# Patient Record
Sex: Female | Born: 1950 | Race: White | Hispanic: No | Marital: Married | Smoking: Never smoker
Health system: Southern US, Community
[De-identification: ages and names within clinical notes are randomized; demographics above are authoritative.]

## PROBLEM LIST (undated history)

## (undated) DIAGNOSIS — K219 Gastro-esophageal reflux disease without esophagitis: Secondary | ICD-10-CM

## (undated) DIAGNOSIS — F419 Anxiety disorder, unspecified: Secondary | ICD-10-CM

## (undated) DIAGNOSIS — H409 Unspecified glaucoma: Secondary | ICD-10-CM

## (undated) DIAGNOSIS — R0602 Shortness of breath: Secondary | ICD-10-CM

## (undated) DIAGNOSIS — E119 Type 2 diabetes mellitus without complications: Secondary | ICD-10-CM

## (undated) DIAGNOSIS — M2550 Pain in unspecified joint: Secondary | ICD-10-CM

## (undated) DIAGNOSIS — K5900 Constipation, unspecified: Secondary | ICD-10-CM

## (undated) DIAGNOSIS — M549 Dorsalgia, unspecified: Secondary | ICD-10-CM

## (undated) DIAGNOSIS — R1310 Dysphagia, unspecified: Secondary | ICD-10-CM

## (undated) DIAGNOSIS — R5383 Other fatigue: Secondary | ICD-10-CM

## (undated) DIAGNOSIS — R002 Palpitations: Secondary | ICD-10-CM

## (undated) DIAGNOSIS — G9332 Myalgic encephalomyelitis/chronic fatigue syndrome: Secondary | ICD-10-CM

## (undated) DIAGNOSIS — R600 Localized edema: Secondary | ICD-10-CM

## (undated) DIAGNOSIS — K589 Irritable bowel syndrome without diarrhea: Secondary | ICD-10-CM

## (undated) DIAGNOSIS — M1990 Unspecified osteoarthritis, unspecified site: Secondary | ICD-10-CM

## (undated) DIAGNOSIS — R5382 Chronic fatigue, unspecified: Secondary | ICD-10-CM

## (undated) HISTORY — DX: Type 2 diabetes mellitus without complications: E11.9

## (undated) HISTORY — DX: Constipation, unspecified: K59.00

## (undated) HISTORY — DX: Localized edema: R60.0

## (undated) HISTORY — DX: Unspecified glaucoma: H40.9

## (undated) HISTORY — DX: Palpitations: R00.2

## (undated) HISTORY — DX: Pain in unspecified joint: M25.50

## (undated) HISTORY — DX: Unspecified osteoarthritis, unspecified site: M19.90

## (undated) HISTORY — DX: Irritable bowel syndrome, unspecified: K58.9

## (undated) HISTORY — DX: Shortness of breath: R06.02

## (undated) HISTORY — DX: Myalgic encephalomyelitis/chronic fatigue syndrome: G93.32

## (undated) HISTORY — DX: Gastro-esophageal reflux disease without esophagitis: K21.9

## (undated) HISTORY — DX: Dysphagia, unspecified: R13.10

## (undated) HISTORY — DX: Anxiety disorder, unspecified: F41.9

## (undated) HISTORY — DX: Dorsalgia, unspecified: M54.9

## (undated) HISTORY — DX: Chronic fatigue, unspecified: R53.82

## (undated) HISTORY — DX: Other fatigue: R53.83

---

## 1988-07-27 HISTORY — PX: GALLBLADDER SURGERY: SHX652

## 1991-02-06 HISTORY — PX: OTHER SURGICAL HISTORY: SHX169

## 2012-04-04 HISTORY — PX: HERNIA REPAIR: SHX51

## 2015-09-09 HISTORY — PX: HERNIA REPAIR: SHX51

## 2017-07-17 ENCOUNTER — Ambulatory Visit (HOSPITAL_BASED_OUTPATIENT_CLINIC_OR_DEPARTMENT_OTHER)
Admission: RE | Admit: 2017-07-17 | Discharge: 2017-07-17 | Disposition: A | Discharge status: Home / Self Care | Payer: 59 | Source: Ambulatory Visit | Attending: Nurse Practitioner | Admitting: Nurse Practitioner

## 2017-07-17 ENCOUNTER — Other Ambulatory Visit (HOSPITAL_BASED_OUTPATIENT_CLINIC_OR_DEPARTMENT_OTHER): Payer: Self-pay | Admitting: Nurse Practitioner

## 2017-07-17 DIAGNOSIS — R600 Localized edema: Secondary | ICD-10-CM | POA: Diagnosis not present

## 2017-07-17 DIAGNOSIS — M79605 Pain in left leg: Secondary | ICD-10-CM | POA: Insufficient documentation

## 2018-03-29 ENCOUNTER — Encounter: Payer: Self-pay | Admitting: Cardiovascular Disease

## 2018-03-29 ENCOUNTER — Ambulatory Visit (INDEPENDENT_AMBULATORY_CARE_PROVIDER_SITE_OTHER): Payer: Medicare Other | Admitting: Cardiovascular Disease

## 2018-03-29 VITALS — BP 152/74 | HR 72 | Ht 64.0 in | Wt 317.0 lb

## 2018-03-29 DIAGNOSIS — I10 Essential (primary) hypertension: Secondary | ICD-10-CM

## 2018-03-29 DIAGNOSIS — R011 Cardiac murmur, unspecified: Secondary | ICD-10-CM | POA: Diagnosis not present

## 2018-03-29 DIAGNOSIS — I6522 Occlusion and stenosis of left carotid artery: Secondary | ICD-10-CM | POA: Diagnosis not present

## 2018-03-29 DIAGNOSIS — E785 Hyperlipidemia, unspecified: Secondary | ICD-10-CM | POA: Diagnosis not present

## 2018-03-29 NOTE — Progress Notes (Signed)
Cardiology Office Note   Date:  03/29/2018   ID:  Asyria, Kolander 12-02-1950, MRN [id number]  PCP:  Hal Morales, NP  Cardiologist:   Lorine Bears, MD   Chief Complaint  Patient presents with  . New Patient (Initial Visit)      History of Present Illness: Bethany Lynch is a 67 y.o. female who was referred by [facility] for evaluation of carotid disease. She has multiple chronic medical conditions that include morbid obesity, diabetes mellitus since she was 67 years old, essential hypertension, hyperlipidemia and chronic leg edema likely due to venous insufficiency.  She reports that her father had coronary and carotid disease but he died of leukemia. She had previous cardiac work-up done for a cardiac murmur and bradycardia many years ago but no recent evaluation.  She also reports having a nuclear stress test in the remote past which was unremarkable. She was noted to have left carotid bruit. Carotid Doppler done earlier this month showed moderate left carotid stenosis estimated to be 50 to 69% with  velocity of 138 over 16 cm/s.  there was minimal disease on the right side. She reports dyspnea with minimal activities and occasional substernal chest tightness.  No previous history of stroke or TIA..    Current Outpatient Medications  Medication Sig Dispense Refill  . dicyclomine (BENTYL) 20 MG tablet Take 1 tablet by mouth 2 (two) times daily.  1  . FARXIGA 10 MG TABS tablet Take 10 mg by mouth daily.  1  . furosemide (LASIX) 20 MG tablet Take 20 mg by mouth daily.  3  . hydrALAZINE (APRESOLINE) 50 MG tablet Take 1 tablet by mouth 3 (three) times daily.  1  . latanoprost (XALATAN) 0.005 % ophthalmic solution Place 1 drop into both eyes at bedtime.    Marland Kitchen LEVEMIR FLEXTOUCH 100 UNIT/ML Pen Inject 80 Units into the skin at bedtime.  1  . LORazepam (ATIVAN) 0.5 MG tablet TAKE 1 TABLET TWICE A DAY AS NEEDED FOR ANXIETY.  2  . losartan (COZAAR) 100 MG tablet Take 100 mg by mouth  daily.  1  . metFORMIN (GLUCOPHAGE) 1000 MG tablet TAKE 1 TABLET TWICE A DAY FOR DIABETES.  2  . NOVOLOG FLEXPEN 100 UNIT/ML FlexPen Inject 22 Units into the muscle daily at 6 PM.  1  . simvastatin (ZOCOR) 10 MG tablet TAKE 1 TABLET EVERY NIGHT AT BEDTIME FOR CHOLESTEROL  1  . verapamil (CALAN-SR) 240 MG CR tablet Take 240 mg by mouth 2 (two) times daily.  2   No current facility-administered medications for this visit.     Allergies:   Patient has no allergy information on record.    Social History:  The patient  reports that she has never smoked. She has never used smokeless tobacco.   Family History:  The patient's family history includes Colon cancer in her mother; Diabetes in her mother; Leukemia in her father; Memory loss in her mother; Stroke in her father.    ROS:  Please see the history of present illness.   Otherwise, review of systems are positive for none.   All other systems are reviewed and negative.    PHYSICAL EXAM: VS:  BP (!) 152/74   Pulse 72   Ht  (1.626 m)   Wt (!) 317 lb (143.8 kg)   BMI 54.41 kg/m  , BMI Body mass index is 54.41 kg/m. GEN: Well nourished, well developed, in no acute distress  HEENT: normal  Neck: no JVD,  or masses.  Left carotid bruit  Cardiac: RRR; no  rubs, or gallops.  2 out of 6 crescendo decrescendo systolic murmur in the aortic area which is early peaking.  Moderate bilateral leg edema. Respiratory:  clear to auscultation bilaterally, normal work of breathing GI: soft, nontender, nondistended, + BS MS: no deformity or atrophy  Skin: warm and dry, no rash Neuro:  Strength and sensation are intact Psych: euthymic mood, full affect   EKG:  EKG is ordered today. The ekg ordered today demonstrates sinus rhythm, LVH with repolarization abnormalities.   Recent Labs: No results found for requested labs within last 8760 hours.    Lipid Panel No results found for: CHOL, TRIG, HDL, CHOLHDL, VLDL, LDLCALC, LDLDIRECT    Wt  Readings from Last 3 Encounters:  03/29/18 (!) 317 lb (143.8 kg)       PAD Screen 03/29/2018  Previous PAD dx? No  Previous surgical procedure? No  Pain with walking? Yes  Subsides with rest? No  Feet/toe relief with dangling? No  Painful, non-healing ulcers? No  Extremities discolored? No      ASSESSMENT AND PLAN:  1.  Moderate left carotid stenosis: Asymptomatic.  I recommend aggressive treatment of risk factors and low-dose aspirin.  Revascularization might be indicated once stenosis is above 80%.  I am going to repeat carotid Doppler in our office in 1 year and monitor this on a yearly basis.  2.  Cardiac murmur suggestive of aortic stenosis with no recent evaluation.  I requested an echocardiogram.  The patient has significant exertional dyspnea which I suspect is partially due to physical deconditioning.  3.  Essential hypertension: Blood pressure is not optimally controlled.  Can consider adding spironolactone if she has no issues with chronic kidney disease or hyperkalemia.  4.  Hyperlipidemia: Given diabetes and carotid disease, I recommend a target LDL of less than 70.  Due to interaction with verapamil, simvastatin cannot be increased and thus should consider switching to rosuvastatin or atorvastatin.    Disposition:   FU with me in 1 year  Signed,  Lorine Bears, MD  03/29/2018 10:57 AM    [facility]

## 2018-03-29 NOTE — Patient Instructions (Addendum)
Medication Instructions: Your physician recommends that you continue on your current medications as directed. Please refer to the Current Medication list given to you today.  If you need a refill on your cardiac medications before your next appointment, please call your pharmacy.   Procedures/Testing: Your physician has requested that you have an echocardiogram in the next few weeks. Echocardiography is a painless test that uses sound waves to create images of your heart. It provides your doctor with information about the size and shape of your heart and how well your heart's chambers and valves are working. This procedure takes approximately one hour. There are no restrictions for this procedure. This will take place at [address].  Your physician has requested that you have a carotid duplex in 12 months. This test is an ultrasound of the carotid arteries in your neck. It looks at blood flow through these arteries that supply the brain with blood. Allow one hour for this exam. There are no restrictions or special instructions. This will take place at [address], [address].    Follow-Up: Your physician wants you to follow-up in 12 months with Dr. Kirke Corin. You will receive a reminder letter in the mail two months in advance. If you don't receive a letter, please call our office at [phone] to schedule this follow-up appointment.    Thank you for choosing Heart[facility] at [facility]!!

## 2018-04-06 ENCOUNTER — Ambulatory Visit (HOSPITAL_COMMUNITY): Payer: Medicare Other | Attending: Cardiology

## 2018-04-06 ENCOUNTER — Other Ambulatory Visit: Payer: Self-pay

## 2018-04-06 DIAGNOSIS — I119 Hypertensive heart disease without heart failure: Secondary | ICD-10-CM | POA: Diagnosis not present

## 2018-04-06 DIAGNOSIS — R011 Cardiac murmur, unspecified: Secondary | ICD-10-CM | POA: Insufficient documentation

## 2018-04-06 DIAGNOSIS — E785 Hyperlipidemia, unspecified: Secondary | ICD-10-CM | POA: Diagnosis not present

## 2018-04-06 DIAGNOSIS — I350 Nonrheumatic aortic (valve) stenosis: Secondary | ICD-10-CM | POA: Diagnosis not present

## 2018-04-06 DIAGNOSIS — E119 Type 2 diabetes mellitus without complications: Secondary | ICD-10-CM | POA: Diagnosis not present

## 2018-09-28 ENCOUNTER — Ambulatory Visit (INDEPENDENT_AMBULATORY_CARE_PROVIDER_SITE_OTHER): Payer: Medicare Other | Admitting: Cardiology

## 2018-09-28 ENCOUNTER — Encounter: Payer: Self-pay | Admitting: Cardiology

## 2018-09-28 VITALS — BP 146/74 | HR 86 | Ht 64.0 in | Wt 321.4 lb

## 2018-09-28 DIAGNOSIS — I152 Hypertension secondary to endocrine disorders: Secondary | ICD-10-CM | POA: Insufficient documentation

## 2018-09-28 DIAGNOSIS — R0602 Shortness of breath: Secondary | ICD-10-CM | POA: Insufficient documentation

## 2018-09-28 DIAGNOSIS — I6522 Occlusion and stenosis of left carotid artery: Secondary | ICD-10-CM

## 2018-09-28 DIAGNOSIS — R011 Cardiac murmur, unspecified: Secondary | ICD-10-CM | POA: Diagnosis not present

## 2018-09-28 DIAGNOSIS — I10 Essential (primary) hypertension: Secondary | ICD-10-CM

## 2018-09-28 DIAGNOSIS — R0609 Other forms of dyspnea: Secondary | ICD-10-CM

## 2018-09-28 DIAGNOSIS — E041 Nontoxic single thyroid nodule: Secondary | ICD-10-CM

## 2018-09-28 DIAGNOSIS — E1159 Type 2 diabetes mellitus with other circulatory complications: Secondary | ICD-10-CM | POA: Insufficient documentation

## 2018-09-28 DIAGNOSIS — I350 Nonrheumatic aortic (valve) stenosis: Secondary | ICD-10-CM

## 2018-09-28 DIAGNOSIS — E785 Hyperlipidemia, unspecified: Secondary | ICD-10-CM

## 2018-09-28 HISTORY — DX: Essential (primary) hypertension: I10

## 2018-09-28 HISTORY — DX: Shortness of breath: R06.02

## 2018-09-28 HISTORY — DX: Hyperlipidemia, unspecified: E78.5

## 2018-09-28 HISTORY — DX: Occlusion and stenosis of left carotid artery: I65.22

## 2018-09-28 NOTE — Patient Instructions (Signed)
Medication Instructions:  Your physician recommends that you continue on your current medications as directed. Please refer to the Current Medication list given to you today.  If you need a refill on your cardiac medications before your next appointment, please call your pharmacy.   Lab work: Your physician recommends that you return for lab work today: BMP, TSH, BNP   If you have labs (blood work) drawn today and your tests are completely normal, you will receive your results only by: Marland Kitchen MyChart Message (if you have MyChart) OR . A paper copy in the mail If you have any lab test that is abnormal or we need to change your treatment, we will call you to review the results.  Testing/Procedures: Your physician has requested that you have a carotid duplex. This test is an ultrasound of the carotid arteries in your neck. It looks at blood flow through these arteries that supply the brain with blood. Allow one hour for this exam. There are no restrictions or special instructions.    Follow-Up: At [facility], you and your health needs are our priority.  As part of our continuing mission to provide you with exceptional heart care, we have created designated Provider Care Teams.  These Care Teams include your primary Cardiologist (physician) and Advanced Practice Providers (APPs -  Physician Assistants and Nurse Practitioners) who all work together to provide you with the care you need, when you need it. You will need a follow up appointment in 3 months.  Please call our office 2 months in advance to schedule this appointment.  You may see No primary care provider on file. or another member of our [employer] Provider Team in [place]: Norman Herrlich, MD . Belva Crome, MD  Any Other Special Instructions Will Be Listed Below (If Applicable).

## 2018-09-28 NOTE — Progress Notes (Signed)
Cardiology Office Note:    Date:  09/28/2018   ID:  Bethany Lynch, DOB 02/06/1951, MRN [id number]  PCP:  Hal Morales, NP  Cardiologist:  Gypsy Balsam, MD    Referring MD: Hal Morales, NP   Chief Complaint  Patient presents with  . Follow-up  I have shortness of breath  History of Present Illness:    Bethany Lynch is a 67 y.o. female that I had a pleasure to take her a few years ago.  Her past medical history is quite complex.  She does have diabetes also morbid obesity lymphedema dyspnea on exertion chief complaint this time is shortness of breath she said anytime she tried to do think she will get short of breath very easily.  In the spring time she did see our partner Dr. Benard Rink with consideration of carotid arterial disease no intervention was required echocardiogram was ordered time she was find to have mild aortic stenosis normal left ventricular ejection fraction.  She does have swelling of lower extremities which majority of it is lymphedema she is being helpful for that with some massage therapy.  That happens about once a month and it helps a great deal.  No past medical history on file.  Past Surgical History:  Procedure Laterality Date  . GALLBLADDER SURGERY  07/27/1988  . HERNIA REPAIR  04/04/2012  . HERNIA REPAIR  09/09/2015  . HYESTERECTOMY  02/06/1991    Current Medications: Current Meds  Medication Sig  . aspirin 81 MG chewable tablet Chew by mouth daily.  . B Complex-C (B-COMPLEX WITH VITAMIN C) tablet Take 1 tablet by mouth daily.  . cetirizine (ZYRTEC ALLERGY) 10 MG tablet Take 10 mg by mouth daily.  . Cholecalciferol (D3 HIGH POTENCY) 2000 units CAPS Take by mouth daily as needed.  . Cranberry 200 MG CAPS Take 1 capsule by mouth daily as needed.  . dicyclomine (BENTYL) 20 MG tablet Take 1 tablet by mouth 2 (two) times daily.  Marland Kitchen FARXIGA 10 MG TABS tablet Take 10 mg by mouth daily.  . furosemide (LASIX) 20 MG tablet Take 20 mg by mouth daily.  .  Homeopathic Products (YEAST-GARD ADV HOMEOPATHIC PO) Take by mouth as needed.  . hydrALAZINE (APRESOLINE) 50 MG tablet Take 1 tablet by mouth 3 (three) times daily.  Marland Kitchen latanoprost (XALATAN) 0.005 % ophthalmic solution Place 1 drop into both eyes at bedtime.  Marland Kitchen LORazepam (ATIVAN) 0.5 MG tablet TAKE 1 TABLET TWICE A DAY AS NEEDED FOR ANXIETY.  Marland Kitchen losartan (COZAAR) 100 MG tablet Take 100 mg by mouth daily.  . metFORMIN (GLUCOPHAGE) 1000 MG tablet TAKE 1 TABLET TWICE A DAY FOR DIABETES.  . Misc Natural Products (TART CHERRY ADVANCED PO) Take by mouth daily as needed.  . Multiple Vitamins-Minerals (VISION PLUS PO) Take by mouth daily as needed.  . NON FORMULARY ORGANIC TUMERIC CURCUMIN  WITH GINGER AND BIOPERINE DIETARY SUPPLEMENT TAKE AS NEEDED  . NOVOLOG FLEXPEN 100 UNIT/ML FlexPen Inject 22 Units into the muscle daily at 6 PM.  . Pediatric Multivitamins-Fl (MULTIVITAMIN DROPS/FLUORIDE PO) Take 1 tablet by mouth daily.  . simvastatin (ZOCOR) 10 MG tablet TAKE 1 TABLET EVERY NIGHT AT BEDTIME FOR CHOLESTEROL  . verapamil (CALAN-SR) 240 MG CR tablet Take 240 mg by mouth 2 (two) times daily.  . vitamin C (ASCORBIC ACID) 500 MG tablet Take 500 mg by mouth daily.  . vitamin E 400 UNIT capsule Take 400 Units by mouth daily.     Allergies:   Victoza [liraglutide]  Social History   Socioeconomic History  . Marital status: Married    Spouse name: Not on file  . Number of children: Not on file  . Years of education: Not on file  . Highest education level: Not on file  Occupational History  . Not on file  Social Needs  . Financial resource strain: Not on file  . Food insecurity:    Worry: Not on file    Inability: Not on file  . Transportation needs:    Medical: Not on file    Non-medical: Not on file  Tobacco Use  . Smoking status: Never Smoker  . Smokeless tobacco: Never Used  Substance and Sexual Activity  . Alcohol use: Not on file  . Drug use: Not on file  . Sexual activity: Not on  file  Lifestyle  . Physical activity:    Days per week: Not on file    Minutes per session: Not on file  . Stress: Not on file  Relationships  . Social connections:    Talks on phone: Not on file    Gets together: Not on file    Attends religious service: Not on file    Active member of club or organization: Not on file    Attends meetings of clubs or organizations: Not on file    Relationship status: Not on file  Other Topics Concern  . Not on file  Social History Narrative  . Not on file     Family History: The patient's family history includes Colon cancer in her mother; Diabetes in her mother; Leukemia in her father; Memory loss in her mother; Stroke in her father. ROS:   Please see the history of present illness.    All 14 point review of systems negative except as described per history of present illness  EKGs/Labs/Other Studies Reviewed:      Recent Labs: No results found for requested labs within last 8760 hours.  Recent Lipid Panel No results found for: CHOL, TRIG, HDL, CHOLHDL, VLDL, LDLCALC, LDLDIRECT  Physical Exam:    VS:  BP (!) 146/74   Pulse 86   Ht 5\' 4"  (1.626 m)   Wt (!) 321 lb 6.4 oz (145.8 kg)   SpO2 97%   BMI 55.17 kg/m     Wt Readings from Last 3 Encounters:  09/28/18 (!) 321 lb 6.4 oz (145.8 kg)  03/29/18 (!) 317 lb (143.8 kg)     GEN:  Well nourished, well developed in no acute distress HEENT: Normal NECK: No JVD; No carotid bruits LYMPHATICS: No lymphadenopathy CARDIAC: RRR, systolic ejection murmur grade 1/6 to 2/6 best heard at the right upper portion of the sternum, no rubs, no gallops RESPIRATORY:  Clear to auscultation without rales, wheezing or rhonchi  ABDOMEN: Soft, non-tender, non-distended MUSCULOSKELETAL:  No edema; No deformity  SKIN: Warm and dry LOWER EXTREMITIES: no swelling NEUROLOGIC:  Alert and oriented x 3 PSYCHIATRIC:  Normal affect   ASSESSMENT:    1. Essential hypertension   2. Hyperlipidemia,  unspecified hyperlipidemia type   3. Stenosis of left carotid artery   4. Murmur   5. Dyspnea on exertion   6. Thyroid nodule   7. Shortness of breath   8. Nonrheumatic aortic valve stenosis    PLAN:    In order of problems listed above:  1. Essential hypertension blood pressure appears to be controlled continue present management. 2. Dyspnea on exertion undoubtedly multifactorial her left ventricular ejection fraction showed preserved left ventricular ejection  fraction but I will ask you to have proBNP as well as Chem-7 done try to assess the situation and see if there is anything I can help with giving her some diuretic. 3. Nonrheumatic aortic stenosis only mild insignificant for now obviously required.  Recheck by echocardiogram. 4. Chronic lymphedema has been followed by massage therapy here and aspirin and it seems to be helping a great deal she also use elastic stockings which helps. 5. Dyslipidemia she takes small dose of simvastatin only 10 mg this is not sufficient I will switch her to Crestor 20 mg daily.   Medication Adjustments/Labs and Tests Ordered: Current medicines are reviewed at length with the patient today.  Concerns regarding medicines are outlined above.  Orders Placed This Encounter  Procedures  . Basic metabolic panel  . TSH  . Pro b natriuretic peptide (BNP)   Medication changes: No orders of the defined types were placed in this encounter.   Signed, Georgeanna Lea, MD, [facility] 09/28/2018 12:06 PM    [facility]

## 2018-09-29 LAB — BASIC METABOLIC PANEL
BUN/Creatinine Ratio: 25 (ref 12–28)
BUN: 19 mg/dL (ref 8–27)
CO2: 26 mmol/L (ref 20–29)
Calcium: 10.6 mg/dL — ABNORMAL HIGH (ref 8.7–10.3)
Chloride: 98 mmol/L (ref 96–106)
Creatinine, Ser: 0.75 mg/dL (ref 0.57–1.00)
GFR calc Af Amer: 97 mL/min/{1.73_m2} (ref >59)
GFR calc non Af Amer: 84 mL/min/{1.73_m2} (ref >59)
GLUCOSE: 91 mg/dL (ref 65–99)
Potassium: 4.8 mmol/L (ref 3.5–5.2)
SODIUM: 141 mmol/L (ref 134–144)

## 2018-09-29 LAB — TSH: TSH: 2.83 u[IU]/mL (ref 0.450–4.500)

## 2018-09-29 LAB — PRO B NATRIURETIC PEPTIDE: NT-PRO BNP: 79 pg/mL (ref 0–301)

## 2018-10-03 ENCOUNTER — Ambulatory Visit (INDEPENDENT_AMBULATORY_CARE_PROVIDER_SITE_OTHER): Payer: Medicare Other

## 2018-10-03 DIAGNOSIS — I10 Essential (primary) hypertension: Secondary | ICD-10-CM

## 2018-10-03 DIAGNOSIS — E785 Hyperlipidemia, unspecified: Secondary | ICD-10-CM

## 2018-10-03 DIAGNOSIS — R011 Cardiac murmur, unspecified: Secondary | ICD-10-CM | POA: Diagnosis not present

## 2018-10-03 DIAGNOSIS — I6522 Occlusion and stenosis of left carotid artery: Secondary | ICD-10-CM

## 2018-10-03 NOTE — Progress Notes (Signed)
Carotid artery duplex exam has been performed. Increased velocities were seen in the common carotid and subclavian arteries bilaterally. Mild left ICA stenosis was seen.  Jimmy Niasia Lanphear RDCS, RVT

## 2018-10-04 ENCOUNTER — Telehealth: Payer: Self-pay | Admitting: Emergency Medicine

## 2018-10-04 ENCOUNTER — Telehealth: Payer: Self-pay

## 2018-10-04 DIAGNOSIS — R0602 Shortness of breath: Secondary | ICD-10-CM

## 2018-10-04 DIAGNOSIS — I10 Essential (primary) hypertension: Secondary | ICD-10-CM

## 2018-10-04 MED ORDER — POTASSIUM CHLORIDE ER 10 MEQ PO TBCR: 10.0000 meq | EXTENDED_RELEASE_TABLET | Freq: Every day | ORAL | 0 refills | Status: DC

## 2018-10-04 MED ORDER — FUROSEMIDE 40 MG PO TABS: 40.0000 mg | ORAL_TABLET | Freq: Every day | ORAL | 0 refills | Status: DC

## 2018-10-04 NOTE — Telephone Encounter (Signed)
Patient returned call for lab results at which results were given and she was notified to increase her furosemide to 40 mg and to start potassium 10 meq. She will come by the office to have repeat blood work in a week.  She was also questioning if her Simvastatin was going to be changed to crestor as discussed at her office visit. I advised I would call her back and let her know.

## 2018-10-04 NOTE — Telephone Encounter (Signed)
Patient informed of results and medication changes per Carollee Sires. CMA

## 2018-10-04 NOTE — Telephone Encounter (Signed)
Left message for patient to return call.

## 2018-10-05 MED ORDER — ROSUVASTATIN CALCIUM 10 MG PO TABS: 10.0000 mg | ORAL_TABLET | Freq: Every day | ORAL | 1 refills | Status: DC

## 2018-10-05 NOTE — Telephone Encounter (Signed)
Yes, switch simva to crestor 10 mg po qd

## 2018-10-05 NOTE — Telephone Encounter (Signed)
Patient notified of changing Simvastatin to Crestor.

## 2018-10-07 ENCOUNTER — Telehealth: Payer: Self-pay | Admitting: Emergency Medicine

## 2018-10-07 NOTE — Telephone Encounter (Signed)
Left message for patient to return call regarding results  

## 2018-10-18 ENCOUNTER — Other Ambulatory Visit: Payer: Self-pay

## 2018-10-18 ENCOUNTER — Telehealth: Payer: Self-pay | Admitting: Emergency Medicine

## 2018-10-18 DIAGNOSIS — I10 Essential (primary) hypertension: Secondary | ICD-10-CM

## 2018-10-18 DIAGNOSIS — R0602 Shortness of breath: Secondary | ICD-10-CM

## 2018-10-18 NOTE — Telephone Encounter (Signed)
Patient reminded to have lab work drawn, she states she will come today.

## 2018-10-19 LAB — BASIC METABOLIC PANEL
BUN/Creatinine Ratio: 34 — ABNORMAL HIGH (ref 12–28)
BUN: 26 mg/dL (ref 8–27)
CALCIUM: 10 mg/dL (ref 8.7–10.3)
CHLORIDE: 100 mmol/L (ref 96–106)
CO2: 21 mmol/L (ref 20–29)
Creatinine, Ser: 0.76 mg/dL (ref 0.57–1.00)
GFR calc non Af Amer: 83 mL/min/{1.73_m2} (ref >59)
GFR, EST AFRICAN AMERICAN: 95 mL/min/{1.73_m2} (ref >59)
GLUCOSE: 130 mg/dL — AB (ref 65–99)
Potassium: 4.6 mmol/L (ref 3.5–5.2)
Sodium: 140 mmol/L (ref 134–144)

## 2018-10-20 ENCOUNTER — Telehealth: Payer: Self-pay | Admitting: Emergency Medicine

## 2018-10-20 DIAGNOSIS — I10 Essential (primary) hypertension: Secondary | ICD-10-CM

## 2018-10-20 NOTE — Telephone Encounter (Signed)
Patient informed of lab results and to have labs redrawn in one week. She verbally understands

## 2018-11-02 ENCOUNTER — Encounter: Payer: Self-pay | Admitting: Cardiology

## 2018-12-09 ENCOUNTER — Other Ambulatory Visit: Payer: Self-pay | Admitting: Cardiology

## 2019-01-02 ENCOUNTER — Encounter: Payer: Self-pay | Admitting: Cardiology

## 2019-01-02 ENCOUNTER — Ambulatory Visit (INDEPENDENT_AMBULATORY_CARE_PROVIDER_SITE_OTHER): Payer: Medicare Other | Admitting: Cardiology

## 2019-01-02 VITALS — BP 122/70 | HR 68 | Ht 64.0 in | Wt 321.8 lb

## 2019-01-02 DIAGNOSIS — I890 Lymphedema, not elsewhere classified: Secondary | ICD-10-CM

## 2019-01-02 DIAGNOSIS — E782 Mixed hyperlipidemia: Secondary | ICD-10-CM | POA: Diagnosis not present

## 2019-01-02 DIAGNOSIS — I6522 Occlusion and stenosis of left carotid artery: Secondary | ICD-10-CM

## 2019-01-02 DIAGNOSIS — R0602 Shortness of breath: Secondary | ICD-10-CM | POA: Diagnosis not present

## 2019-01-02 DIAGNOSIS — I10 Essential (primary) hypertension: Secondary | ICD-10-CM | POA: Diagnosis not present

## 2019-01-02 HISTORY — DX: Lymphedema, not elsewhere classified: I89.0

## 2019-01-02 NOTE — Patient Instructions (Signed)
Medication Instructions:  Your physician recommends that you continue on your current medications as directed. Please refer to the Current Medication list given to you today.  If you need a refill on your cardiac medications before your next appointment, please call your pharmacy.   Lab work: None.  If you have labs (blood work) drawn today and your tests are completely normal, you will receive your results only by: . MyChart Message (if you have MyChart) OR . A paper copy in the mail If you have any lab test that is abnormal or we need to change your treatment, we will call you to review the results.  Testing/Procedures: None.   Follow-Up: At [facility], you and your health needs are our priority.  As part of our continuing mission to provide you with exceptional heart care, we have created designated Provider Care Teams.  These Care Teams include your primary Cardiologist (physician) and Advanced Practice Providers (APPs -  Physician Assistants and Nurse Practitioners) who all work together to provide you with the care you need, when you need it. You will need a follow up appointment in 5 months.  Please call our office 2 months in advance to schedule this appointment.  You may see No primary care provider on file. or another member of our [facility] Provider Team in [place]: Brian Munley, MD . Rajan Revankar, MD  Any Other Special Instructions Will Be Listed Below (If Applicable).    

## 2019-01-02 NOTE — Progress Notes (Signed)
Cardiology Office Note:    Date:  01/02/2019   ID:  Bethany Lynch, DOB 02-14-1951, MRN [id number]  PCP:  Hal Morales, NP  Cardiologist:  Gypsy Balsam, MD    Referring MD: Hal Morales, NP   Chief Complaint  Patient presents with  . Follow-up  Doing well  History of Present Illness:    Bethany Lynch is a 68 y.o. female doing well cardiac wise denies have any chest pain tightness squeezing pressure burning chest.  Still had swelling of lower extremities this is lymphedema she gets massage once a month which helps tremendously.  Denies having chest pain tightness squeezing pressure mentions does complain of having some headaches sometimes she does have glaucoma she is scheduled to see her eye doctor within the next week and I strongly recommend to continue with follow-up she need to have her pressure in her eye checked.  She checked her blood pressure when she was having headache and blood pressure was normal 118/60.  Her hemoglobin A1c improved last #6.3.  There is some changes in her medication which seems to be helping.  Last fasting lipid profile I have is from November and is very good.  No past medical history on file.  Past Surgical History:  Procedure Laterality Date  . GALLBLADDER SURGERY  07/27/1988  . HERNIA REPAIR  04/04/2012  . HERNIA REPAIR  09/09/2015  . HYESTERECTOMY  02/06/1991    Current Medications: Current Meds  Medication Sig  . aspirin 81 MG chewable tablet Chew by mouth daily.  . B Complex-C (B-COMPLEX WITH VITAMIN C) tablet Take 1 tablet by mouth daily.  . cetirizine (ZYRTEC ALLERGY) 10 MG tablet Take 10 mg by mouth daily.  . Cholecalciferol (D3 HIGH POTENCY) 2000 units CAPS Take by mouth daily as needed.  . Cranberry 200 MG CAPS Take 1 capsule by mouth daily as needed.  . dicyclomine (BENTYL) 20 MG tablet Take 1 tablet by mouth 2 (two) times daily.  Marland Kitchen FARXIGA 10 MG TABS tablet Take 10 mg by mouth daily.  . furosemide (LASIX) 40 MG tablet Take 1  tablet (40 mg total) by mouth daily.  . Homeopathic Products (YEAST-GARD ADV HOMEOPATHIC PO) Take by mouth as needed.  . hydrALAZINE (APRESOLINE) 50 MG tablet Take 1 tablet by mouth 3 (three) times daily.  Marland Kitchen latanoprost (XALATAN) 0.005 % ophthalmic solution Place 1 drop into both eyes at bedtime.  Marland Kitchen LORazepam (ATIVAN) 0.5 MG tablet TAKE 1 TABLET TWICE A DAY AS NEEDED FOR ANXIETY.  Marland Kitchen losartan (COZAAR) 100 MG tablet Take 100 mg by mouth daily.  . metFORMIN (GLUCOPHAGE) 1000 MG tablet TAKE 1 TABLET TWICE A DAY FOR DIABETES.  . Misc Natural Products (TART CHERRY ADVANCED PO) Take by mouth daily as needed.  . Multiple Vitamins-Minerals (VISION PLUS PO) Take by mouth daily as needed.  . NON FORMULARY ORGANIC TUMERIC CURCUMIN  WITH GINGER AND BIOPERINE DIETARY SUPPLEMENT TAKE AS NEEDED  . NOVOLOG FLEXPEN 100 UNIT/ML FlexPen Inject 22 Units into the muscle 3 (three) times daily with meals.   . Pediatric Multivitamins-Fl (MULTIVITAMIN DROPS/FLUORIDE PO) Take 1 tablet by mouth daily.  . potassium chloride (K-DUR) 10 MEQ tablet TAKE 1 TABLET BY MOUTH EVERY DAY  . rosuvastatin (CRESTOR) 10 MG tablet Take 1 tablet (10 mg total) by mouth daily.  . TRESIBA FLEXTOUCH 200 UNIT/ML SOPN Inject 80 Units into the skin at bedtime.  . verapamil (CALAN-SR) 240 MG CR tablet Take 240 mg by mouth 2 (two) times daily.  Marland Kitchen  vitamin C (ASCORBIC ACID) 500 MG tablet Take 500 mg by mouth daily.  . vitamin E 400 UNIT capsule Take 400 Units by mouth daily.     Allergies:   Victoza [liraglutide]   Social History   Socioeconomic History  . Marital status: Married    Spouse name: Not on file  . Number of children: Not on file  . Years of education: Not on file  . Highest education level: Not on file  Occupational History  . Not on file  Social Needs  . Financial resource strain: Not on file  . Food insecurity:    Worry: Not on file    Inability: Not on file  . Transportation needs:    Medical: Not on file     Non-medical: Not on file  Tobacco Use  . Smoking status: Never Smoker  . Smokeless tobacco: Never Used  Substance and Sexual Activity  . Alcohol use: Not on file  . Drug use: Not on file  . Sexual activity: Not on file  Lifestyle  . Physical activity:    Days per week: Not on file    Minutes per session: Not on file  . Stress: Not on file  Relationships  . Social connections:    Talks on phone: Not on file    Gets together: Not on file    Attends religious service: Not on file    Active member of club or organization: Not on file    Attends meetings of clubs or organizations: Not on file    Relationship status: Not on file  Other Topics Concern  . Not on file  Social History Narrative  . Not on file     Family History: The patient's family history includes Colon cancer in her mother; Diabetes in her mother; Leukemia in her father; Memory loss in her mother; Stroke in her father. ROS:   Please see the history of present illness.    All 14 point review of systems negative except as described per history of present illness  EKGs/Labs/Other Studies Reviewed:      Recent Labs: 09/28/2018: NT-Pro BNP 79; TSH 2.830 10/18/2018: BUN 26; Creatinine, Ser 0.76; Potassium 4.6; Sodium 140  Recent Lipid Panel No results found for: CHOL, TRIG, HDL, CHOLHDL, VLDL, LDLCALC, LDLDIRECT  Physical Exam:    VS:  BP 122/70   Pulse 68   Ht 5\' 4"  (1.626 m)   Wt (!) 321 lb 12.8 oz (146 kg)   SpO2 98%   BMI 55.24 kg/m     Wt Readings from Last 3 Encounters:  01/02/19 (!) 321 lb 12.8 oz (146 kg)  09/28/18 (!) 321 lb 6.4 oz (145.8 kg)  03/29/18 (!) 317 lb (143.8 kg)     GEN:  Well nourished, well developed in no acute distress HEENT: Normal NECK: No JVD; No carotid bruits LYMPHATICS: No lymphadenopathy CARDIAC: RRR, no murmurs, no rubs, no gallops RESPIRATORY:  Clear to auscultation without rales, wheezing or rhonchi  ABDOMEN: Soft, non-tender, non-distended MUSCULOSKELETAL:  No  edema; No deformity  SKIN: Warm and dry LOWER EXTREMITIES: no swelling NEUROLOGIC:  Alert and oriented x 3 PSYCHIATRIC:  Normal affect   ASSESSMENT:    1. Stenosis of left carotid artery   2. Essential hypertension   3. Shortness of breath   4. Mixed hyperlipidemia   5. Lymphedema of both lower extremities    PLAN:    In order of problems listed above:  1. Stenosis of the left carotid artery recent  carotid ultrasounds did not show any critical lesions we will continue risk factors modifications. 2. Essential hypertension blood pressure well controlled we will continue present management. 3. Shortness of breath last echocardiogram showed preserved left ventricular ejection fraction.  Clearly a part of the problem is obesity.  I strongly encouraged her to exercise on a regular basis which she will try to do. 4. Dyslipidemia last fasting improvement lipid profile good 5. Lymphedema follow-up by internal medicine.  She does have massage of her lower extremities and doing well from that point review   Medication Adjustments/Labs and Tests Ordered: Current medicines are reviewed at length with the patient today.  Concerns regarding medicines are outlined above.  No orders of the defined types were placed in this encounter.  Medication changes: No orders of the defined types were placed in this encounter.   Signed, Georgeanna Lea, MD, [facility] 01/02/2019 9:51 AM    [facility]

## 2019-03-04 ENCOUNTER — Other Ambulatory Visit: Payer: Self-pay | Admitting: Cardiology

## 2019-06-05 ENCOUNTER — Other Ambulatory Visit: Payer: Self-pay | Admitting: Cardiology

## 2019-06-05 NOTE — Telephone Encounter (Signed)
°*  STAT* If patient is at the pharmacy, call can be transferred to refill team.   1. Which medications need to be refilled? (please list name of each medication and dose if known) potassium cl er 45meq tablet  2. Which pharmacy/location (including street and city if local pharmacy) is medication to be sent to? [facility] [address] [place]  3. Do they need a 30 day or 90 day supply? [place]

## 2019-06-06 MED ORDER — POTASSIUM CHLORIDE ER 10 MEQ PO TBCR: 10.0000 meq | EXTENDED_RELEASE_TABLET | Freq: Every day | ORAL | 0 refills | Status: DC

## 2019-06-06 NOTE — Telephone Encounter (Signed)
[facility] ref[facility] to [facility] [address] [place]

## 2019-06-13 ENCOUNTER — Telehealth: Payer: Self-pay | Admitting: *Deleted

## 2019-06-13 NOTE — Telephone Encounter (Signed)
A message was left, re: follow up visit. 

## 2019-06-15 IMAGING — US US EXTREM LOW VENOUS*L*
1 series · 13 of 24 positions shown · non-contrast
Comparison: None.

CLINICAL DATA: Palpable lump left lower leg.



[Series 1: us extrem low venous*left* · 0.11mm/px · 13 of 40 slices shown]
[im 1/40]
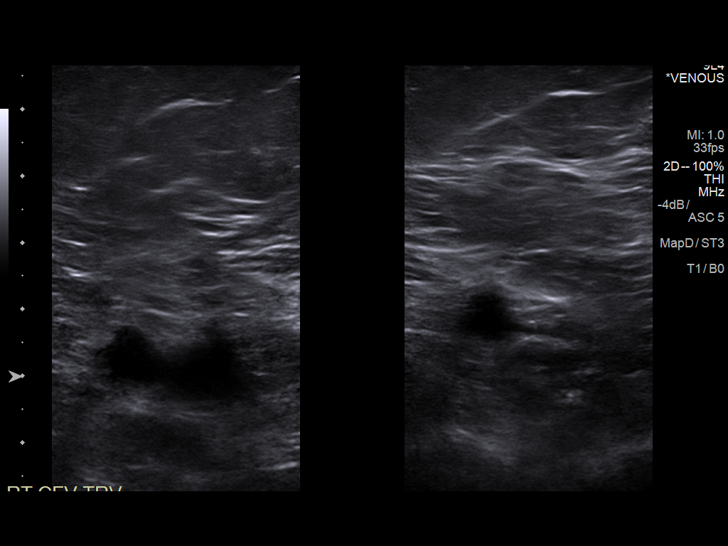
[im 4/40]
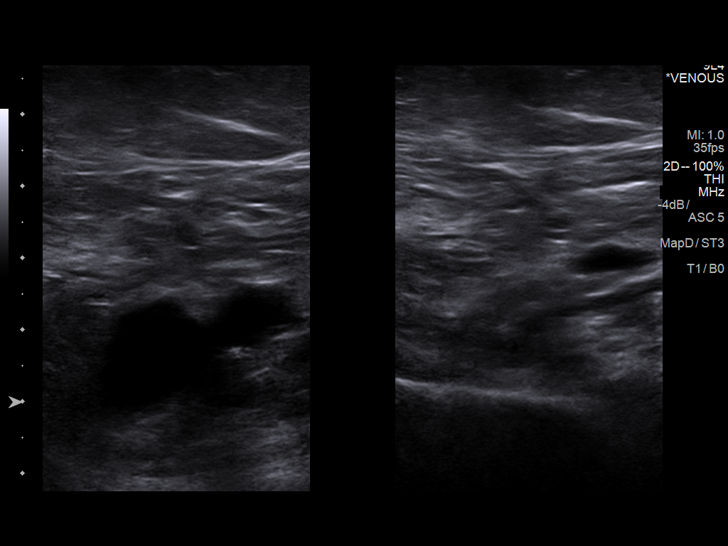
[im 7/40]
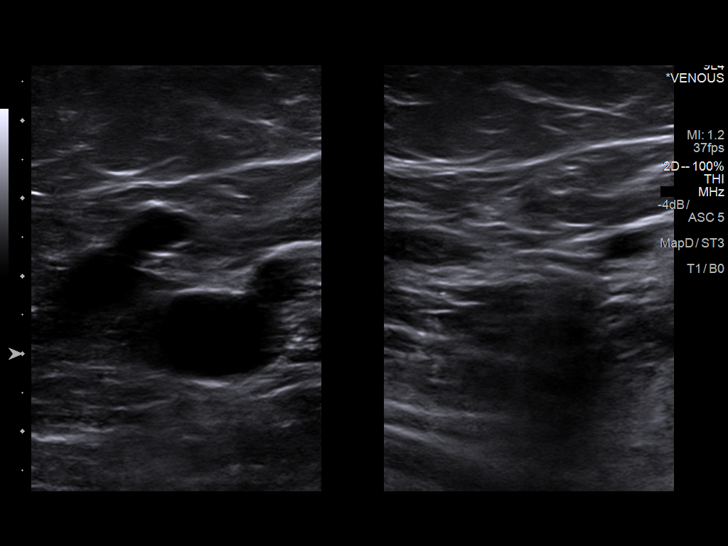
[im 11/40]
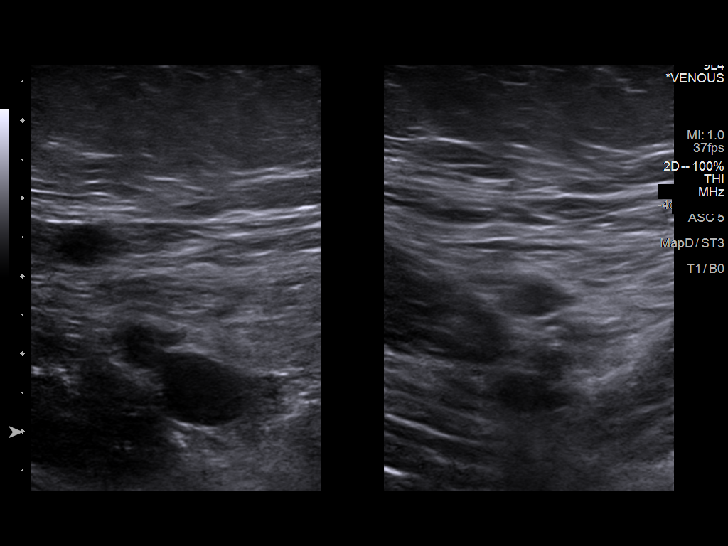
[im 14/40]
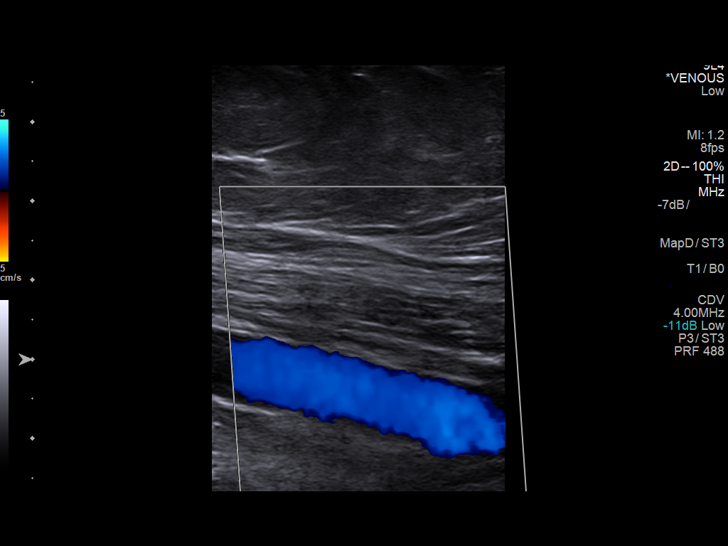
[im 17/40]
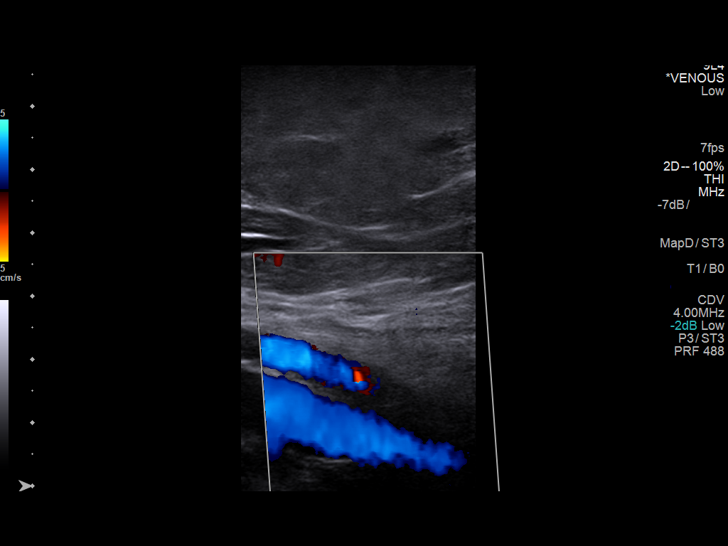
[im 21/40]
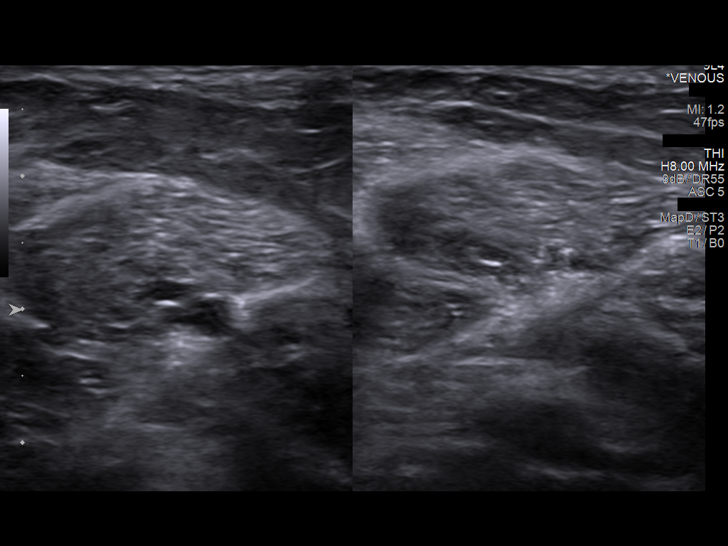
[im 23/40]
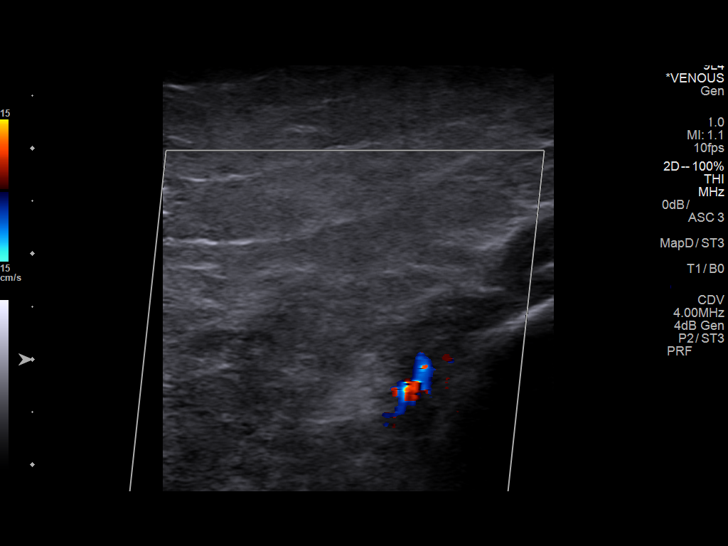
[im 26/40]
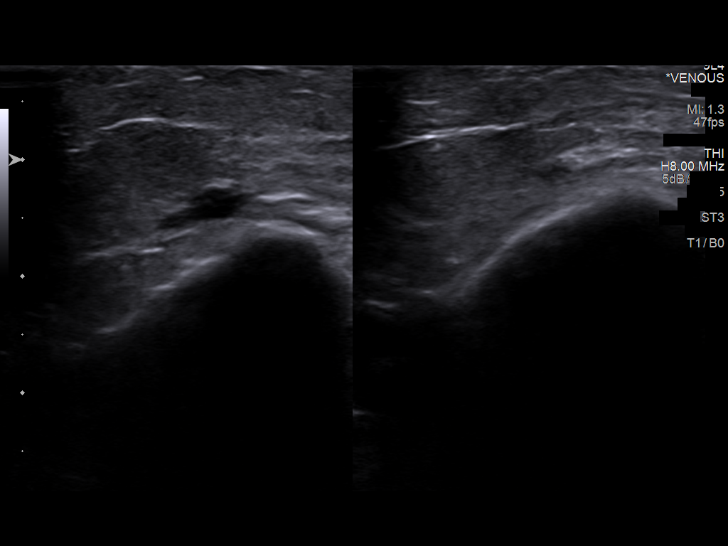
[im 29/40]
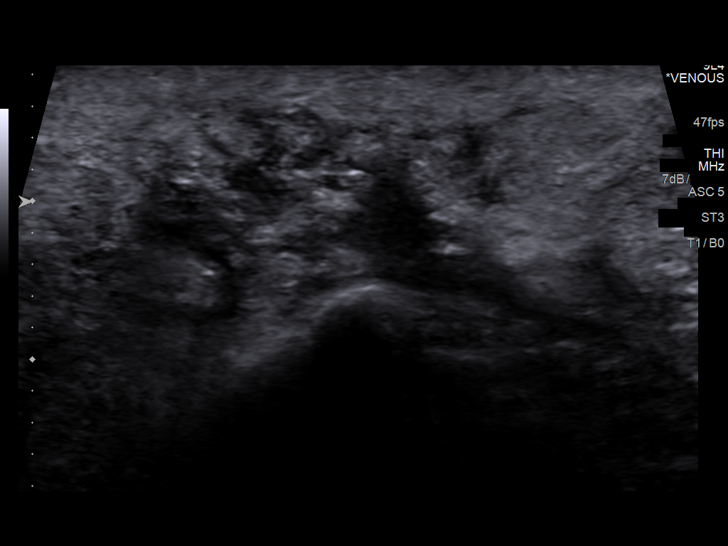
[im 33/40]
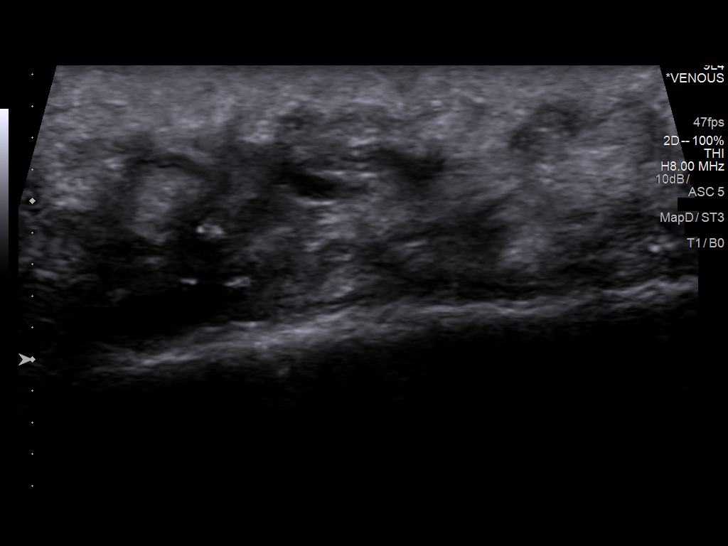
[im 36/40]
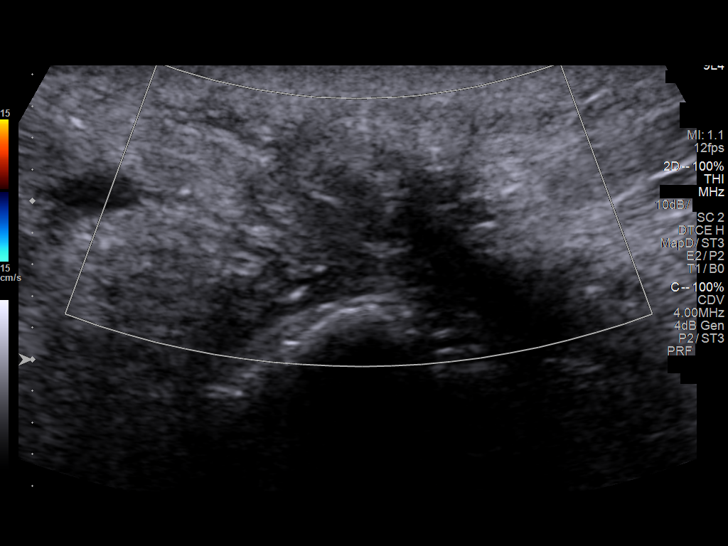
[im 40/40]
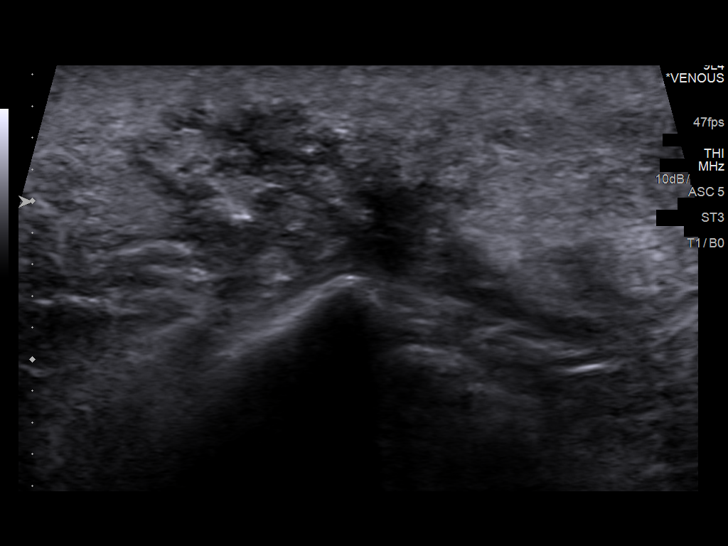

[13 of 24 positions shown; findings below may reference images not displayed]

FINDINGS: Contralateral Common Femoral Vein: Respiratory phasicity is normal
and symmetric with the symptomatic side. No evidence of thrombus.
Normal compressibility.

Common Femoral Vein: No evidence of thrombus. Normal
compressibility, respiratory phasicity and response to augmentation.

Saphenofemoral Junction: No evidence of thrombus. Normal
compressibility and flow on color Doppler imaging.

Profunda Femoral Vein: No evidence of thrombus. Normal
compressibility and flow on color Doppler imaging.

Femoral Vein: No evidence of thrombus. Normal compressibility,
respiratory phasicity and response to augmentation.

Popliteal Vein: No evidence of thrombus. Normal compressibility,
respiratory phasicity and response to augmentation.

Calf Veins: No evidence of thrombus. Normal compressibility and flow
on color Doppler imaging.

Superficial Great Saphenous Vein: No evidence of thrombus. Normal
compressibility and flow on color Doppler imaging.

Venous Reflux:  None.

Other Findings: Generalized soft tissue edema. The area of concern
does not show any specific ultrasound findings. This appears
represent edematous fatty tissue. Nonspecific.
IMPRESSION: No evidence of DVT within the left lower extremity.

Probable area concern in the left low leg looks like edematous fat.
This is nonspecific.

## 2019-06-23 NOTE — Telephone Encounter (Signed)
A message was left,re: follow up appointment. 

## 2019-07-01 ENCOUNTER — Other Ambulatory Visit: Payer: Self-pay | Admitting: Cardiology

## 2019-07-25 ENCOUNTER — Other Ambulatory Visit: Payer: Self-pay | Admitting: Cardiology

## 2019-08-17 ENCOUNTER — Other Ambulatory Visit: Payer: Self-pay | Admitting: Cardiology

## 2019-08-31 ENCOUNTER — Other Ambulatory Visit: Payer: Self-pay | Admitting: Cardiology

## 2019-09-01 NOTE — Telephone Encounter (Signed)
Rosuvastatin refill sent per request

## 2019-09-18 ENCOUNTER — Other Ambulatory Visit: Payer: Self-pay

## 2019-09-18 MED ORDER — POTASSIUM CHLORIDE ER 10 MEQ PO TBCR: 10.0000 meq | EXTENDED_RELEASE_TABLET | Freq: Every day | ORAL | 0 refills | Status: DC

## 2019-10-12 ENCOUNTER — Other Ambulatory Visit: Payer: Self-pay | Admitting: Cardiology

## 2019-10-19 ENCOUNTER — Other Ambulatory Visit: Payer: Self-pay

## 2019-10-19 ENCOUNTER — Encounter: Payer: Self-pay | Admitting: Cardiology

## 2019-10-19 ENCOUNTER — Ambulatory Visit (INDEPENDENT_AMBULATORY_CARE_PROVIDER_SITE_OTHER): Payer: Medicare Other | Admitting: Cardiology

## 2019-10-19 VITALS — BP 134/80 | HR 76 | Ht 64.0 in | Wt 340.2 lb

## 2019-10-19 DIAGNOSIS — I10 Essential (primary) hypertension: Secondary | ICD-10-CM | POA: Diagnosis not present

## 2019-10-19 DIAGNOSIS — I350 Nonrheumatic aortic (valve) stenosis: Secondary | ICD-10-CM

## 2019-10-19 DIAGNOSIS — I6522 Occlusion and stenosis of left carotid artery: Secondary | ICD-10-CM | POA: Diagnosis not present

## 2019-10-19 DIAGNOSIS — I890 Lymphedema, not elsewhere classified: Secondary | ICD-10-CM

## 2019-10-19 DIAGNOSIS — R0602 Shortness of breath: Secondary | ICD-10-CM

## 2019-10-19 DIAGNOSIS — E782 Mixed hyperlipidemia: Secondary | ICD-10-CM

## 2019-10-19 HISTORY — DX: Nonrheumatic aortic (valve) stenosis: I35.0

## 2019-10-19 NOTE — Addendum Note (Signed)
Addended by: Ashok Norris on: 10/19/2019 09:58 AM   Modules accepted: Orders

## 2019-10-19 NOTE — Patient Instructions (Signed)
Medication Instructions:  Your physician recommends that you continue on your current medications as directed. Please refer to the Current Medication list given to you today.  *If you need a refill on your cardiac medications before your next appointment, please call your pharmacy*  Lab Work: None.  If you have labs (blood work) drawn today and your tests are completely normal, you will receive your results only by: . MyChart Message (if you have MyChart) OR . A paper copy in the mail If you have any lab test that is abnormal or we need to change your treatment, we will call you to review the results.  Testing/Procedures: Your physician has requested that you have an echocardiogram. Echocardiography is a painless test that uses sound waves to create images of your heart. It provides your doctor with information about the size and shape of your heart and how well your heart's chambers and valves are working. This procedure takes approximately one hour. There are no restrictions for this procedure.    Follow-Up: At [facility], you and your health needs are our priority.  As part of our continuing mission to provide you with exceptional heart care, we have created designated Provider Care Teams.  These Care Teams include your primary Cardiologist (physician) and Advanced Practice Providers (APPs -  Physician Assistants and Nurse Practitioners) who all work together to provide you with the care you need, when you need it.  Your next appointment:   6 months  The format for your next appointment:   In Person  Provider:   Robert Krasowski, MD  Other Instructions   Echocardiogram An echocardiogram is a procedure that uses painless sound waves (ultrasound) to produce an image of the heart. Images from an echocardiogram can provide important information about:  Signs of coronary artery disease (CAD).  Aneurysm detection. An aneurysm is a weak or damaged part of an artery wall that  bulges out from the normal force of blood pumping through the body.  Heart size and shape. Changes in the size or shape of the heart can be associated with certain conditions, including heart failure, aneurysm, and CAD.  Heart muscle function.  Heart valve function.  Signs of a past heart attack.  Fluid buildup around the heart.  Thickening of the heart muscle.  A tumor or infectious growth around the heart valves. Tell a health care provider about:  Any allergies you have.  All medicines you are taking, including vitamins, herbs, eye drops, creams, and over-the-counter medicines.  Any blood disorders you have.  Any surgeries you have had.  Any medical conditions you have.  Whether you are pregnant or may be pregnant. What are the risks? Generally, this is a safe procedure. However, problems may occur, including:  Allergic reaction to dye (contrast) that may be used during the procedure. What happens before the procedure? No specific preparation is needed. You may eat and drink normally. What happens during the procedure?   An IV tube may be inserted into one of your veins.  You may receive contrast through this tube. A contrast is an injection that improves the quality of the pictures from your heart.  A gel will be applied to your chest.  A wand-like tool (transducer) will be moved over your chest. The gel will help to transmit the sound waves from the transducer.  The sound waves will harmlessly bounce off of your heart to allow the heart images to be captured in real-time motion. The images will be recorded   on a computer. The procedure may vary among health care providers and hospitals. What happens after the procedure?  You may return to your normal, everyday life, including diet, activities, and medicines, unless your health care provider tells you not to do that. Summary  An echocardiogram is a procedure that uses painless sound waves (ultrasound) to produce  an image of the heart.  Images from an echocardiogram can provide important information about the size and shape of your heart, heart muscle function, heart valve function, and fluid buildup around your heart.  You do not need to do anything to prepare before this procedure. You may eat and drink normally.  After the echocardiogram is completed, you may return to your normal, everyday life, unless your health care provider tells you not to do that. This information is not intended to replace advice given to you by your health care provider. Make sure you discuss any questions you have with your health care provider. Document Released: 11/13/2000 Document Revised: 03/09/2019 Document Reviewed: 12/19/2016 Elsevier Patient Education  2020 Elsevier Inc.   

## 2019-10-19 NOTE — Progress Notes (Signed)
Cardiology Office Note:    Date:  10/19/2019   ID:  Bethany Lynch, DOB 1951/04/14, MRN [id number]  PCP:  Hal Morales, NP  Cardiologist:  Gypsy Balsam, MD    Referring MD: Hal Morales, NP   Chief Complaint  Patient presents with  . Follow-up  Doing well  History of Present Illness:    Bethany Lynch is a 68 y.o. female with morbid obesity, aortic stenosis which was mild in May of last year, essential hypertension, noncritical carotid arterial stenosis, dyslipidemia.  She does come to my office today for follow-up overall doing great described however to be short of breath and fatigue.  For a while she did not exercise at all because of COVID-19 restrictions but now she is back to [facility] and she does water aerobics.  She is enjoying it she gained about 15 pounds and of course upset about this and wants to lose some weight.  Described to have fatigue and shortness of breath  No past medical history on file.  Past Surgical History:  Procedure Laterality Date  . GALLBLADDER SURGERY  07/27/1988  . HERNIA REPAIR  04/04/2012  . HERNIA REPAIR  09/09/2015  . HYESTERECTOMY  02/06/1991    Current Medications: Current Meds  Medication Sig  . aspirin 81 MG chewable tablet Chew by mouth daily.  . B Complex-C (B-COMPLEX WITH VITAMIN C) tablet Take 1 tablet by mouth daily.  . cetirizine (ZYRTEC ALLERGY) 10 MG tablet Take 10 mg by mouth daily.  . Cholecalciferol (D3 HIGH POTENCY) 2000 units CAPS Take by mouth daily as needed.  . Cranberry 200 MG CAPS Take 1 capsule by mouth daily as needed.  . dicyclomine (BENTYL) 20 MG tablet Take 1 tablet by mouth 2 (two) times daily.  Marland Kitchen esomeprazole (NEXIUM) 40 MG capsule Take 40 mg by mouth daily.  Marland Kitchen FARXIGA 10 MG TABS tablet Take 10 mg by mouth daily.  . furosemide (LASIX) 40 MG tablet Take 1 tablet (40 mg total) by mouth daily.  . Homeopathic Products (YEAST-GARD ADV HOMEOPATHIC PO) Take by mouth as needed.  . hydrALAZINE (APRESOLINE) 50 MG tablet  Take 1 tablet by mouth 3 (three) times daily.  Marland Kitchen LORazepam (ATIVAN) 0.5 MG tablet TAKE 1 TABLET TWICE A DAY AS NEEDED FOR ANXIETY.  . metFORMIN (GLUCOPHAGE) 1000 MG tablet TAKE 1 TABLET TWICE A DAY FOR DIABETES.  . Misc Natural Products (TART CHERRY ADVANCED PO) Take by mouth daily as needed.  . Multiple Vitamins-Minerals (VISION PLUS PO) Take by mouth daily as needed.  . NON FORMULARY ORGANIC TUMERIC CURCUMIN  WITH GINGER AND BIOPERINE DIETARY SUPPLEMENT TAKE AS NEEDED  . NOVOLOG FLEXPEN 100 UNIT/ML FlexPen Inject 22 Units into the muscle 3 (three) times daily with meals.   . Pediatric Multivitamins-Fl (MULTIVITAMIN DROPS/FLUORIDE PO) Take 1 tablet by mouth daily.  . potassium chloride (KLOR-CON) 10 MEQ tablet TAKE 1 TABLET BY MOUTH EVERY DAY  . rosuvastatin (CRESTOR) 10 MG tablet TAKE 1 TABLET BY MOUTH EVERY DAY  . spironolactone (ALDACTONE) 25 MG tablet Take 25 mg by mouth daily. with food  . telmisartan (MICARDIS) 80 MG tablet Take 1 tablet by mouth daily.  . TRESIBA FLEXTOUCH 200 UNIT/ML SOPN Inject 80 Units into the skin at bedtime.  . verapamil (CALAN-SR) 240 MG CR tablet Take 240 mg by mouth 2 (two) times daily.  . vitamin C (ASCORBIC ACID) 500 MG tablet Take 500 mg by mouth daily.  . vitamin E 400 UNIT capsule Take 400 Units by mouth  daily.     Allergies:   Victoza [liraglutide]   Social History   Socioeconomic History  . Marital status: Married    Spouse name: Not on file  . Number of children: Not on file  . Years of education: Not on file  . Highest education level: Not on file  Occupational History  . Not on file  Social Needs  . Financial resource strain: Not on file  . Food insecurity    Worry: Not on file    Inability: Not on file  . Transportation needs    Medical: Not on file    Non-medical: Not on file  Tobacco Use  . Smoking status: Never Smoker  . Smokeless tobacco: Never Used  Substance and Sexual Activity  . Alcohol use: Not on file  . Drug use: Not  on file  . Sexual activity: Not on file  Lifestyle  . Physical activity    Days per week: Not on file    Minutes per session: Not on file  . Stress: Not on file  Relationships  . Social Herbalist on phone: Not on file    Gets together: Not on file    Attends religious service: Not on file    Active member of club or organization: Not on file    Attends meetings of clubs or organizations: Not on file    Relationship status: Not on file  Other Topics Concern  . Not on file  Social History Narrative  . Not on file     Family History: The patient's family history includes Colon cancer in her mother; Diabetes in her mother; Leukemia in her father; Memory loss in her mother; Stroke in her father. ROS:   Please see the history of present illness.    All 14 point review of systems negative except as described per history of present illness  EKGs/Labs/Other Studies Reviewed:      Recent Labs: No results found for requested labs within last 8760 hours.  Recent Lipid Panel No results found for: CHOL, TRIG, HDL, CHOLHDL, VLDL, LDLCALC, LDLDIRECT  Physical Exam:    VS:  BP 134/80   Pulse 76   Ht 5\' 4"  (1.626 m)   Wt (!) 340 lb 3.2 oz (154.3 kg)   SpO2 99%   BMI 58.40 kg/m     Wt Readings from Last 3 Encounters:  10/19/19 (!) 340 lb 3.2 oz (154.3 kg)  01/02/19 (!) 321 lb 12.8 oz (146 kg)  09/28/18 (!) 321 lb 6.4 oz (145.8 kg)     GEN:  Well nourished, well developed in no acute distress HEENT: Normal NECK: No JVD; No carotid bruits LYMPHATICS: No lymphadenopathy CARDIAC: RRR, no murmurs, no rubs, no gallops RESPIRATORY:  Clear to auscultation without rales, wheezing or rhonchi  ABDOMEN: Soft, non-tender, non-distended MUSCULOSKELETAL:  No edema; No deformity  SKIN: Warm and dry LOWER EXTREMITIES: no swelling NEUROLOGIC:  Alert and oriented x 3 PSYCHIATRIC:  Normal affect   ASSESSMENT:    1. Stenosis of left carotid artery   2. Nonrheumatic aortic valve  stenosis   3. Essential hypertension   4. Shortness of breath   5. Lymphedema of both lower extremities   6. Mixed hyperlipidemia    PLAN:    In order of problems listed above:  1. Cardiac arterial stenosis only up to 39%.  Last test done showed no critical stenosis.  We will continue monitoring. 2. We will schedule her to have carotid ultrasounds.  Obviously risk factors modifications. 3. Aortic valve stenosis echocardiogram will be done to reassess the lesion. 4. Shortness of breath again echocardiogram will be done I think this is a multifactorial issue obviously her severe overweight play significant role here. 5. Lymphedema lower extremities.  She gets massages about once a month which helps tremendously 6. Dyslipidemia will call for her primary care physician to get fasting lipid profile.   Medication Adjustments/Labs and Tests Ordered: Current medicines are reviewed at length with the patient today.  Concerns regarding medicines are outlined above.  No orders of the defined types were placed in this encounter.  Medication changes: No orders of the defined types were placed in this encounter.   Signed, Georgeanna Leaobert J. , MD, [facility] 10/19/2019 9:50 AM    [facility]

## 2019-11-13 ENCOUNTER — Other Ambulatory Visit: Payer: Self-pay

## 2019-11-13 MED ORDER — POTASSIUM CHLORIDE ER 10 MEQ PO TBCR: 10.0000 meq | EXTENDED_RELEASE_TABLET | Freq: Every day | ORAL | 6 refills | Status: DC

## 2019-12-02 ENCOUNTER — Other Ambulatory Visit: Payer: Self-pay | Admitting: Cardiology

## 2019-12-14 ENCOUNTER — Ambulatory Visit (INDEPENDENT_AMBULATORY_CARE_PROVIDER_SITE_OTHER): Payer: Medicare Other

## 2019-12-14 ENCOUNTER — Other Ambulatory Visit: Payer: Self-pay

## 2019-12-14 DIAGNOSIS — I350 Nonrheumatic aortic (valve) stenosis: Secondary | ICD-10-CM

## 2019-12-14 NOTE — Progress Notes (Signed)
Complete echocardiogram has been performed.  Jimmy Tamario Heal RDCS, RVT 

## 2020-01-26 ENCOUNTER — Ambulatory Visit: Payer: Medicare Other | Attending: Internal Medicine

## 2020-01-26 DIAGNOSIS — Z23 Encounter for immunization: Secondary | ICD-10-CM

## 2020-01-26 NOTE — Progress Notes (Signed)
   Covid-19 Vaccination Clinic  Name:  Cierra Rothgeb    MRN: [id number] DOB: 30-Oct-1951  01/26/2020  Ms. Issa was observed post Covid-19 immunization for 15 minutes without incidence. She was provided with Vaccine Information Sheet and instruction to access the V-Safe system.   Ms. Creamer was instructed to call 911 with any severe reactions post vaccine: Marland Kitchen Difficulty breathing  . Swelling of your face and throat  . A fast heartbeat  . A bad rash all over your body  . Dizziness and weakness    Immunizations Administered    Name Date Dose VIS Date Route   Pfizer COVID-19 Vaccine 01/26/2020 12:01 PM 0.3 mL 11/10/2019 Intramuscular   Manufacturer: [employer], [employer]   Lot: [id number]   NDC: [address]-9

## 2020-02-20 ENCOUNTER — Ambulatory Visit: Payer: Medicare Other | Attending: Internal Medicine

## 2020-02-20 DIAGNOSIS — Z23 Encounter for immunization: Secondary | ICD-10-CM

## 2020-02-20 NOTE — Progress Notes (Signed)
   Covid-19 Vaccination Clinic  Name:  Bethany Lynch    MRN: [id number] DOB: 1951-06-14  02/20/2020  [name] was observed post Covid-19 immunization for 15 minutes without incident. She was provided with Vaccine Information Sheet and instruction to access the V-Safe system.   [name] was instructed to call 911 with any severe reactions post vaccine: Marland Kitchen Difficulty breathing  . Swelling of face and throat  . A fast heartbeat  . A bad rash all over body  . Dizziness and weakness   Immunizations Administered    Name Date Dose VIS Date Route   Pfizer COVID-19 Vaccine 02/20/2020  2:44 PM 0.3 mL 11/10/2019 Intramuscular   Manufacturer: [employer], [employer]   Lot: [id number]   NDC: [address]-1

## 2020-03-16 ENCOUNTER — Other Ambulatory Visit: Payer: Self-pay | Admitting: Cardiology

## 2020-05-16 ENCOUNTER — Encounter: Payer: Self-pay | Admitting: Cardiology

## 2020-05-16 ENCOUNTER — Other Ambulatory Visit: Payer: Self-pay

## 2020-05-16 ENCOUNTER — Ambulatory Visit (INDEPENDENT_AMBULATORY_CARE_PROVIDER_SITE_OTHER): Payer: Medicare Other | Admitting: Cardiology

## 2020-05-16 VITALS — BP 138/68 | HR 64 | Ht 64.0 in | Wt 335.0 lb

## 2020-05-16 DIAGNOSIS — I890 Lymphedema, not elsewhere classified: Secondary | ICD-10-CM

## 2020-05-16 DIAGNOSIS — I10 Essential (primary) hypertension: Secondary | ICD-10-CM | POA: Diagnosis not present

## 2020-05-16 DIAGNOSIS — E782 Mixed hyperlipidemia: Secondary | ICD-10-CM

## 2020-05-16 DIAGNOSIS — I350 Nonrheumatic aortic (valve) stenosis: Secondary | ICD-10-CM

## 2020-05-16 DIAGNOSIS — R002 Palpitations: Secondary | ICD-10-CM

## 2020-05-16 DIAGNOSIS — R0602 Shortness of breath: Secondary | ICD-10-CM

## 2020-05-16 DIAGNOSIS — I6522 Occlusion and stenosis of left carotid artery: Secondary | ICD-10-CM | POA: Diagnosis not present

## 2020-05-16 NOTE — Progress Notes (Signed)
Cardiology Office Note:    Date:  05/16/2020   ID:  Bethany Lynch, DOB 12-22-1950, MRN [id number]  PCP:  Hal Morales, NP  Cardiologist:  Gypsy Balsam, MD    Referring MD: Hal Morales, NP   Chief Complaint  Patient presents with   Follow-up  And 35  History of Present Illness:    Bethany Lynch is a 69 y.o. female with past medical history significant for moderate aortic stenosis, last assessment in January of this year, carotic arterial disease, morbid obesity, diabetes, dyslipidemia, essential hypertension, lymphedema of lower extremities.  She comes today to my office for follow-up.  Overall she said that she is doing well still complain of having some exertional shortness of breath but basically doing the same.  Denies having any chest pain tightness squeezing pressure burning chest, no dizziness no passing out.  No past medical history on file.  Past Surgical History:  Procedure Laterality Date   GALLBLADDER SURGERY  07/27/1988   HERNIA REPAIR  04/04/2012   HERNIA REPAIR  09/09/2015   HYESTERECTOMY  02/06/1991    Current Medications: Current Meds  Medication Sig   aspirin 81 MG chewable tablet Chew by mouth daily.   cetirizine (ZYRTEC ALLERGY) 10 MG tablet Take 10 mg by mouth daily.   Cholecalciferol (D3 HIGH POTENCY) 2000 units CAPS Take by mouth daily as needed.   Cranberry 200 MG CAPS Take 1 capsule by mouth daily as needed.   dicyclomine (BENTYL) 20 MG tablet Take 1 tablet by mouth 2 (two) times daily.   esomeprazole (NEXIUM) 40 MG capsule Take 40 mg by mouth daily.   FARXIGA 10 MG TABS tablet Take 10 mg by mouth daily.   furosemide (LASIX) 40 MG tablet Take 1 tablet (40 mg total) by mouth daily.   Homeopathic Products (YEAST-GARD ADV HOMEOPATHIC PO) Take by mouth as needed.   hydrALAZINE (APRESOLINE) 50 MG tablet Take 1 tablet by mouth 3 (three) times daily.   LORazepam (ATIVAN) 0.5 MG tablet TAKE 1 TABLET TWICE A DAY AS NEEDED FOR ANXIETY.     metFORMIN (GLUCOPHAGE) 1000 MG tablet Take 1,000 mg by mouth daily.    Misc Natural Products (TART CHERRY ADVANCED PO) Take by mouth daily as needed.   Multiple Vitamins-Minerals (VISION PLUS PO) Take by mouth daily as needed.   NON FORMULARY ORGANIC TUMERIC CURCUMIN  WITH GINGER AND BIOPERINE DIETARY SUPPLEMENT TAKE AS NEEDED   NOVOLOG FLEXPEN 100 UNIT/ML FlexPen Inject 22 Units into the muscle 3 (three) times daily with meals.    Pediatric Multivitamins-Fl (MULTIVITAMIN DROPS/FLUORIDE PO) Take 1 tablet by mouth daily.   potassium chloride (KLOR-CON) 10 MEQ tablet Take 1 tablet (10 mEq total) by mouth daily. PLEASE CALL OFFICE TO SCHEDULE APPOINTMENT FOR FURTHER REFILLS   rosuvastatin (CRESTOR) 10 MG tablet Take 1 tablet (10 mg total) by mouth daily. PLEASE CALL OFFICE TO SCHEDULE AN APPOINTMENT FOR FURTHER REFILLS   spironolactone (ALDACTONE) 25 MG tablet Take 25 mg by mouth daily. with food   telmisartan (MICARDIS) 80 MG tablet Take 1 tablet by mouth daily.   TRESIBA FLEXTOUCH 200 UNIT/ML SOPN Inject 80 Units into the skin at bedtime.   verapamil (CALAN-SR) 240 MG CR tablet Take 240 mg by mouth 2 (two) times daily.   vitamin E 400 UNIT capsule Take 400 Units by mouth daily.   [DISCONTINUED] vitamin C (ASCORBIC ACID) 500 MG tablet Take 500 mg by mouth daily.     Allergies:   Victoza [liraglutide]   Social  History   Socioeconomic History   Marital status: Married    Spouse name: Not on file   Number of children: Not on file   Years of education: Not on file   Highest education level: Not on file  Occupational History   Not on file  Tobacco Use   Smoking status: Never Smoker   Smokeless tobacco: Never Used  Substance and Sexual Activity   Alcohol use: Not on file   Drug use: Not on file   Sexual activity: Not on file  Other Topics Concern   Not on file  Social History Narrative   Not on file   Social Determinants of Health   Financial Resource  Strain:    Difficulty of Paying Living Expenses:   Food Insecurity:    Worried About [place] in the Last Year:    [place] in the Last Year:   Transportation Needs:    Film/video editor (Medical):    Lack of Transportation (Non-Medical):   Physical Activity:    Days of Exercise per Week:    Minutes of Exercise per Session:   Stress:    Feeling of Stress :   Social Connections:    Frequency of Communication with Friends and Family:    Frequency of Social Gatherings with Friends and Family:    Attends Religious Services:    Active Member of Clubs or Organizations:    Attends Music therapist:    Marital Status:      Family History: The patient's family history includes Colon cancer in her mother; Diabetes in her mother; Leukemia in her father; Memory loss in her mother; Stroke in her father. ROS:   Please see the history of present illness.    All 14 point review of systems negative except as described per history of present illness  EKGs/Labs/Other Studies Reviewed:      Recent Labs: No results found for requested labs within last 8760 hours.  Recent Lipid Panel No results found for: CHOL, TRIG, HDL, CHOLHDL, VLDL, LDLCALC, LDLDIRECT  Physical Exam:    VS:  BP 138/68 (BP Location: Left Arm, Patient Position: Sitting, Cuff Size: Normal) Comment (BP Location): Wrist   Pulse 64    Ht 5\' 4"  (1.626 m)    Wt (!) 335 lb (152 kg)    SpO2 93%    BMI 57.50 kg/m     Wt Readings from Last 3 Encounters:  05/16/20 (!) 335 lb (152 kg)  10/19/19 (!) 340 lb 3.2 oz (154.3 kg)  01/02/19 (!) 321 lb 12.8 oz (146 kg)     GEN:  Well nourished, well developed in no acute distress HEENT: Normal NECK: No JVD; No carotid bruits LYMPHATICS: No lymphadenopathy CARDIAC: RRR, systolic ejection murmur grade 2/6 to 3/6 best heard at right upper portion of the sternum, S2 is still present, no rubs, no gallops RESPIRATORY:  Clear to auscultation  without rales, wheezing or rhonchi  ABDOMEN: Soft, non-tender, non-distended MUSCULOSKELETAL:  No edema; No deformity  SKIN: Warm and dry LOWER EXTREMITIES: no swelling NEUROLOGIC:  Alert and oriented x 3 PSYCHIATRIC:  Normal affect   ASSESSMENT:    1. Stenosis of left carotid artery   2. Nonrheumatic aortic valve stenosis   3. Essential hypertension   4. Shortness of breath   5. Lymphedema of both lower extremities   6. Mixed hyperlipidemia    PLAN:    In order of problems listed above:  1. Cortical field  stenosis: Stamps repeat her cardiac ultrasounds which I will do.  In the meantime we will continue with aspirin and statin. 2. Nonrheumatic aortic valve stenosis last assessment in January which was moderate.  I on the physical exam I still can hear S2.  We will continue present management. 3. When I see her we will do echocardiogram. 4. Essential hypertension blood pressure well controlled continue present management. 5. Diabetes: Last hemoglobin A1c was below 7.  I congratulated her for it and asked her to continue doing what she does. 6. Palpitations this is a new problem that she address today.  She said that sometimes she feels her heart speeding up it usually last for about a minute or so.  It happened every 2 weeks or so.  We will put Zio patch for 14 days try to see if he can catch that arrhythmia.  Aborted obviously about potentially having atrial fibrillation.   Medication Adjustments/Labs and Tests Ordered: Current medicines are reviewed at length with the patient today.  Concerns regarding medicines are outlined above.  No orders of the defined types were placed in this encounter.  Medication changes: No orders of the defined types were placed in this encounter.   Signed, Georgeanna Lea, MD, [facility] 05/16/2020 8:50 AM     [facility]

## 2020-05-16 NOTE — Addendum Note (Signed)
Addended by: Lita Mains on: 05/16/2020 09:06 AM   Modules accepted: Orders

## 2020-05-16 NOTE — Patient Instructions (Signed)
Medication Instructions:  Your physician recommends that you continue on your current medications as directed. Please refer to the Current Medication list given to you today.  *If you need a refill on your cardiac medications before your next appointment, please call your pharmacy*   Lab Work: None.  If you have labs (blood work) drawn today and your tests are completely normal, you will receive your results only by: Marland Kitchen MyChart Message (if you have MyChart) OR . A paper copy in the mail If you have any lab test that is abnormal or we need to change your treatment, we will call you to review the results.   Testing/Procedures: Your physician has requested that you have a carotid duplex. This test is an ultrasound of the carotid arteries in your neck. It looks at blood flow through these arteries that supply the brain with blood. Allow one hour for this exam. There are no restrictions or special instructions.  A zio monitor was ordered today. It will remain on for 14  days. You will then return monitor and event diary in provided box. It takes 1-2 weeks for report to be downloaded and returned to [place]. We will call you with the results. If monitor falls off or has orange flashing light, please call Zio for further instructions.      Follow-Up: At [facility], you and your health needs are our priority.  As part of our continuing mission to provide you with exceptional heart care, we have created designated Provider Care Teams.  These Care Teams include your primary Cardiologist (physician) and Advanced Practice Providers (APPs -  Physician Assistants and Nurse Practitioners) who all work together to provide you with the care you need, when you need it.  We recommend signing up for the patient portal called "MyChart".  Sign up information is provided on this After Visit Summary.  MyChart is used to connect with patients for Virtual Visits (Telemedicine).  Patients are able to view lab/test  results, encounter notes, upcoming appointments, etc.  Non-urgent messages can be sent to your provider as well.   To learn more about what you can do with MyChart, go to [email or web address].    Your next appointment:   6 month(s)  The format for your next appointment:   In Person  Provider:   Gypsy Balsam, MD   Other Instructions

## 2020-05-21 ENCOUNTER — Ambulatory Visit (INDEPENDENT_AMBULATORY_CARE_PROVIDER_SITE_OTHER): Payer: Medicare Other

## 2020-05-21 DIAGNOSIS — R002 Palpitations: Secondary | ICD-10-CM | POA: Diagnosis not present

## 2020-05-29 ENCOUNTER — Ambulatory Visit (INDEPENDENT_AMBULATORY_CARE_PROVIDER_SITE_OTHER): Payer: Medicare Other

## 2020-05-29 ENCOUNTER — Other Ambulatory Visit: Payer: Self-pay

## 2020-05-29 DIAGNOSIS — I6522 Occlusion and stenosis of left carotid artery: Secondary | ICD-10-CM | POA: Diagnosis not present

## 2020-05-29 NOTE — Progress Notes (Addendum)
Carotid artery duplex exam performed.  Jimmy Mousa Prout RDCS, RVT 

## 2020-06-20 ENCOUNTER — Other Ambulatory Visit: Payer: Self-pay | Admitting: Cardiology

## 2020-06-20 DIAGNOSIS — R002 Palpitations: Secondary | ICD-10-CM

## 2020-07-22 ENCOUNTER — Other Ambulatory Visit: Payer: Self-pay | Admitting: Cardiology

## 2020-08-03 ENCOUNTER — Other Ambulatory Visit: Payer: Self-pay | Admitting: Cardiology

## 2020-11-13 ENCOUNTER — Other Ambulatory Visit: Payer: Self-pay

## 2020-11-15 ENCOUNTER — Other Ambulatory Visit: Payer: Self-pay

## 2020-11-15 ENCOUNTER — Encounter: Payer: Self-pay | Admitting: Cardiology

## 2020-11-15 ENCOUNTER — Ambulatory Visit (INDEPENDENT_AMBULATORY_CARE_PROVIDER_SITE_OTHER): Payer: Medicare Other | Admitting: Cardiology

## 2020-11-15 VITALS — BP 160/72 | HR 76 | Ht 64.0 in | Wt 343.0 lb

## 2020-11-15 DIAGNOSIS — I350 Nonrheumatic aortic (valve) stenosis: Secondary | ICD-10-CM

## 2020-11-15 DIAGNOSIS — E782 Mixed hyperlipidemia: Secondary | ICD-10-CM

## 2020-11-15 DIAGNOSIS — R0602 Shortness of breath: Secondary | ICD-10-CM | POA: Diagnosis not present

## 2020-11-15 DIAGNOSIS — I6522 Occlusion and stenosis of left carotid artery: Secondary | ICD-10-CM | POA: Diagnosis not present

## 2020-11-15 DIAGNOSIS — I10 Essential (primary) hypertension: Secondary | ICD-10-CM

## 2020-11-15 DIAGNOSIS — Z6841 Body Mass Index (BMI) 40.0 and over, adult: Secondary | ICD-10-CM | POA: Insufficient documentation

## 2020-11-15 NOTE — Patient Instructions (Signed)
Medication Instructions:  Your physician has recommended you make the following change in your medication:  STOP: Potassium   *If you need a refill on your cardiac medications before your next appointment, please call your pharmacy*   Lab Work: Your physician recommends that you return for lab work in 1 week : bmp   If you have labs (blood work) drawn today and your tests are completely normal, you will receive your results only by: Marland Kitchen MyChart Message (if you have MyChart) OR . A paper copy in the mail If you have any lab test that is abnormal or we need to change your treatment, we will call you to review the results.   Testing/Procedures: Your physician has requested that you have an echocardiogram. Echocardiography is a painless test that uses sound waves to create images of your heart. It provides your doctor with information about the size and shape of your heart and how well your heart's chambers and valves are working. This procedure takes approximately one hour. There are no restrictions for this procedure.     Follow-Up: At [facility], you and your health needs are our priority.  As part of our continuing mission to provide you with exceptional heart care, we have created designated Provider Care Teams.  These Care Teams include your primary Cardiologist (physician) and Advanced Practice Providers (APPs -  Physician Assistants and Nurse Practitioners) who all work together to provide you with the care you need, when you need it.  We recommend signing up for the patient portal called "MyChart".  Sign up information is provided on this After Visit Summary.  MyChart is used to connect with patients for Virtual Visits (Telemedicine).  Patients are able to view lab/test results, encounter notes, upcoming appointments, etc.  Non-urgent messages can be sent to your provider as well.   To learn more about what you can do with MyChart, go to [email or web address].    Your next  appointment:   5 month(s)  The format for your next appointment:   In Person  Provider:   Gypsy Balsam, MD   Other Instructions   Echocardiogram An echocardiogram is a procedure that uses painless sound waves (ultrasound) to produce an image of the heart. Images from an echocardiogram can provide important information about:  Signs of coronary artery disease (CAD).  Aneurysm detection. An aneurysm is a weak or damaged part of an artery wall that bulges out from the normal force of blood pumping through the body.  Heart size and shape. Changes in the size or shape of the heart can be associated with certain conditions, including heart failure, aneurysm, and CAD.  Heart muscle function.  Heart valve function.  Signs of a past heart attack.  Fluid buildup around the heart.  Thickening of the heart muscle.  A tumor or infectious growth around the heart valves. Tell a health care provider about:  Any allergies you have.  All medicines you are taking, including vitamins, herbs, eye drops, creams, and over-the-counter medicines.  Any blood disorders you have.  Any surgeries you have had.  Any medical conditions you have.  Whether you are pregnant or may be pregnant. What are the risks? Generally, this is a safe procedure. However, problems may occur, including:  Allergic reaction to dye (contrast) that may be used during the procedure. What happens before the procedure? No specific preparation is needed. You may eat and drink normally. What happens during the procedure?   An IV tube may be inserted into  one of your veins.  You may receive contrast through this tube. A contrast is an injection that improves the quality of the pictures from your heart.  A gel will be applied to your chest.  A wand-like tool (transducer) will be moved over your chest. The gel will help to transmit the sound waves from the transducer.  The sound waves will harmlessly bounce off  of your heart to allow the heart images to be captured in real-time motion. The images will be recorded on a computer. The procedure may vary among health care providers and hospitals. What happens after the procedure?  You may return to your normal, everyday life, including diet, activities, and medicines, unless your health care provider tells you not to do that. Summary  An echocardiogram is a procedure that uses painless sound waves (ultrasound) to produce an image of the heart.  Images from an echocardiogram can provide important information about the size and shape of your heart, heart muscle function, heart valve function, and fluid buildup around your heart.  You do not need to do anything to prepare before this procedure. You may eat and drink normally.  After the echocardiogram is completed, you may return to your normal, everyday life, unless your health care provider tells you not to do that. This information is not intended to replace advice given to you by your health care provider. Make sure you discuss any questions you have with your health care provider. Document Revised: 03/09/2019 Document Reviewed: 12/19/2016 Elsevier Patient Education  2020 [employer].

## 2020-11-15 NOTE — Progress Notes (Signed)
Cardiology Office Note:    Date:  11/15/2020   ID:  Bethany Lynch, DOB [birth year], MRN [id number]  PCP:  Hal Morales, NP  Cardiologist:  Gypsy Balsam, MD    Referring MD: Hal Morales, NP   Chief Complaint  Patient presents with  . Shortness of Breath    History of Present Illness:    Bethany Lynch is a 69 y.o. female with complex past medical history that include diabetes, essential hypertension, morbid obesity, carotic artery stenosis, aortic stenosis.  She comes today 2 months of follow-up.  Complain of having shortness of breath as usual complaints not worse.  Of course the significant problem is her weight in the medevac her husband recognized the problem and he asked her to start exercising to lose some weight.  And she is actually asking me about that issues.  Denies have any chest pain tightness squeezing pressure burning chest.  On recent lab work test she was noted to have hyperkalemia.  Past Medical History:  Diagnosis Date  . Aortic stenosis 10/19/2019  . Essential hypertension 09/28/2018  . Hyperlipidemia 09/28/2018  . Lymphedema of lower extremity 01/02/2019  . Shortness of breath 09/28/2018  . Stenosis of left carotid artery 09/28/2018    Past Surgical History:  Procedure Laterality Date  . GALLBLADDER SURGERY  07/27/1988  . HERNIA REPAIR  04/04/2012  . HERNIA REPAIR  09/09/2015  . HYESTERECTOMY  02/06/1991    Current Medications: Current Meds  Medication Sig  . aspirin 81 MG chewable tablet Chew by mouth daily.  . calcium-vitamin D (OSCAL WITH D) 500-200 MG-UNIT tablet Take 2 tablets by mouth.  . cetirizine (ZYRTEC) 10 MG tablet Take 10 mg by mouth daily.  . Cranberry 200 MG CAPS Take 1 capsule by mouth daily as needed.  . dicyclomine (BENTYL) 20 MG tablet Take 1 tablet by mouth 2 (two) times daily.  Marland Kitchen esomeprazole (NEXIUM) 40 MG capsule Take 40 mg by mouth daily.  Marland Kitchen FARXIGA 10 MG TABS tablet Take 10 mg by mouth daily.  . furosemide (LASIX) 40 MG  tablet Take 1 tablet (40 mg total) by mouth daily.  . hydrALAZINE (APRESOLINE) 50 MG tablet Take 1 tablet by mouth 3 (three) times daily.  Marland Kitchen LORazepam (ATIVAN) 0.5 MG tablet TAKE 1 TABLET TWICE A DAY AS NEEDED FOR ANXIETY.  . metFORMIN (GLUCOPHAGE) 500 MG tablet Take 500 mg by mouth 2 (two) times daily.  . Misc Natural Products (TART CHERRY ADVANCED PO) Take by mouth daily as needed.  . Multiple Vitamins-Minerals (VISION PLUS PO) Take by mouth daily as needed.  . NON FORMULARY ORGANIC TUMERIC CURCUMIN  WITH GINGER AND BIOPERINE DIETARY SUPPLEMENT TAKE AS NEEDED  . NOVOLOG FLEXPEN 100 UNIT/ML FlexPen Inject 22 Units into the muscle 3 (three) times daily with meals.   . Pediatric Multivitamins-Fl (MULTIVITAMIN DROPS/FLUORIDE PO) Take 1 tablet by mouth daily.  . potassium chloride (KLOR-CON) 10 MEQ tablet TAKE 1 TABLET BY MOUTH EVERY DAY  . rosuvastatin (CRESTOR) 10 MG tablet TAKE 1 TABLET BY MOUTH EVERY DAY  . spironolactone (ALDACTONE) 25 MG tablet Take 25 mg by mouth daily. with food  . telmisartan (MICARDIS) 80 MG tablet Take 1 tablet by mouth daily.  . TRESIBA FLEXTOUCH 200 UNIT/ML SOPN Inject 80 Units into the skin at bedtime.  . verapamil (CALAN-SR) 240 MG CR tablet Take 240 mg by mouth 2 (two) times daily.  . vitamin E 400 UNIT capsule Take 400 Units by mouth daily.  Marland Kitchen zinc gluconate 50  MG tablet Take 50 mg by mouth daily.     Allergies:   Liraglutide   Social History   Socioeconomic History  . Marital status: Married    Spouse name: Not on file  . Number of children: Not on file  . Years of education: Not on file  . Highest education level: Not on file  Occupational History  . Not on file  Tobacco Use  . Smoking status: Never Smoker  . Smokeless tobacco: Never Used  Substance and Sexual Activity  . Alcohol use: Not on file  . Drug use: Not on file  . Sexual activity: Not on file  Other Topics Concern  . Not on file  Social History Narrative  . Not on file   Social  Determinants of Health   Financial Resource Strain: Not on file  Food Insecurity: Not on file  Transportation Needs: Not on file  Physical Activity: Not on file  Stress: Not on file  Social Connections: Not on file     Family History: The patient's family history includes Colon cancer in her mother; Diabetes in her mother; Leukemia in her father; Memory loss in her mother; Stroke in her father. ROS:   Please see the history of present illness.    All 14 point review of systems negative except as described per history of present illness  EKGs/Labs/Other Studies Reviewed:      Recent Labs: No results found for requested labs within last 8760 hours.  Recent Lipid Panel No results found for: CHOL, TRIG, HDL, CHOLHDL, VLDL, LDLCALC, LDLDIRECT  Physical Exam:    VS:  BP (!) 160/72 (BP Location: Left Arm, Patient Position: Sitting)   Pulse 76   Ht 5\' 4"  (1.626 m)   Wt (!) 343 lb (155.6 kg)   SpO2 96%   BMI 58.88 kg/m     Wt Readings from Last 3 Encounters:  11/15/20 (!) 343 lb (155.6 kg)  05/16/20 (!) 335 lb (152 kg)  10/19/19 (!) 340 lb 3.2 oz (154.3 kg)     GEN:  Well nourished, well developed in no acute distress HEENT: Normal NECK: No JVD; No carotid bruits LYMPHATICS: No lymphadenopathy CARDIAC: RRR, no murmurs, no rubs, no gallops RESPIRATORY:  Clear to auscultation without rales, wheezing or rhonchi  ABDOMEN: Soft, non-tender, non-distended MUSCULOSKELETAL:  No edema; No deformity  SKIN: Warm and dry LOWER EXTREMITIES: no swelling NEUROLOGIC:  Alert and oriented x 3 PSYCHIATRIC:  Normal affect   ASSESSMENT:    1. Nonrheumatic aortic valve stenosis   2. Stenosis of left carotid artery   3. Essential hypertension   4. Shortness of breath   5. Mixed hyperlipidemia   6. Morbid obesity (HCC)    PLAN:    In order of problems listed above:  1. Nonrheumatic arctic valve stenosis.  Echocardiogram will be repeated to recheck on significance of the lesion on  the physical exam I do not hear any worrisome sounds. 2. Carotic arterial stenosis.  Last carotid ultrasound did not show any critical lesions. 3. Essential hypertension still a problem however check her blood pressure at home is always quite good.  I will continue present management. 4. Shortness of breath multifactorial, obviously her obesity play significant role here. 5. Mixed dyslipidemia: I did review her fasting lipid from primary care physician which is acceptable.  She is on Crestor 10 which I will continue. 6. Morbid obesity obviously significant problem.  She will be referred to her obesity clinic within [facility] to  see if we can help her with that.   Medication Adjustments/Labs and Tests Ordered: Current medicines are reviewed at length with the patient today.  Concerns regarding medicines are outlined above.  No orders of the defined types were placed in this encounter.  Medication changes: No orders of the defined types were placed in this encounter.   Signed, Georgeanna Lea, MD, [facility] 11/15/2020 9:46 AM    [facility]

## 2020-11-18 ENCOUNTER — Ambulatory Visit (INDEPENDENT_AMBULATORY_CARE_PROVIDER_SITE_OTHER): Payer: Medicare Other

## 2020-11-18 ENCOUNTER — Other Ambulatory Visit: Payer: Self-pay

## 2020-11-18 DIAGNOSIS — I6522 Occlusion and stenosis of left carotid artery: Secondary | ICD-10-CM | POA: Diagnosis not present

## 2020-11-18 DIAGNOSIS — I350 Nonrheumatic aortic (valve) stenosis: Secondary | ICD-10-CM | POA: Diagnosis not present

## 2020-11-18 DIAGNOSIS — I10 Essential (primary) hypertension: Secondary | ICD-10-CM

## 2020-11-18 DIAGNOSIS — R0602 Shortness of breath: Secondary | ICD-10-CM | POA: Diagnosis not present

## 2020-11-18 DIAGNOSIS — E782 Mixed hyperlipidemia: Secondary | ICD-10-CM

## 2020-11-18 LAB — ECHOCARDIOGRAM COMPLETE
AR max vel: 1.33 cm2
AV Area VTI: 1.33 cm2
AV Area mean vel: 1.43 cm2
AV Mean grad: 15 mmHg
AV Peak grad: 32 mmHg
Ao pk vel: 2.83 m/s
Area-P 1/2: 3.42 cm2
S' Lateral: 3.15 cm

## 2020-11-28 LAB — BASIC METABOLIC PANEL
BUN/Creatinine Ratio: 32 — ABNORMAL HIGH (ref 12–28)
BUN: 36 mg/dL — ABNORMAL HIGH (ref 8–27)
CO2: 24 mmol/L (ref 20–29)
Calcium: 9.9 mg/dL (ref 8.7–10.3)
Chloride: 100 mmol/L (ref 96–106)
Creatinine, Ser: 1.14 mg/dL — ABNORMAL HIGH (ref 0.57–1.00)
GFR calc Af Amer: 57 mL/min/{1.73_m2} — ABNORMAL LOW (ref >59)
GFR calc non Af Amer: 50 mL/min/{1.73_m2} — ABNORMAL LOW (ref >59)
Glucose: 130 mg/dL — ABNORMAL HIGH (ref 65–99)
Potassium: 5.1 mmol/L (ref 3.5–5.2)
Sodium: 140 mmol/L (ref 134–144)

## 2020-12-03 ENCOUNTER — Telehealth: Payer: Self-pay | Admitting: Cardiology

## 2020-12-03 NOTE — Telephone Encounter (Signed)
Patient returning call for lab results. 

## 2020-12-04 NOTE — Telephone Encounter (Signed)
Left message for patient to return call.

## 2020-12-04 NOTE — Telephone Encounter (Signed)
Left message for patient to return call. Every time I call it goes to voicemail.

## 2020-12-04 NOTE — Telephone Encounter (Signed)
Pt returning call

## 2020-12-04 NOTE — Telephone Encounter (Signed)
Patient is returning call.  °

## 2020-12-05 ENCOUNTER — Telehealth: Payer: Self-pay | Admitting: Emergency Medicine

## 2020-12-05 DIAGNOSIS — R7989 Other specified abnormal findings of blood chemistry: Secondary | ICD-10-CM

## 2020-12-05 NOTE — Telephone Encounter (Signed)
Patient returning Bethany Lynch call.  

## 2020-12-05 NOTE — Telephone Encounter (Signed)
Patient informed of results she will have labs drawn in 2 weeks.

## 2020-12-05 NOTE — Telephone Encounter (Signed)
-----   Message from Georgeanna Lea, MD sent at 11/28/2020  8:27 AM EST ----- Chem-7 showed minimal elevation of creatinine which indicates mild kidney dysfunction.  Will recheck Chem-7 within next 2 weeks

## 2020-12-05 NOTE — Telephone Encounter (Signed)
Left message for patient to return call.

## 2020-12-05 NOTE — Telephone Encounter (Signed)
Patient has been informed.

## 2020-12-25 LAB — BASIC METABOLIC PANEL
BUN/Creatinine Ratio: 32 — ABNORMAL HIGH (ref 12–28)
BUN: 39 mg/dL — ABNORMAL HIGH (ref 8–27)
CO2: 25 mmol/L (ref 20–29)
Calcium: 9.6 mg/dL (ref 8.7–10.3)
Chloride: 101 mmol/L (ref 96–106)
Creatinine, Ser: 1.21 mg/dL — ABNORMAL HIGH (ref 0.57–1.00)
GFR calc Af Amer: 53 mL/min/{1.73_m2} — ABNORMAL LOW (ref >59)
GFR calc non Af Amer: 46 mL/min/{1.73_m2} — ABNORMAL LOW (ref >59)
Glucose: 107 mg/dL — ABNORMAL HIGH (ref 65–99)
Potassium: 4.9 mmol/L (ref 3.5–5.2)
Sodium: 142 mmol/L (ref 134–144)

## 2020-12-27 ENCOUNTER — Telehealth: Payer: Self-pay | Admitting: Cardiology

## 2020-12-27 NOTE — Telephone Encounter (Signed)
Patient is returning call to discuss lab results. 

## 2020-12-30 ENCOUNTER — Telehealth: Payer: Self-pay | Admitting: Emergency Medicine

## 2020-12-30 DIAGNOSIS — R7989 Other specified abnormal findings of blood chemistry: Secondary | ICD-10-CM

## 2020-12-30 NOTE — Telephone Encounter (Signed)
Called patient. Informed her of results. Patient will have labs rechecked in 3 weeks.

## 2020-12-30 NOTE — Telephone Encounter (Signed)
-----   Message from Bethany Lea, MD sent at 12/26/2020  9:16 AM EST ----- Kidney function seems to be stable may be a little bit worse, please repeat Chem-7 within next 3 weeks

## 2021-01-22 LAB — BASIC METABOLIC PANEL
BUN/Creatinine Ratio: 25 (ref 12–28)
BUN: 31 mg/dL — ABNORMAL HIGH (ref 8–27)
CO2: 20 mmol/L (ref 20–29)
Calcium: 9.9 mg/dL (ref 8.7–10.3)
Chloride: 101 mmol/L (ref 96–106)
Creatinine, Ser: 1.26 mg/dL — ABNORMAL HIGH (ref 0.57–1.00)
GFR calc Af Amer: 51 mL/min/{1.73_m2} — ABNORMAL LOW (ref >59)
GFR calc non Af Amer: 44 mL/min/{1.73_m2} — ABNORMAL LOW (ref >59)
Glucose: 196 mg/dL — ABNORMAL HIGH (ref 65–99)
Potassium: 5.2 mmol/L (ref 3.5–5.2)
Sodium: 141 mmol/L (ref 134–144)

## 2021-01-28 ENCOUNTER — Encounter (INDEPENDENT_AMBULATORY_CARE_PROVIDER_SITE_OTHER): Payer: Self-pay | Admitting: Bariatrics

## 2021-01-28 ENCOUNTER — Ambulatory Visit (INDEPENDENT_AMBULATORY_CARE_PROVIDER_SITE_OTHER): Payer: Medicare Other | Admitting: Bariatrics

## 2021-01-28 ENCOUNTER — Other Ambulatory Visit: Payer: Self-pay

## 2021-01-28 VITALS — BP 146/66 | HR 64 | Temp 97.4°F | Ht 64.0 in | Wt 339.0 lb

## 2021-01-28 DIAGNOSIS — E1169 Type 2 diabetes mellitus with other specified complication: Secondary | ICD-10-CM

## 2021-01-28 DIAGNOSIS — R5383 Other fatigue: Secondary | ICD-10-CM | POA: Diagnosis not present

## 2021-01-28 DIAGNOSIS — E559 Vitamin D deficiency, unspecified: Secondary | ICD-10-CM

## 2021-01-28 DIAGNOSIS — Z6841 Body Mass Index (BMI) 40.0 and over, adult: Secondary | ICD-10-CM | POA: Diagnosis not present

## 2021-01-28 DIAGNOSIS — I6522 Occlusion and stenosis of left carotid artery: Secondary | ICD-10-CM

## 2021-01-28 DIAGNOSIS — R0602 Shortness of breath: Secondary | ICD-10-CM | POA: Diagnosis not present

## 2021-01-28 DIAGNOSIS — E1159 Type 2 diabetes mellitus with other circulatory complications: Secondary | ICD-10-CM | POA: Diagnosis not present

## 2021-01-28 DIAGNOSIS — Z0289 Encounter for other administrative examinations: Secondary | ICD-10-CM

## 2021-01-28 DIAGNOSIS — E785 Hyperlipidemia, unspecified: Secondary | ICD-10-CM

## 2021-01-28 DIAGNOSIS — Z1331 Encounter for screening for depression: Secondary | ICD-10-CM

## 2021-01-28 DIAGNOSIS — E669 Obesity, unspecified: Secondary | ICD-10-CM

## 2021-01-28 DIAGNOSIS — E538 Deficiency of other specified B group vitamins: Secondary | ICD-10-CM

## 2021-01-28 DIAGNOSIS — I152 Hypertension secondary to endocrine disorders: Secondary | ICD-10-CM

## 2021-01-29 ENCOUNTER — Encounter (INDEPENDENT_AMBULATORY_CARE_PROVIDER_SITE_OTHER): Payer: Self-pay | Admitting: Bariatrics

## 2021-01-29 LAB — HEMOGLOBIN A1C
Est. average glucose Bld gHb Est-mCnc: 134 mg/dL
Hgb A1c MFr Bld: 6.3 % — ABNORMAL HIGH (ref 4.8–5.6)

## 2021-01-29 LAB — VITAMIN B12: Vitamin B-12: 927 pg/mL (ref 232–1245)

## 2021-01-29 LAB — INSULIN, RANDOM: INSULIN: 3.7 u[IU]/mL (ref 2.6–24.9)

## 2021-01-29 LAB — VITAMIN D 25 HYDROXY (VIT D DEFICIENCY, FRACTURES): Vit D, 25-Hydroxy: 54.8 ng/mL (ref 30.0–100.0)

## 2021-01-29 NOTE — Progress Notes (Signed)
Dear Dr. Bing MatterKrasowski,   Thank you for referring Arturo MortonRobbie Feick to our clinic. The following note includes my evaluation and treatment recommendations.  Chief Complaint:   OBESITY Arturo MortonRobbie Frank (MR# [id number]) is a 70 y.o. female who presents for evaluation and treatment of obesity and related comorbidities. Current BMI is Body mass index is 58.19 kg/m. Gaynelle AduRobbie has been struggling with her weight for many years and has been unsuccessful in either losing weight, maintaining weight loss, or reaching her healthy weight goal.  Gaynelle AduRobbie is currently in the action stage of change and ready to dedicate time achieving and maintaining a healthier weight. Gaynelle AduRobbie is interested in becoming our patient and working on intensive lifestyle modifications including (but not limited to) diet and exercise for weight loss.  She does not like to cook.  She craves "fast food".  Mikita's habits were reviewed today and are as follows: Her family eats meals together, she thinks her family will eat healthier with her, her desired weight loss is 140 pounds, she has been heavy most of her life, she started gaining weight after having children, her heaviest weight ever was 340 pounds, she is a picky eater and doesn't like to eat healthier foods, she craves hamburgers, french fries, and eggs, she snacks frequently in the evenings, she frequently eats larger portions than normal and she struggles with emotional eating.  Depression Screen Nilam's Food and Mood (modified PHQ-9) score was 18.  Depression screen PHQ 2/9 01/28/2021  Decreased Interest 3  Down, Depressed, Hopeless 1  PHQ - 2 Score 4  Altered sleeping 3  Tired, decreased energy 3  Change in appetite 1  Feeling bad or failure about yourself  2  Trouble concentrating 2  Moving slowly or fidgety/restless 2  Suicidal thoughts 1  PHQ-9 Score 18  Difficult doing work/chores Very difficult   Subjective:   1. Other fatigue Frannie admits to daytime somnolence and  reports waking up still tired. Patent has a history of symptoms of daytime fatigue, morning fatigue, morning headache and snoring. Gaynelle AduRobbie generally gets 8 hours of sleep per night, and states that she has generally restful sleep. Snoring is present. Apneic episodes are present. Epworth Sleepiness Score is 11.  2. SOB (shortness of breath) on exertion Tawna notes increasing shortness of breath with exercising and seems to be worsening over time with weight gain. She notes getting out of breath sooner with activity than she used to. This has gotten worse recently. Gaynelle AduRobbie denies shortness of breath at rest or orthopnea.  3. Hypertension associated with type 2 diabetes mellitus (HCC) Controlled.  Review: taking medications as instructed, no medication side effects noted, no chest pain on exertion, no dyspnea on exertion, no swelling of ankles.    BP Readings from Last 3 Encounters:  01/28/21 (!) 146/66  11/15/20 (!) 160/72  05/16/20 138/68   4. Hyperlipidemia associated with type 2 diabetes mellitus (HCC) Mylinh has hyperlipidemia and has been trying to improve her cholesterol levels with intensive lifestyle modification including a low saturated fat diet, exercise and weight loss. She denies any chest pain, claudication or myalgias.  She is taking Crestor.  5. Stenosis of left carotid artery Gaynelle AduRobbie is being followed by Cardiology.  6. Diabetes mellitus type 2 in obese [facility](HCC) Gaynelle AduRobbie is taking metformin, Tresiba, [place], and [place].  She states her last A1c was 6.4, glucose 196.  Lab Results  Component Value Date   HGBA1C 6.3 (H) 01/28/2021   Lab Results  Component Value Date  CREATININE 1.26 (H) 01/21/2021   Lab Results  Component Value Date   INSULIN WILL FOLLOW 01/28/2021   7. Vitamin B 12 deficiency She notes fatigue. She is not a vegetarian.  She is not taking a vitamin B12 supplement.  Lab Results  Component Value Date   VITAMINB12 927 01/28/2021   8. Vitamin D  deficiency She is currently taking OTC vitamin D 2,000 IU each day. She denies nausea, vomiting or muscle weakness.  9. Depression screen Mahaley was screened for depression as part of her new patient workup.  PHQ-9 is 18.  Assessment/Plan:   1. Other fatigue Welma does feel that her weight is causing her energy to be lower than it should be. Fatigue may be related to obesity, depression or many other causes. Labs will be ordered, and in the meanwhile, Andra will focus on self care including making healthy food choices, increasing physical activity and focusing on stress reduction.  - EKG 12-Lead  2. SOB (shortness of breath) on exertion Lorijean does feel that she gets out of breath more easily that she used to when she exercises. Jaycelynn's shortness of breath appears to be obesity related and exercise induced. She has agreed to work on weight loss and gradually increase exercise to treat her exercise induced shortness of breath. Will continue to monitor closely.  3. Hypertension associated with type 2 diabetes mellitus (HCC) Myangel is working on healthy weight loss and exercise to improve blood pressure control. We will watch for signs of hypotension as she continues her lifestyle modifications.  Continue medications.  4. Hyperlipidemia associated with type 2 diabetes mellitus (HCC) Cardiovascular risk and specific lipid/LDL goals reviewed.  We discussed several lifestyle modifications today and Toshika will continue to work on diet, exercise and weight loss efforts. Orders and follow up as documented in patient record.  Continue medication.  Counseling Intensive lifestyle modifications are the first line treatment for this issue. . Dietary changes: Increase soluble fiber. Decrease simple carbohydrates. . Exercise changes: Moderate to vigorous-intensity aerobic activity 150 minutes per week if tolerated. . Lipid-lowering medications: see documented in medical record.  5. Stenosis of left  carotid artery Follow-up with Cardiology as needed.  6. Diabetes mellitus type 2 in obese (HCC) Decrease carbohydrates.  Will have glucose tablets on hand (handout given on hypoglycemia).  Decreased insulin from 22 units to 11 units with breakfast, lunch, and dinner.  Check FBS and 2 hour postprandial and at bedtime.  Will check labs today.  - Hemoglobin A1c - Insulin, random  7. Vitamin B 12 deficiency The diagnosis was reviewed with the patient. Counseling provided today, see below. We will continue to monitor. Orders and follow up as documented in patient record.  Will check vitamin B12 level today.  Counseling . The body needs vitamin B12: to make red blood cells; to make DNA; and to help the nerves work properly so they can carry messages from the brain to the body.  . The main causes of vitamin B12 deficiency include dietary deficiency, digestive diseases, pernicious anemia, and having a surgery in which part of the stomach or small intestine is removed.  . Certain medicines can make it harder for the body to absorb vitamin B12. These medicines include: heartburn medications; some antibiotics; some medications used to treat diabetes, gout, and high cholesterol.  . In some cases, there are no symptoms of this condition. If the condition leads to anemia or nerve damage, various symptoms can occur, such as weakness or fatigue, shortness of  breath, and numbness or tingling in your hands and feet.   . Treatment:  o May include taking vitamin B12 supplements.  o Avoid alcohol.  o Eat lots of healthy foods that contain vitamin B12: - Beef, pork, chicken, [place], and organ meats, such as liver.  - Seafood: This includes clams, rainbow trout, salmon, tuna, and haddock. Eggs.  - Cereal and dairy products that are fortified: This means that vitamin B12 has been added to the food.   - Vitamin B12  8. Vitamin D deficiency Low Vitamin D level contributes to fatigue and are associated with  obesity, breast, and colon cancer. She agrees to continue to take OTC vitamin D and will check vitamin D level today.  - VITAMIN D 25 Hydroxy (Vit-D Deficiency, Fractures)  9. Depression screen Esmirna had a positive depression screening. Depression is commonly associated with obesity and often results in emotional eating behaviors. We will monitor this closely and work on CBT to help improve the non-hunger eating patterns. Referral to Psychology may be required if no improvement is seen as she continues in our clinic.  10. Class 3 severe obesity with serious comorbidity and body mass index (BMI) of 50.0 to 59.9 in adult, unspecified obesity type (HCC)  Melodye is currently in the action stage of change and her goal is to continue with weight loss efforts. I recommend Deandrea begin the structured treatment plan as follows:  She has agreed to the Category 2 Plan.  She will work on meal planning, intentional eating, stopping sugary drinks, and increasing her water intake.  Reviewed labs from 01/21/2021, including glucose and CMP.  Exercise goals: No exercise has been prescribed at this time.   Behavioral modification strategies: increasing lean protein intake, decreasing simple carbohydrates, increasing vegetables, increasing water intake, decreasing eating out, no skipping meals, meal planning and cooking strategies, keeping healthy foods in the home and planning for success.  She was informed of the importance of frequent follow-up visits to maximize her success with intensive lifestyle modifications for her multiple health conditions. She was informed we would discuss her lab results at her next visit unless there is a critical issue that needs to be addressed sooner. Nainika agreed to keep her next visit at the agreed upon time to discuss these results.  Objective:   Blood pressure (!) 146/66, pulse 64, temperature (!) 97.4 F (36.3 C), height 5\' 4"  (1.626 m), weight (!) 339 lb (153.8 kg), SpO2  95 %. Body mass index is 58.19 kg/m.  EKG: Normal sinus rhythm, rate 62 bpm.  Indirect Calorimeter completed today shows a VO2 of 250 and a REE of 1737.    General: Cooperative, alert, well developed, in no acute distress. HEENT: Conjunctivae and lids unremarkable. Cardiovascular: Regular rhythm.  Lungs: Normal work of breathing. Neurologic: No focal deficits.   Lab Results  Component Value Date   CREATININE 1.26 (H) 01/21/2021   BUN 31 (H) 01/21/2021   NA 141 01/21/2021   K 5.2 01/21/2021   CL 101 01/21/2021   CO2 20 01/21/2021   Lab Results  Component Value Date   HGBA1C 6.3 (H) 01/28/2021   Lab Results  Component Value Date   INSULIN WILL FOLLOW 01/28/2021   Lab Results  Component Value Date   TSH 2.830 09/28/2018   Obesity Behavioral Intervention:   Approximately 15 minutes were spent on the discussion below.  ASK: We discussed the diagnosis of obesity with 09/30/2018 today and Carletta agreed to give [name] permission to discuss obesity  behavioral modification therapy today.  ASSESS: Mardy has the diagnosis of obesity and her BMI today is 58.19. Jahnessa is in the action stage of change.   ADVISE: Venesha was educated on the multiple health risks of obesity as well as the benefit of weight loss to improve her health. She was advised of the need for long term treatment and the importance of lifestyle modifications to improve her current health and to decrease her risk of future health problems.  AGREE: Multiple dietary modification options and treatment options were discussed and Ghadeer agreed to follow the recommendations documented in the above note.  ARRANGE: Sharyn was educated on the importance of frequent visits to treat obesity as outlined per CMS and USPSTF guidelines and agreed to schedule her next follow up appointment today.  Attestation Statements:   Reviewed by clinician on day of visit: allergies, medications, problem list, medical history, surgical  history, family history, social history, and previous encounter notes.  I, Insurance claims handler, CMA, am acting as Energy manager for [employer], DO  I have reviewed the above documentation for accuracy and completeness, and I agree with the above. [name], DO

## 2021-02-11 ENCOUNTER — Ambulatory Visit (INDEPENDENT_AMBULATORY_CARE_PROVIDER_SITE_OTHER): Payer: Medicare Other | Admitting: Bariatrics

## 2021-02-11 ENCOUNTER — Other Ambulatory Visit: Payer: Self-pay

## 2021-02-11 VITALS — BP 140/82 | HR 57 | Temp 97.5°F | Ht 64.0 in | Wt 327.0 lb

## 2021-02-11 DIAGNOSIS — E1159 Type 2 diabetes mellitus with other circulatory complications: Secondary | ICD-10-CM

## 2021-02-11 DIAGNOSIS — I152 Hypertension secondary to endocrine disorders: Secondary | ICD-10-CM

## 2021-02-11 DIAGNOSIS — E1169 Type 2 diabetes mellitus with other specified complication: Secondary | ICD-10-CM | POA: Diagnosis not present

## 2021-02-11 DIAGNOSIS — E669 Obesity, unspecified: Secondary | ICD-10-CM | POA: Diagnosis not present

## 2021-02-11 DIAGNOSIS — E559 Vitamin D deficiency, unspecified: Secondary | ICD-10-CM

## 2021-02-11 DIAGNOSIS — Z6841 Body Mass Index (BMI) 40.0 and over, adult: Secondary | ICD-10-CM | POA: Diagnosis not present

## 2021-02-12 ENCOUNTER — Encounter (INDEPENDENT_AMBULATORY_CARE_PROVIDER_SITE_OTHER): Payer: Self-pay | Admitting: Bariatrics

## 2021-02-12 NOTE — Progress Notes (Signed)
Chief Complaint:   OBESITY Bethany Lynch is here to discuss her progress with her obesity treatment plan along with follow-up of her obesity related diagnoses. Bethany Lynch is on the Category 2 Plan and states she is following her eating plan approximately 90% of the time. Bethany Lynch states she is doing water aerobics for 45 minutes 2 times per week and doing cardio/strength training for 25 minutes 2 times per week.  Today's visit was #: 2 Starting weight: 339 lbs Starting date: 01/28/2021 Today's weight: 327 lbs Today's date: 02/11/2021 Total lbs lost to date: 12 lbs Total lbs lost since last in-office visit: 12 lbs  Interim History: Bethany Lynch is doing well with her meal plan and stated that her daughter is helping.  Subjective:   1. Diabetes mellitus type 2 in obese (HCC) FBS 120s.  Taking [place].  A1c 6.3, Insulin 3.7.  Lab Results  Component Value Date   HGBA1C 6.3 (H) 01/28/2021   Lab Results  Component Value Date   CREATININE 1.26 (H) 01/21/2021   Lab Results  Component Value Date   INSULIN 3.7 01/28/2021   2. Hypertension associated with type 2 diabetes mellitus (HCC) Reasonably well controlled.  Review: taking medications as instructed, no medication side effects noted, no chest pain on exertion, no dyspnea on exertion, no swelling of ankles.    BP Readings from Last 3 Encounters:  02/11/21 140/82  01/28/21 (!) 146/66  11/15/20 (!) 160/72   3. Vitamin D deficiency Bethany Lynch's Vitamin D level was 54.8 on 01/28/2021. She is currently taking OTC vitamin D 2,000 IU each day. She denies nausea, vomiting or muscle weakness.  Also taking zinc.  Assessment/Plan:   1. Diabetes mellitus type 2 in obese (HCC) Good blood sugar control is important to decrease the likelihood of diabetic complications such as nephropathy, neuropathy, limb loss, blindness, coronary artery disease, and death. Intensive lifestyle modification including diet, exercise and weight loss are the first line of treatment  for diabetes.  Novolog 9 units into the muscle three times daily with meals and continue [place].  She has glucose tablets.  2. Hypertension associated with type 2 diabetes mellitus (HCC) Bethany Lynch is working on healthy weight loss and exercise to improve blood pressure control. We will watch for signs of hypotension as she continues her lifestyle modifications.  Continue medications.  3. Vitamin D deficiency Low Vitamin D level contributes to fatigue and are associated with obesity, breast, and colon cancer. She agrees to continue to take OTC vitamin D 2,000 IU daily and will follow-up for routine testing of Vitamin D, at least 2-3 times per year to avoid over-replacement.  Continue zinc as well.  4. Class 3 severe obesity with serious comorbidity and body mass index (BMI) of 50.0 to 59.9 in adult, unspecified obesity type (HCC)  Bethany Lynch is currently in the action stage of change. As such, her goal is to continue with weight loss efforts. She has agreed to the Category 2 Plan.   She will work on meal planning.  Labs from 01/28/2021, including vitamin D, B12, A1c, and insulin level were reviewed today.  Exercise goals: No exercise has been prescribed at this time.  Behavioral modification strategies: increasing lean protein intake, decreasing simple carbohydrates, increasing vegetables, increasing water intake, decreasing eating out, no skipping meals, meal planning and cooking strategies, keeping healthy foods in the home and planning for success.  Bethany Lynch has agreed to follow-up with our clinic in 2 weeks. She was informed of the importance of frequent follow-up visits  to maximize her success with intensive lifestyle modifications for her multiple health conditions.   Objective:   Blood pressure 140/82, pulse (!) 57, temperature (!) 97.5 F (36.4 C), height 5\' 4"  (1.626 m), weight (!) 327 lb (148.3 kg), SpO2 96 %. Body mass index is 56.13 kg/m.  General: Cooperative, alert, well developed, in  no acute distress. HEENT: Conjunctivae and lids unremarkable. Cardiovascular: Regular rhythm.  Lungs: Normal work of breathing. Neurologic: No focal deficits.   Lab Results  Component Value Date   CREATININE 1.26 (H) 01/21/2021   BUN 31 (H) 01/21/2021   NA 141 01/21/2021   K 5.2 01/21/2021   CL 101 01/21/2021   CO2 20 01/21/2021   Lab Results  Component Value Date   HGBA1C 6.3 (H) 01/28/2021   Lab Results  Component Value Date   INSULIN 3.7 01/28/2021   Lab Results  Component Value Date   TSH 2.830 09/28/2018   Obesity Behavioral Intervention:   Approximately 15 minutes were spent on the discussion below.  ASK: We discussed the diagnosis of obesity with Bethany Lynch today and Bethany Lynch agreed to give Bethany Lynch permission to discuss obesity behavioral modification therapy today.  ASSESS: Bethany Lynch has the diagnosis of obesity and her BMI today is 56.13. Bethany Lynch is in the action stage of change.   ADVISE: Bethany Lynch was educated on the multiple health risks of obesity as well as the benefit of weight loss to improve her health. She was advised of the need for long term treatment and the importance of lifestyle modifications to improve her current health and to decrease her risk of future health problems.  AGREE: Multiple dietary modification options and treatment options were discussed and Bethany Lynch agreed to follow the recommendations documented in the above note.  ARRANGE: Bethany Lynch was educated on the importance of frequent visits to treat obesity as outlined per CMS and USPSTF guidelines and agreed to schedule her next follow up appointment today.  Attestation Statements:   Reviewed by clinician on day of visit: allergies, medications, problem list, medical history, surgical history, family history, social history, and previous encounter notes.  I, Bethany Lynch, CMA, am acting as Insurance claims handler for Energy manager, DO  I have reviewed the above documentation for accuracy and completeness, and I  agree with the above. [employer], DO

## 2021-02-15 ENCOUNTER — Other Ambulatory Visit: Payer: Self-pay | Admitting: Cardiology

## 2021-02-24 ENCOUNTER — Other Ambulatory Visit: Payer: Self-pay

## 2021-02-24 ENCOUNTER — Encounter (INDEPENDENT_AMBULATORY_CARE_PROVIDER_SITE_OTHER): Payer: Self-pay | Admitting: Bariatrics

## 2021-02-24 ENCOUNTER — Ambulatory Visit (INDEPENDENT_AMBULATORY_CARE_PROVIDER_SITE_OTHER): Payer: Medicare Other | Admitting: Bariatrics

## 2021-02-24 VITALS — BP 147/63 | HR 61 | Temp 97.6°F | Ht 64.0 in | Wt 324.0 lb

## 2021-02-24 DIAGNOSIS — E66813 Obesity, class 3: Secondary | ICD-10-CM

## 2021-02-24 DIAGNOSIS — E1169 Type 2 diabetes mellitus with other specified complication: Secondary | ICD-10-CM | POA: Diagnosis not present

## 2021-02-24 DIAGNOSIS — R600 Localized edema: Secondary | ICD-10-CM

## 2021-02-24 DIAGNOSIS — E785 Hyperlipidemia, unspecified: Secondary | ICD-10-CM

## 2021-02-24 DIAGNOSIS — Z6841 Body Mass Index (BMI) 40.0 and over, adult: Secondary | ICD-10-CM

## 2021-02-24 DIAGNOSIS — E669 Obesity, unspecified: Secondary | ICD-10-CM

## 2021-03-03 ENCOUNTER — Encounter (INDEPENDENT_AMBULATORY_CARE_PROVIDER_SITE_OTHER): Payer: Self-pay | Admitting: Bariatrics

## 2021-03-03 NOTE — Progress Notes (Signed)
Chief Complaint:   OBESITY Bethany Lynch is here to discuss her progress with her obesity treatment plan along with follow-up of her obesity related diagnoses. Bethany Lynch is on the Category 2 Plan and states she is following her eating plan approximately 90% of the time. Bethany Lynch states she is water aerobics for 45 minutes 2 times per week, and walking with weights for 30 minutes 2 times per week.  Today's visit was #: 3 Starting weight: 339 lbs Starting date: 01/28/2021 Today's weight: 324 lbs Today's date: 02/24/2021 Total lbs lost to date: 15 Total lbs lost since last in-office visit: 3  Interim History: Bethany Lynch's weight is down an additional 3 lbs and he is doing well overall.  Subjective:   1. Diabetes mellitus type 2 in obese (HCC) Bethany Lynch's fasting BGs range between 100 and 134, with occasional readings in the 150's. He is taking insulin with meals, Bethany Lynch, Farxiga, and metformin.  2. Hyperlipidemia associated with type 2 diabetes mellitus (HCC) Bethany Lynch is currently taking Crestor.  3. Bilateral lower extremity edema Bethany Lynch is wearing support stockings.  Assessment/Plan:   1. Diabetes mellitus type 2 in obese (HCC) Good blood sugar control is important to decrease the likelihood of diabetic complications such as nephropathy, neuropathy, limb loss, blindness, coronary artery disease, and death. Intensive lifestyle modification including diet, exercise and weight loss are the first line of treatment for diabetes. Bethany Lynch will continue his medications, and we will continue to follow up as directed.  2. Hyperlipidemia associated with type 2 diabetes mellitus (HCC) Cardiovascular risk and specific lipid/LDL goals reviewed. We discussed several lifestyle modifications today. Bethany Lynch will continue Crestor, and will continue to work on diet, exercise and weight loss efforts. No trans fats. Orders and follow up as documented in patient record.   Counseling Intensive lifestyle modifications are  the first line treatment for this issue. . Dietary changes: Increase soluble fiber. Decrease simple carbohydrates. . Exercise changes: Moderate to vigorous-intensity aerobic activity 150 minutes per week if tolerated. . Lipid-lowering medications: see documented in medical record.  3. Bilateral lower extremity edema Bethany Lynch will continue to wear his support hoses.   4. Obesity current BMI 55 Bethany Lynch is currently in the action stage of change. As such, her goal is to continue with weight loss efforts. She has agreed to the Category 2 Plan.   Bethany Lynch will continue to adhere closely to the meal plan.  Exercise goals: As is.  Behavioral modification strategies: increasing lean protein intake, decreasing simple carbohydrates, increasing vegetables, increasing water intake, decreasing eating out, no skipping meals, meal planning and cooking strategies, keeping healthy foods in the home and planning for success.  Bethany Lynch has agreed to follow-up with our clinic in 2 weeks. She was informed of the importance of frequent follow-up visits to maximize her success with intensive lifestyle modifications for her multiple health conditions.   Objective:   Blood pressure (!) 147/63, pulse 61, temperature 97.6 F (36.4 C), height 5\' 4"  (1.626 m), weight (!) 324 lb (147 kg), SpO2 94 %. Body mass index is 55.61 kg/m.  General: Cooperative, alert, well developed, in no acute distress. HEENT: Conjunctivae and lids unremarkable. Cardiovascular: Regular rhythm.  Lungs: Normal work of breathing. Neurologic: No focal deficits.   Lab Results  Component Value Date   CREATININE 1.26 (H) 01/21/2021   BUN 31 (H) 01/21/2021   NA 141 01/21/2021   K 5.2 01/21/2021   CL 101 01/21/2021   CO2 20 01/21/2021   No results found for: ALT, AST,  GGT, ALKPHOS, BILITOT Lab Results  Component Value Date   HGBA1C 6.3 (H) 01/28/2021   Lab Results  Component Value Date   INSULIN 3.7 01/28/2021   Lab Results  Component  Value Date   TSH 2.830 09/28/2018   No results found for: CHOL, HDL, LDLCALC, LDLDIRECT, TRIG, CHOLHDL No results found for: WBC, HGB, HCT, MCV, PLT No results found for: IRON, TIBC, FERRITIN  Obesity Behavioral Intervention:   Approximately 15 minutes were spent on the discussion below.  ASK: We discussed the diagnosis of obesity with Bethany Lynch today and Bethany Lynch agreed to give [place] permission to discuss obesity behavioral modification therapy today.  ASSESS: Bethany Lynch has the diagnosis of obesity and her BMI today is 55.59. Bethany Lynch is in the action stage of change.   ADVISE: Bethany Lynch was educated on the multiple health risks of obesity as well as the benefit of weight loss to improve her health. She was advised of the need for long term treatment and the importance of lifestyle modifications to improve her current health and to decrease her risk of future health problems.  AGREE: Multiple dietary modification options and treatment options were discussed and Bethany Lynch agreed to follow the recommendations documented in the above note.  ARRANGE: Bethany Lynch was educated on the importance of frequent visits to treat obesity as outlined per CMS and USPSTF guidelines and agreed to schedule her next follow up appointment today.  Attestation Statements:   Reviewed by clinician on day of visit: allergies, medications, problem list, medical history, surgical history, family history, social history, and previous encounter notes.   Bethany Lynch, am acting as Energy manager for [employer], DO.  I have reviewed the above documentation for accuracy and completeness, and I agree with the above. [name], DO

## 2021-03-10 ENCOUNTER — Encounter (INDEPENDENT_AMBULATORY_CARE_PROVIDER_SITE_OTHER): Payer: Self-pay | Admitting: Bariatrics

## 2021-03-10 ENCOUNTER — Ambulatory Visit (INDEPENDENT_AMBULATORY_CARE_PROVIDER_SITE_OTHER): Payer: Medicare Other | Admitting: Bariatrics

## 2021-03-10 ENCOUNTER — Other Ambulatory Visit: Payer: Self-pay

## 2021-03-10 VITALS — BP 148/66 | HR 55 | Temp 97.5°F | Ht 64.0 in | Wt 319.0 lb

## 2021-03-10 DIAGNOSIS — E1159 Type 2 diabetes mellitus with other circulatory complications: Secondary | ICD-10-CM

## 2021-03-10 DIAGNOSIS — I152 Hypertension secondary to endocrine disorders: Secondary | ICD-10-CM

## 2021-03-10 DIAGNOSIS — E785 Hyperlipidemia, unspecified: Secondary | ICD-10-CM

## 2021-03-10 DIAGNOSIS — Z6841 Body Mass Index (BMI) 40.0 and over, adult: Secondary | ICD-10-CM

## 2021-03-10 DIAGNOSIS — E1169 Type 2 diabetes mellitus with other specified complication: Secondary | ICD-10-CM

## 2021-03-10 DIAGNOSIS — E669 Obesity, unspecified: Secondary | ICD-10-CM

## 2021-03-13 ENCOUNTER — Encounter (INDEPENDENT_AMBULATORY_CARE_PROVIDER_SITE_OTHER): Payer: Self-pay | Admitting: Bariatrics

## 2021-03-13 NOTE — Progress Notes (Signed)
Chief Complaint:   OBESITY Bethany Lynch is here to discuss her progress with her obesity treatment plan along with follow-up of her obesity related diagnoses. Bethany Lynch is on the Category 2 Plan and states she is following her eating plan approximately 90% of the time. Tobin states she is doing water aerobics for 45 minutes 1 time per week.  Today's visit was #: 4 Starting weight: 339 lbs Starting date: 01/28/2021 Today's weight: 319 lbs Today's date: 03/10/2021 Total lbs lost to date: 20 lbs Total lbs lost since last in-office visit: 5 lbs  Interim History: Bethany Lynch is down an additional 5 pounds and has done very well overall.  Subjective:   1. Hypertension associated with type 2 diabetes mellitus (HCC) Review: taking medications as instructed, no medication side effects noted, no chest pain on exertion, no dyspnea on exertion, no swelling of ankles.  Taking Micardis.  BP Readings from Last 3 Encounters:  03/10/21 (!) 148/66  02/24/21 (!) 147/63  02/11/21 140/82   2. Hyperlipidemia associated with type 2 diabetes mellitus (HCC) Bethany Lynch has hyperlipidemia and has been trying to improve her cholesterol levels with intensive lifestyle modification including a low saturated fat diet, exercise and weight loss. She denies any chest pain, claudication or myalgias.  Taking Crestor.  3. Diabetes mellitus type 2 in obese [facility]) Denies lows.  FBS 80s-130s.  His 2 hour postprandials range 79-121.  Lab Results  Component Value Date   HGBA1C 6.3 (H) 01/28/2021   Lab Results  Component Value Date   CREATININE 1.26 (H) 01/21/2021   Lab Results  Component Value Date   INSULIN 3.7 01/28/2021   Assessment/Plan:   1. Hypertension associated with type 2 diabetes mellitus (HCC) Cola is working on healthy weight loss and exercise to improve blood pressure control. We will watch for signs of hypotension as she continues her lifestyle modifications.  Continue medications.  Continue to work on diet  and exercise.  2. Hyperlipidemia associated with type 2 diabetes mellitus (HCC) Cardiovascular risk and specific lipid/LDL goals reviewed.  We discussed several lifestyle modifications today and Bethany Lynch will continue to work on diet, exercise and weight loss efforts. Orders and follow up as documented in patient record.  Continue Crestor.  Counseling Intensive lifestyle modifications are the first line treatment for this issue. . Dietary changes: Increase soluble fiber. Decrease simple carbohydrates. . Exercise changes: Moderate to vigorous-intensity aerobic activity 150 minutes per week if tolerated. . Lipid-lowering medications: see documented in medical record.  3. Diabetes mellitus type 2 in obese (HCC) Good blood sugar control is important to decrease the likelihood of diabetic complications such as nephropathy, neuropathy, limb loss, blindness, coronary artery disease, and death. Intensive lifestyle modification including diet, exercise and weight loss are the first line of treatment for diabetes.  Continue medications.   4. Obesity, current BMI 54  Bethany Lynch is currently in the action stage of change. As such, her goal is to continue with weight loss efforts. She has agreed to the Category 2 Plan.   He will work on meal planning and intentional eating.  Recipes II handout provided today.  Exercise goals: As is.  Behavioral modification strategies: increasing lean protein intake, decreasing simple carbohydrates, increasing vegetables, increasing water intake, decreasing eating out, no skipping meals, meal planning and cooking strategies, keeping healthy foods in the home and planning for success.  Bethany Lynch has agreed to follow-up with our clinic in 2 weeks. She was informed of the importance of frequent follow-up visits to maximize her  success with intensive lifestyle modifications for her multiple health conditions.   Objective:   Blood pressure (!) 148/66, pulse (!) 55, temperature (!)  97.5 F (36.4 C), height 5\' 4"  (1.626 m), weight (!) 319 lb (144.7 kg), SpO2 92 %. Body mass index is 54.76 kg/m.  General: Cooperative, alert, well developed, in no acute distress. HEENT: Conjunctivae and lids unremarkable. Cardiovascular: Regular rhythm.  Lungs: Normal work of breathing. Neurologic: No focal deficits.   Lab Results  Component Value Date   CREATININE 1.26 (H) 01/21/2021   BUN 31 (H) 01/21/2021   NA 141 01/21/2021   K 5.2 01/21/2021   CL 101 01/21/2021   CO2 20 01/21/2021   Lab Results  Component Value Date   HGBA1C 6.3 (H) 01/28/2021   Lab Results  Component Value Date   INSULIN 3.7 01/28/2021   Lab Results  Component Value Date   TSH 2.830 09/28/2018   Obesity Behavioral Intervention:   Approximately 15 minutes were spent on the discussion below.  ASK: We discussed the diagnosis of obesity with Berna today and [place] agreed to give Bethany Lynch permission to discuss obesity behavioral modification therapy today.  ASSESS: Bethany Lynch has the diagnosis of obesity and her BMI today is 54.8. Bethany Lynch is in the action stage of change.   ADVISE: Bethany Lynch was educated on the multiple health risks of obesity as well as the benefit of weight loss to improve her health. She was advised of the need for long term treatment and the importance of lifestyle modifications to improve her current health and to decrease her risk of future health problems.  AGREE: Multiple dietary modification options and treatment options were discussed and Bethany Lynch agreed to follow the recommendations documented in the above note.  ARRANGE: Bethany Lynch was educated on the importance of frequent visits to treat obesity as outlined per CMS and USPSTF guidelines and agreed to schedule her next follow up appointment today.  Attestation Statements:   Reviewed by clinician on day of visit: allergies, medications, problem list, medical history, surgical history, family history, social history, and previous  encounter notes.  I, Bethany Lynch, CMA, am acting as Insurance claims handler for Energy manager, DO  I have reviewed the above documentation for accuracy and completeness, and I agree with the above. [employer], DO

## 2021-04-01 ENCOUNTER — Ambulatory Visit (INDEPENDENT_AMBULATORY_CARE_PROVIDER_SITE_OTHER): Payer: Medicare Other | Admitting: Bariatrics

## 2021-04-01 ENCOUNTER — Encounter (INDEPENDENT_AMBULATORY_CARE_PROVIDER_SITE_OTHER): Payer: Self-pay | Admitting: Bariatrics

## 2021-04-01 ENCOUNTER — Other Ambulatory Visit: Payer: Self-pay

## 2021-04-01 VITALS — BP 143/67 | HR 73 | Temp 98.2°F | Ht 64.0 in | Wt 316.0 lb

## 2021-04-01 DIAGNOSIS — E1159 Type 2 diabetes mellitus with other circulatory complications: Secondary | ICD-10-CM

## 2021-04-01 DIAGNOSIS — Z6841 Body Mass Index (BMI) 40.0 and over, adult: Secondary | ICD-10-CM | POA: Diagnosis not present

## 2021-04-01 DIAGNOSIS — E1169 Type 2 diabetes mellitus with other specified complication: Secondary | ICD-10-CM

## 2021-04-01 DIAGNOSIS — I152 Hypertension secondary to endocrine disorders: Secondary | ICD-10-CM | POA: Diagnosis not present

## 2021-04-01 DIAGNOSIS — E669 Obesity, unspecified: Secondary | ICD-10-CM

## 2021-04-02 ENCOUNTER — Encounter (INDEPENDENT_AMBULATORY_CARE_PROVIDER_SITE_OTHER): Payer: Self-pay | Admitting: Bariatrics

## 2021-04-02 NOTE — Progress Notes (Signed)
Chief Complaint:   OBESITY Bethany Lynch is here to discuss her progress with her obesity treatment plan along with follow-up of her obesity related diagnoses. Bethany Lynch is on the Category 2 Plan and states she is following her eating plan approximately 85-90% of the time. Bethany Lynch states she is not currently exercising.  Today's visit was #: 5 Starting weight: 339 lbs Starting date: 01/28/2021 Today's weight: 316 lbs Today's date: 04/01/2021 Total lbs lost to date: 23 Total lbs lost since last in-office visit: 3  Interim History: Bethany Lynch is down an additional 3 lbs and doing well overall. She has been on vacation. She is drinking more water.  Subjective:   1. Diabetes mellitus type 2 in obese [facility]) Bethany Lynch is taking [place], insulin Tresiba, and Metformin. FBS 100-120's, with highest 136 and no lows.  2. Hypertension associated with type 2 diabetes mellitus (HCC) BP reasonably well controlled.  Assessment/Plan:   1. Diabetes mellitus type 2 in obese (HCC) Good blood sugar control is important to decrease the likelihood of diabetic complications such as nephropathy, neuropathy, limb loss, blindness, coronary artery disease, and death. Intensive lifestyle modification including diet, exercise and weight loss are the first line of treatment for diabetes. Will continue medications.  2. Hypertension associated with type 2 diabetes mellitus (HCC) Bethany Lynch is working on healthy weight loss and exercise to improve blood pressure control. We will watch for signs of hypotension as she continues her lifestyle modifications. Continue medications.  3. Obesity, current BMI 54  Bethany Lynch is currently in the action stage of change. As such, her goal is to continue with weight loss efforts. She has agreed to the Category 2 Plan.   Meal plan Intentional eating  Exercise goals: As is  Behavioral modification strategies: increasing lean protein intake, decreasing simple carbohydrates, increasing vegetables,  increasing water intake, decreasing eating out, no skipping meals, meal planning and cooking strategies, keeping healthy foods in the home and planning for success.  Bethany Lynch has agreed to follow-up with our clinic in 2 weeks. She was informed of the importance of frequent follow-up visits to maximize her success with intensive lifestyle modifications for her multiple health conditions.   Objective:   Blood pressure (!) 143/67, pulse 73, temperature 98.2 F (36.8 C), height 5\' 4"  (1.626 m), weight (!) 316 lb (143.3 kg), SpO2 96 %. Body mass index is 54.24 kg/m.  General: Cooperative, alert, well developed, in no acute distress. HEENT: Conjunctivae and lids unremarkable. Cardiovascular: Regular rhythm.  Lungs: Normal work of breathing. Neurologic: No focal deficits.   Lab Results  Component Value Date   CREATININE 1.26 (H) 01/21/2021   BUN 31 (H) 01/21/2021   NA 141 01/21/2021   K 5.2 01/21/2021   CL 101 01/21/2021   CO2 20 01/21/2021   No results found for: ALT, AST, GGT, ALKPHOS, BILITOT Lab Results  Component Value Date   HGBA1C 6.3 (H) 01/28/2021   Lab Results  Component Value Date   INSULIN 3.7 01/28/2021   Lab Results  Component Value Date   TSH 2.830 09/28/2018   No results found for: CHOL, HDL, LDLCALC, LDLDIRECT, TRIG, CHOLHDL No results found for: WBC, HGB, HCT, MCV, PLT No results found for: IRON, TIBC, FERRITIN  Obesity Behavioral Intervention:   Approximately 15 minutes were spent on the discussion below.  ASK: We discussed the diagnosis of obesity with Bethany Lynch today and Bethany Lynch agreed to give Bethany Lynch permission to discuss obesity behavioral modification therapy today.  ASSESS: Bethany Lynch has the diagnosis of obesity and  her BMI today is 54.3. Bethany Lynch is in the action stage of change.   ADVISE: Bethany Lynch was educated on the multiple health risks of obesity as well as the benefit of weight loss to improve her health. She was advised of the need for long term treatment  and the importance of lifestyle modifications to improve her current health and to decrease her risk of future health problems.  AGREE: Multiple dietary modification options and treatment options were discussed and Bethany Lynch agreed to follow the recommendations documented in the above note.  ARRANGE: Bethany Lynch was educated on the importance of frequent visits to treat obesity as outlined per CMS and USPSTF guidelines and agreed to schedule her next follow up appointment today.  Attestation Statements:   Reviewed by clinician on day of visit: allergies, medications, problem list, medical history, surgical history, family history, social history, and previous encounter notes.  Edmund Hilda, CMA, am acting as Energy manager for [employer], DO.  I have reviewed the above documentation for accuracy and completeness, and I agree with the above. [name], DO

## 2021-04-16 ENCOUNTER — Ambulatory Visit: Payer: Medicare Other | Admitting: Cardiology

## 2021-04-16 ENCOUNTER — Other Ambulatory Visit: Payer: Self-pay

## 2021-04-16 ENCOUNTER — Encounter (INDEPENDENT_AMBULATORY_CARE_PROVIDER_SITE_OTHER): Payer: Self-pay | Admitting: Bariatrics

## 2021-04-16 ENCOUNTER — Ambulatory Visit (INDEPENDENT_AMBULATORY_CARE_PROVIDER_SITE_OTHER): Payer: Medicare Other | Admitting: Bariatrics

## 2021-04-16 VITALS — BP 131/73 | HR 56 | Temp 97.7°F | Ht 64.0 in | Wt 314.0 lb

## 2021-04-16 DIAGNOSIS — E785 Hyperlipidemia, unspecified: Secondary | ICD-10-CM | POA: Diagnosis not present

## 2021-04-16 DIAGNOSIS — K5909 Other constipation: Secondary | ICD-10-CM

## 2021-04-16 DIAGNOSIS — E1169 Type 2 diabetes mellitus with other specified complication: Secondary | ICD-10-CM

## 2021-04-16 DIAGNOSIS — E1159 Type 2 diabetes mellitus with other circulatory complications: Secondary | ICD-10-CM

## 2021-04-16 DIAGNOSIS — Z794 Long term (current) use of insulin: Secondary | ICD-10-CM

## 2021-04-16 DIAGNOSIS — I152 Hypertension secondary to endocrine disorders: Secondary | ICD-10-CM

## 2021-04-16 DIAGNOSIS — Z6841 Body Mass Index (BMI) 40.0 and over, adult: Secondary | ICD-10-CM | POA: Diagnosis not present

## 2021-04-21 NOTE — Progress Notes (Signed)
Chief Complaint:   OBESITY Bethany Lynch is here to discuss her progress with her obesity treatment plan along with follow-up of her obesity related diagnoses. Bethany Lynch is on the Category 2 Plan and states she is following her eating plan approximately 80-90% of the time. Bethany Lynch states she is doing water aerobics and walking for 120 minutes 1 time per week.  Today's visit was #: 6 Starting weight: 339 lbs Starting date: 01/28/2021 Today's weight: 314 lbs Today's date: 04/16/2021 Total lbs lost to date: 25 lbs Total lbs lost since last in-office visit: 2 lbs  Interim History: Bethany Lynch is down an additional 2 pounds since her last visit.  She is getting in adequate water.  She is also drinking diet soda.  Subjective:   1. Hyperlipidemia associated with type 2 diabetes mellitus (HCC) Bethany Lynch has hyperlipidemia and has been trying to improve her cholesterol levels with intensive lifestyle modification including a low saturated fat diet, exercise and weight loss. She denies any chest pain, claudication or myalgias.  Bethany Lynch is taking Crestor.  2. Hypertension associated with type 2 diabetes mellitus (HCC) Review: taking medications as instructed, no medication side effects noted, no chest pain on exertion, no dyspnea on exertion, no swelling of ankles.  Taking Micardis, Apresoline, and Calan-SR.  BP Readings from Last 3 Encounters:  04/16/21 131/73  04/01/21 (!) 143/67  03/10/21 (!) 148/66   3. Other constipation Bethany Lynch is taking a stool softener and Milk of Magnesia.  4. Type 2 diabetes mellitus with other specified complication, with long-term current use of insulin (HCC) FBS 100-120s.  Highest 140.  No lows <70.  Lab Results  Component Value Date   HGBA1C 6.3 (H) 01/28/2021   Lab Results  Component Value Date   CREATININE 1.26 (H) 01/21/2021   Lab Results  Component Value Date   INSULIN 3.7 01/28/2021   Assessment/Plan:   1. Hyperlipidemia associated with type 2 diabetes  mellitus (HCC) Cardiovascular risk and specific lipid/LDL goals reviewed.  We discussed several lifestyle modifications today and Traniya will continue to work on diet, exercise and weight loss efforts. Orders and follow up as documented in patient record. Continue Crestor.  Counseling Intensive lifestyle modifications are the first line treatment for this issue. . Dietary changes: Increase soluble fiber. Decrease simple carbohydrates. . Exercise changes: Moderate to vigorous-intensity aerobic activity 150 minutes per week if tolerated. . Lipid-lowering medications: see documented in medical record.  2. Hypertension associated with type 2 diabetes mellitus (HCC) Bethany Lynch is working on healthy weight loss and exercise to improve blood pressure control. We will watch for signs of hypotension as she continues her lifestyle modifications.  No added salt.  Continue medications.   3. Other constipation Bethany Lynch was informed that a decrease in bowel movement frequency is normal while losing weight, but stools should not be hard or painful. Orders and follow up as documented in patient record.  Take MiraLAX or Citrucel with a full glass of water.  Counseling Getting to Good Bowel Health: Your goal is to have one soft bowel movement each day. Drink at least 8 glasses of water each day. Eat plenty of fiber (goal is over 25 grams each day). It is best to get most of your fiber from dietary sources which includes leafy green vegetables, fresh fruit, and whole grains. You may need to add fiber with the help of OTC fiber supplements. These include Metamucil, Citrucel, and Flaxseed. If you are still having trouble, try adding Miralax or Magnesium Citrate. If all  of these changes do not work, Dietitian.  4. Type 2 diabetes mellitus with other specified complication, with long-term current use of insulin (HCC) Good blood sugar control is important to decrease the likelihood of diabetic complications such  as nephropathy, neuropathy, limb loss, blindness, coronary artery disease, and death. Intensive lifestyle modification including diet, exercise and weight loss are the first line of treatment for diabetes. Continue medications.  5. Obesity, current BMI 53 Bethany Lynch is currently in the action stage of change. As such, her goal is to continue with weight loss efforts. She has agreed to the Category 2 Plan.   She will work on meal planning and intentional eating.  Exercise goals: For substantial health benefits, adults should do at least 150 minutes (2 hours and 30 minutes) a week of moderate-intensity, or 75 minutes (1 hour and 15 minutes) a week of vigorous-intensity aerobic physical activity, or an equivalent combination of moderate- and vigorous-intensity aerobic activity. Aerobic activity should be performed in episodes of at least 10 minutes, and preferably, it should be spread throughout the week.  Behavioral modification strategies: increasing lean protein intake, decreasing simple carbohydrates, increasing vegetables, increasing water intake, decreasing eating out, no skipping meals, meal planning and cooking strategies, keeping healthy foods in the home and planning for success.  Bethany Lynch has agreed to follow-up with our clinic in 2 weeks. She was informed of the importance of frequent follow-up visits to maximize her success with intensive lifestyle modifications for her multiple health conditions.   Objective:   Blood pressure 131/73, pulse (!) 56, temperature 97.7 F (36.5 C), height 5\' 4"  (1.626 m), weight (!) 314 lb (142.4 kg), SpO2 97 %. Body mass index is 53.9 kg/m.  General: Cooperative, alert, well developed, in no acute distress. HEENT: Conjunctivae and lids unremarkable. Cardiovascular: Regular rhythm.  Lungs: Normal work of breathing. Neurologic: No focal deficits.   Lab Results  Component Value Date   CREATININE 1.26 (H) 01/21/2021   BUN 31 (H) 01/21/2021   NA 141  01/21/2021   K 5.2 01/21/2021   CL 101 01/21/2021   CO2 20 01/21/2021   Lab Results  Component Value Date   HGBA1C 6.3 (H) 01/28/2021   Lab Results  Component Value Date   INSULIN 3.7 01/28/2021   Lab Results  Component Value Date   TSH 2.830 09/28/2018   Obesity Behavioral Intervention:   Approximately 15 minutes were spent on the discussion below.  ASK: We discussed the diagnosis of obesity with Deseri today and Jazlyn agreed to give [name] permission to discuss obesity behavioral modification therapy today.  ASSESS: Safire has the diagnosis of obesity and her BMI today is 53.9. Evola is in the action stage of change.   ADVISE: Kirsti was educated on the multiple health risks of obesity as well as the benefit of weight loss to improve her health. She was advised of the need for long term treatment and the importance of lifestyle modifications to improve her current health and to decrease her risk of future health problems.  AGREE: Multiple dietary modification options and treatment options were discussed and Danea agreed to follow the recommendations documented in the above note.  ARRANGE: Xela was educated on the importance of frequent visits to treat obesity as outlined per CMS and USPSTF guidelines and agreed to schedule her next follow up appointment today.  Attestation Statements:   Reviewed by clinician on day of visit: allergies, medications, problem list, medical history, surgical history, family history, social history, and previous  encounter notes.  I, Insurance claims handler, CMA, am acting as Energy manager for [employer], DO  I have reviewed the above documentation for accuracy and completeness, and I agree with the above. [name], DO

## 2021-05-07 ENCOUNTER — Ambulatory Visit (INDEPENDENT_AMBULATORY_CARE_PROVIDER_SITE_OTHER): Payer: Medicare Other | Admitting: Adult Health

## 2021-05-07 ENCOUNTER — Other Ambulatory Visit: Payer: Self-pay

## 2021-05-07 ENCOUNTER — Encounter (INDEPENDENT_AMBULATORY_CARE_PROVIDER_SITE_OTHER): Payer: Self-pay | Admitting: Adult Health

## 2021-05-07 VITALS — BP 120/58 | HR 54 | Temp 97.9°F | Ht 64.0 in | Wt 310.0 lb

## 2021-05-07 DIAGNOSIS — E1169 Type 2 diabetes mellitus with other specified complication: Secondary | ICD-10-CM

## 2021-05-07 DIAGNOSIS — K5909 Other constipation: Secondary | ICD-10-CM

## 2021-05-07 DIAGNOSIS — I6522 Occlusion and stenosis of left carotid artery: Secondary | ICD-10-CM

## 2021-05-07 DIAGNOSIS — Z6841 Body Mass Index (BMI) 40.0 and over, adult: Secondary | ICD-10-CM

## 2021-05-07 DIAGNOSIS — Z794 Long term (current) use of insulin: Secondary | ICD-10-CM | POA: Diagnosis not present

## 2021-05-13 NOTE — Progress Notes (Signed)
Chief Complaint:   OBESITY Bethany Lynch is here to discuss her progress with her obesity treatment plan along with follow-up of her obesity related diagnoses. Bethany Lynch is on the Category 2 Plan and states she is following her eating plan approximately 85-90% of the time. [name] states she is doing water aerobics 45 minutes 1 times per week.  Today's visit was #: 7 Starting weight: 339 lbs Starting date: 01/28/2021 Today's weight: 310 lbs Today's date: 05/07/2021 Total lbs lost to date: 29 lbs Total lbs lost since last in-office visit: 4  Interim History: Bethany Lynch is trying to lose down to BMI <40 in order to have Left TKR. She reports decline in dyspnea on exertion with weight loss- great!  She denies cardiac symptoms or symptoms of hypotension.  She denies hematuria or hematochezia.  Subjective:   1. Other constipation Bethany Lynch has been using Miralax. Her PCP discontinued Equalactin and Bentyl at last OV 04/24/2021. She experienced black tarry stool and scheduled appt with GI- 05/21/2021- colonoscopy and endoscopy. Miralax is allowing for BM every other day. She denies current hematochezia.  2. Type 2 diabetes mellitus with other specified complication, with long-term current use of insulin (HCC) Veralyn is on Metformin 500 mg BID, Tresiba 80 units QD, and Novolog 9 units with meals.  Lab Results  Component Value Date   HGBA1C 6.3 (H) 01/28/2021   Lab Results  Component Value Date   CREATININE 1.26 (H) 01/21/2021   Lab Results  Component Value Date   INSULIN 3.7 01/28/2021    Assessment/Plan:   1. Other constipation Bethany Lynch was informed that a decrease in bowel movement frequency is normal while losing weight, but stools should not be hard or painful. Orders and follow up as documented in patient record.  -Continue to monitor stool color. Follow up with GI as directed.   Counseling Getting to Good Bowel Health: Your goal is to have one soft bowel movement each day. Drink at least 8  glasses of water each day. Eat plenty of fiber (goal is over 25 grams each day). It is best to get most of your fiber from dietary sources which includes leafy green vegetables, fresh fruit, and whole grains. You may need to add fiber with the help of OTC fiber supplements. These include Metamucil, Citrucel, and Flaxseed. If you are still having trouble, try adding Miralax or Magnesium Citrate. If all of these changes do not work, Dietitian.  2. Type 2 diabetes mellitus with other specified complication, with long-term current use of insulin (HCC) Good blood sugar control is important to decrease the likelihood of diabetic complications such as nephropathy, neuropathy, limb loss, blindness, coronary artery disease, and death. Intensive lifestyle modification including diet, exercise and weight loss are the first line of treatment for diabetes.  -Continue to closely monitor ambulatory BG. Continue current anti-diabetic regimen  3. Obesity, current BMI 53.3  Merit is currently in the action stage of change. As such, her goal is to continue with weight loss efforts. She has agreed to the Category 2 Plan.   Increase water intake, especially with warmer weather.  Exercise goals:  As is  Behavioral modification strategies: increasing lean protein intake, decreasing simple carbohydrates, meal planning and cooking strategies, keeping healthy foods in the home, better snacking choices, and planning for success.  Bethany Lynch has agreed to follow-up with our clinic in 3 weeks. She was informed of the importance of frequent follow-up visits to maximize her success with intensive lifestyle modifications for her  multiple health conditions.   Objective:   Blood pressure (!) 120/58, pulse (!) 54, temperature 97.9 F (36.6 C), height 5\' 4"  (1.626 m), weight (!) 310 lb (140.6 kg), SpO2 97 %. Body mass index is 53.21 kg/m.  General: Cooperative, alert, well developed, in no acute distress. HEENT:  Conjunctivae and lids unremarkable. Cardiovascular: Regular rhythm.  Lungs: Normal work of breathing. Neurologic: No focal deficits.   Lab Results  Component Value Date   CREATININE 1.26 (H) 01/21/2021   BUN 31 (H) 01/21/2021   NA 141 01/21/2021   K 5.2 01/21/2021   CL 101 01/21/2021   CO2 20 01/21/2021   No results found for: ALT, AST, GGT, ALKPHOS, BILITOT Lab Results  Component Value Date   HGBA1C 6.3 (H) 01/28/2021   Lab Results  Component Value Date   INSULIN 3.7 01/28/2021   Lab Results  Component Value Date   TSH 2.830 09/28/2018   No results found for: CHOL, HDL, LDLCALC, LDLDIRECT, TRIG, CHOLHDL No results found for: WBC, HGB, HCT, MCV, PLT No results found for: IRON, TIBC, FERRITIN  Attestation Statements:   Reviewed by clinician on day of visit: allergies, medications, problem list, medical history, surgical history, family history, social history, and previous encounter notes.  Time spent on visit including pre-visit chart review and post-visit care and charting was 30 minutes.   09/30/2018, CMA, am acting as transcriptionist for Bethany Hilda, NP.  I have reviewed the above documentation for accuracy and completeness, and I agree with the above. -  Charlissa Petros d. Athalene Kolle, NP-C

## 2021-05-28 ENCOUNTER — Encounter (INDEPENDENT_AMBULATORY_CARE_PROVIDER_SITE_OTHER): Payer: Self-pay | Admitting: Bariatrics

## 2021-05-28 ENCOUNTER — Other Ambulatory Visit: Payer: Self-pay

## 2021-05-28 ENCOUNTER — Ambulatory Visit (INDEPENDENT_AMBULATORY_CARE_PROVIDER_SITE_OTHER): Payer: Medicare Other | Admitting: Bariatrics

## 2021-05-28 VITALS — BP 143/69 | HR 53 | Temp 97.7°F | Ht 64.0 in | Wt 308.0 lb

## 2021-05-28 DIAGNOSIS — E785 Hyperlipidemia, unspecified: Secondary | ICD-10-CM | POA: Diagnosis not present

## 2021-05-28 DIAGNOSIS — Z6841 Body Mass Index (BMI) 40.0 and over, adult: Secondary | ICD-10-CM | POA: Diagnosis not present

## 2021-05-28 DIAGNOSIS — E1169 Type 2 diabetes mellitus with other specified complication: Secondary | ICD-10-CM | POA: Diagnosis not present

## 2021-05-28 DIAGNOSIS — E669 Obesity, unspecified: Secondary | ICD-10-CM

## 2021-05-30 NOTE — Progress Notes (Signed)
Chief Complaint:   OBESITY Bethany Lynch is here to discuss her progress with her obesity treatment plan along with follow-up of her obesity related diagnoses. Bethany Lynch is on the Category 2 Plan and states she is following her eating plan approximately 80-90% of the time. Bethany Lynch states she is doing aerobics and water walking 75 minutes 1-2 times per week.  Today's visit was #: 8 Starting weight: 339 lbs Starting date: 01/28/2021 Today's weight: 308 lbs Today's date:05/28/2021 Total lbs lost to date: 31 Total lbs lost since last in-office visit: 2  Interim History: Bethany Lynch is down an additional 2 lbs and has done well overall.  Subjective:   1. Diabetes mellitus type 2 in obese [facility]) Medications reviewed. Bethany Lynch is taking Metformin and NovoLog (Insulin).   Lab Results  Component Value Date   HGBA1C 6.3 (H) 01/28/2021   Lab Results  Component Value Date   CREATININE 1.26 (H) 01/21/2021   Lab Results  Component Value Date   INSULIN 3.7 01/28/2021   2. Hyperlipidemia associated with type 2 diabetes mellitus (HCC) Bethany Lynch is currently taking Crestor.  Assessment/Plan:   1. Diabetes mellitus type 2 in obese (HCC) Good blood sugar control is important to decrease the likelihood of diabetic complications such as nephropathy, neuropathy, limb loss, blindness, coronary artery disease, and death. Intensive lifestyle modification including diet, exercise and weight loss are the first line of treatment for diabetes. Bethany Lynch will decrease carbohydrates and continue exercise.  2. Hyperlipidemia associated with type 2 diabetes mellitus (HCC) Cardiovascular risk and specific lipid/LDL goals reviewed.  We discussed several lifestyle modifications today and Bethany Lynch will continue to work on diet, exercise and weight loss efforts. Orders and follow up as documented in patient record. Bethany Lynch will continue Crestor.  Counseling Intensive lifestyle modifications are the first line treatment for this  issue. Dietary changes: Increase soluble fiber. Decrease simple carbohydrates. Exercise changes: Moderate to vigorous-intensity aerobic activity 150 minutes per week if tolerated. Lipid-lowering medications: see documented in medical record.   3. Obesity, current BMI 53 Bethany Lynch is currently in the action stage of change. As such, her goal is to continue with weight loss efforts. She has agreed to the Category 2 Plan.   Meal planning Will continue to adhere closely to the plan Will increase water intake Exercise goals: As is.  Behavioral modification strategies: increasing lean protein intake, decreasing simple carbohydrates, increasing vegetables, increasing water intake, decreasing eating out, no skipping meals, meal planning and cooking strategies, keeping healthy foods in the home, and planning for success.  Bethany Lynch has agreed to follow-up with our clinic in 3 weeks. She was informed of the importance of frequent follow-up visits to maximize her success with intensive lifestyle modifications for her multiple health conditions  Objective:   Blood pressure (!) 143/69, pulse (!) 53, temperature 97.7 F (36.5 C), height 5\' 4"  (1.626 m), weight (!) 308 lb (139.7 kg), SpO2 100 %. Body mass index is 52.87 kg/m.  General: Cooperative, alert, well developed, in no acute distress. HEENT: Conjunctivae and lids unremarkable. Cardiovascular: Regular rhythm.  Lungs: Normal work of breathing. Neurologic: No focal deficits.   Lab Results  Component Value Date   CREATININE 1.26 (H) 01/21/2021   BUN 31 (H) 01/21/2021   NA 141 01/21/2021   K 5.2 01/21/2021   CL 101 01/21/2021   CO2 20 01/21/2021   No results found for: ALT, AST, GGT, ALKPHOS, BILITOT Lab Results  Component Value Date   HGBA1C 6.3 (H) 01/28/2021   Lab Results  Component Value Date   INSULIN 3.7 01/28/2021   Lab Results  Component Value Date   TSH 2.830 09/28/2018   No results found for: CHOL, HDL, LDLCALC,  LDLDIRECT, TRIG, CHOLHDL Lab Results  Component Value Date   VD25OH 54.8 01/28/2021   No results found for: WBC, HGB, HCT, MCV, PLT No results found for: IRON, TIBC, FERRITIN  Obesity Behavioral Intervention:   Approximately 15 minutes were spent on the discussion below.  ASK: We discussed the diagnosis of obesity with Bethany Lynch today and Bethany Lynch agreed to give [place] permission to discuss obesity behavioral modification therapy today.  ASSESS: Bethany Lynch has the diagnosis of obesity and her BMI today is 53. Bethany Lynch is in the action stage of change.   ADVISE: Bethany Lynch was educated on the multiple health risks of obesity as well as the benefit of weight loss to improve her health. She was advised of the need for long term treatment and the importance of lifestyle modifications to improve her current health and to decrease her risk of future health problems.  AGREE: Multiple dietary modification options and treatment options were discussed and Bethany Lynch agreed to follow the recommendations documented in the above note.  ARRANGE: Bethany Lynch was educated on the importance of frequent visits to treat obesity as outlined per CMS and USPSTF guidelines and agreed to schedule her next follow up appointment today.  Attestation Statements:   Reviewed by clinician on day of visit: allergies, medications, problem list, medical history, surgical history, family history, social history, and previous encounter notes.  IPaulla Fore, CMA, am acting as Energy manager for [employer], DO.  I have reviewed the above documentation for accuracy and completeness, and I agree with the above. [name], DO

## 2021-06-03 ENCOUNTER — Encounter (INDEPENDENT_AMBULATORY_CARE_PROVIDER_SITE_OTHER): Payer: Self-pay | Admitting: Bariatrics

## 2021-06-12 ENCOUNTER — Telehealth: Payer: Self-pay | Admitting: Cardiology

## 2021-06-12 DIAGNOSIS — K219 Gastro-esophageal reflux disease without esophagitis: Secondary | ICD-10-CM

## 2021-06-12 LAB — HM COLONOSCOPY

## 2021-06-12 NOTE — Telephone Encounter (Signed)
Husband is calling to report pt is currently at [facility] spoke with Dr. Servando Salina today and she advised they call and get her scheduled to come in for a hospital f/u. Dr. Bing Matter is booked until October at both hospitals. Please advise.

## 2021-06-12 NOTE — Telephone Encounter (Signed)
Appointment has been made and pt is aware.

## 2021-06-16 ENCOUNTER — Telehealth: Payer: Self-pay | Admitting: Cardiology

## 2021-06-16 NOTE — Telephone Encounter (Signed)
New message:     Patient would like to know if she can do a Virtual Visit on Thursday? She said her husband tested Positive for Covid.

## 2021-06-18 DIAGNOSIS — M549 Dorsalgia, unspecified: Secondary | ICD-10-CM | POA: Insufficient documentation

## 2021-06-18 DIAGNOSIS — R002 Palpitations: Secondary | ICD-10-CM | POA: Insufficient documentation

## 2021-06-18 DIAGNOSIS — E119 Type 2 diabetes mellitus without complications: Secondary | ICD-10-CM | POA: Insufficient documentation

## 2021-06-18 DIAGNOSIS — M2550 Pain in unspecified joint: Secondary | ICD-10-CM | POA: Insufficient documentation

## 2021-06-18 DIAGNOSIS — G9332 Myalgic encephalomyelitis/chronic fatigue syndrome: Secondary | ICD-10-CM | POA: Insufficient documentation

## 2021-06-18 DIAGNOSIS — M1990 Unspecified osteoarthritis, unspecified site: Secondary | ICD-10-CM | POA: Insufficient documentation

## 2021-06-18 DIAGNOSIS — K589 Irritable bowel syndrome without diarrhea: Secondary | ICD-10-CM | POA: Insufficient documentation

## 2021-06-18 DIAGNOSIS — F419 Anxiety disorder, unspecified: Secondary | ICD-10-CM | POA: Insufficient documentation

## 2021-06-18 DIAGNOSIS — R1310 Dysphagia, unspecified: Secondary | ICD-10-CM | POA: Insufficient documentation

## 2021-06-18 DIAGNOSIS — R5382 Chronic fatigue, unspecified: Secondary | ICD-10-CM | POA: Insufficient documentation

## 2021-06-18 DIAGNOSIS — K5900 Constipation, unspecified: Secondary | ICD-10-CM | POA: Insufficient documentation

## 2021-06-18 DIAGNOSIS — K219 Gastro-esophageal reflux disease without esophagitis: Secondary | ICD-10-CM | POA: Insufficient documentation

## 2021-06-18 DIAGNOSIS — R5383 Other fatigue: Secondary | ICD-10-CM | POA: Insufficient documentation

## 2021-06-18 DIAGNOSIS — R600 Localized edema: Secondary | ICD-10-CM | POA: Insufficient documentation

## 2021-06-18 DIAGNOSIS — H409 Unspecified glaucoma: Secondary | ICD-10-CM | POA: Insufficient documentation

## 2021-06-19 ENCOUNTER — Telehealth (INDEPENDENT_AMBULATORY_CARE_PROVIDER_SITE_OTHER): Payer: Medicare Other | Admitting: Bariatrics

## 2021-06-19 ENCOUNTER — Encounter: Payer: Self-pay | Admitting: Cardiology

## 2021-06-19 ENCOUNTER — Ambulatory Visit: Payer: Medicare Other | Admitting: Cardiology

## 2021-06-19 ENCOUNTER — Encounter (INDEPENDENT_AMBULATORY_CARE_PROVIDER_SITE_OTHER): Payer: Self-pay | Admitting: Bariatrics

## 2021-06-19 DIAGNOSIS — E1169 Type 2 diabetes mellitus with other specified complication: Secondary | ICD-10-CM | POA: Diagnosis not present

## 2021-06-19 DIAGNOSIS — I10 Essential (primary) hypertension: Secondary | ICD-10-CM

## 2021-06-19 DIAGNOSIS — Z6841 Body Mass Index (BMI) 40.0 and over, adult: Secondary | ICD-10-CM

## 2021-06-19 DIAGNOSIS — E669 Obesity, unspecified: Secondary | ICD-10-CM

## 2021-06-19 NOTE — Telephone Encounter (Signed)
error 

## 2021-06-26 ENCOUNTER — Encounter (INDEPENDENT_AMBULATORY_CARE_PROVIDER_SITE_OTHER): Payer: Self-pay | Admitting: Bariatrics

## 2021-06-26 NOTE — Progress Notes (Addendum)
TeleHealth Visit:  Due to the COVID-19 pandemic, this visit was completed with telemedicine (audio/video) technology to reduce patient and provider exposure as well as to preserve personal protective equipment.   Bethany Lynch has verbally consented to this TeleHealth visit. The patient is located at home, the provider is located at the [facility] office. The participants in this visit include the listed provider and patient. The visit was conducted today via Mychart video.  Chief Complaint: OBESITY Bethany Lynch is here to discuss her progress with her obesity treatment plan along with follow-up of her obesity related diagnoses. Bethany Lynch is on the Category 2 Plan and states she is following her eating plan approximately 80 to 90% of the time. Bethany Lynch states she is doing water aerobics for 30 minutes 2 times per week.  Today's visit was #: 9 Starting weight: 339 lbs Starting date: 01/28/2021  Interim History: Bethany Lynch was not feeling well today. She has a sore throat and headaches. Bethany Lynch is positive for Covid and has connected with her PCP. She is getting adequate water and protein.  Subjective:   Diabetes mellitus type 2 in obese (HCC) Medications reviewed. Bethany Lynch's blood sugar ranges from 78 to 115.  Lab Results  Component Value Date   HGBA1C 6.3 (H) 01/28/2021   Lab Results  Component Value Date   CREATININE 1.26 (H) 01/21/2021   Lab Results  Component Value Date   INSULIN 3.7 01/28/2021    2.   Essential hypertension Blood presssures are not within limits.  BP Readings from Last 3 Encounters:  05/28/21 (!) 143/69  05/07/21 (!) 120/58  04/16/21 131/73   Lab Results  Component Value Date   CREATININE 1.26 (H) 01/21/2021   CREATININE 1.21 (H) 12/24/2020   CREATININE 1.14 (H) 11/27/2020    Assessment/Plan:   Diabetes mellitus type 2 in obese [facility]) Bethany Lynch agrees to continue taking her medication. Good blood sugar control is important to decrease the likelihood  of diabetic complications such as nephropathy, neuropathy, limb loss, blindness, coronary artery disease, and death. Intensive lifestyle modification including diet, exercise and weight loss are the first line of treatment for diabetes.    2.   Essential hypertension Bethany Lynch agrees to continue taking her medication. She is working on healthy weight loss and exercise to improve blood pressure control. We will watch for signs of hypotension as she continues her lifestyle modifications.  3.   Obesity, current BMI 53 Bethany Lynch agrees to continue meal planning, intentional eating, and increasing her water intake.  Bethany Lynch is currently in the action stage of change. As such, her goal is to continue with weight loss efforts. She has agreed to the Category 2 Plan.   Exercise goals:  Continue water aerobics for 30 minutes 2 times per week.  Behavioral modification strategies: increasing lean protein intake, decreasing simple carbohydrates, increasing vegetables, increasing water intake, decreasing eating out, no skipping meals, meal planning and cooking strategies, keeping healthy foods in the home, and planning for success.  Bethany Lynch has agreed to follow-up with our Bethany Lynch in 3 weeks. She was informed of the importance of frequent follow-up visits to maximize her success with intensive lifestyle modifications for her multiple health conditions.  Objective:   VITALS: Per patient if applicable, see vitals. GENERAL: Alert and in no acute distress. CARDIOPULMONARY: No increased WOB. Speaking in clear sentences.  PSYCH: Pleasant and cooperative. Speech normal rate and rhythm. Affect is appropriate. Insight and judgement are appropriate. Attention is focused, linear, and appropriate.  NEURO: Oriented  as arrived to appointment on time with no prompting.   Lab Results  Component Value Date   CREATININE 1.26 (H) 01/21/2021   BUN 31 (H) 01/21/2021   NA 141 01/21/2021   K 5.2 01/21/2021   CL 101 01/21/2021    CO2 20 01/21/2021   No results found for: ALT, AST, GGT, ALKPHOS, BILITOT Lab Results  Component Value Date   HGBA1C 6.3 (H) 01/28/2021   Lab Results  Component Value Date   INSULIN 3.7 01/28/2021   Lab Results  Component Value Date   TSH 2.830 09/28/2018   No results found for: CHOL, HDL, LDLCALC, LDLDIRECT, TRIG, CHOLHDL No results found for: WBC, HGB, HCT, MCV, PLT No results found for: IRON, TIBC, FERRITIN  Attestation Statements:   Reviewed by clinician on day of visit: allergies, medications, problem list, medical history, surgical history, family history, social history, and previous encounter notes.  Time spent on visit including pre-visit chart review and post-visit charting and care was 20 minutes.   IKirke Corin, CMA, am acting as transcriptionist for [employer], DO   I have reviewed the above documentation for accuracy and completeness, and I agree with the above. [name], DO

## 2021-07-10 ENCOUNTER — Encounter (INDEPENDENT_AMBULATORY_CARE_PROVIDER_SITE_OTHER): Payer: Self-pay | Admitting: Bariatrics

## 2021-07-10 ENCOUNTER — Other Ambulatory Visit: Payer: Self-pay

## 2021-07-10 ENCOUNTER — Ambulatory Visit (INDEPENDENT_AMBULATORY_CARE_PROVIDER_SITE_OTHER): Payer: Medicare Other | Admitting: Bariatrics

## 2021-07-10 VITALS — BP 142/75 | HR 57 | Temp 97.6°F | Ht 64.0 in | Wt 300.0 lb

## 2021-07-10 DIAGNOSIS — I152 Hypertension secondary to endocrine disorders: Secondary | ICD-10-CM | POA: Diagnosis not present

## 2021-07-10 DIAGNOSIS — E1169 Type 2 diabetes mellitus with other specified complication: Secondary | ICD-10-CM | POA: Diagnosis not present

## 2021-07-10 DIAGNOSIS — Z6841 Body Mass Index (BMI) 40.0 and over, adult: Secondary | ICD-10-CM | POA: Diagnosis not present

## 2021-07-10 DIAGNOSIS — E1159 Type 2 diabetes mellitus with other circulatory complications: Secondary | ICD-10-CM | POA: Diagnosis not present

## 2021-07-10 DIAGNOSIS — Z794 Long term (current) use of insulin: Secondary | ICD-10-CM | POA: Diagnosis not present

## 2021-07-14 NOTE — Progress Notes (Signed)
Chief Complaint:   OBESITY Karmah is here to discuss her progress with her obesity treatment plan along with follow-up of her obesity related diagnoses. Chrishana is on the Category 2 Plan and states she is following her eating plan approximately 50% of the time. Jaysa states she has not been exercising.   Today's visit was #: 10 Starting weight: 339 lbs Starting date: 01/28/2021 Today's weight: 300 lbs Today's date: 07/10/2021 Total lbs lost to date: 39 lbs Total lbs lost since last in-office visit: 8 lbs  Interim History: Daya is down 8 lbs since her last visit.  Subjective:   1. Type 2 diabetes mellitus with other specified complication, with long-term current use of insulin (HCC) Kamren is currently taking Metformin, Farxiga, Tresiba, and NovoLog.  Lab Results  Component Value Date   HGBA1C 6.3 (H) 01/28/2021   Lab Results  Component Value Date   CREATININE 1.26 (H) 01/21/2021   Lab Results  Component Value Date   INSULIN 3.7 01/28/2021   2. Hypertension associated with type 2 diabetes mellitus (HCC) Review: taking medications as instructed, no medication side effects noted, no chest pain on exertion, no dyspnea on exertion, no swelling of ankles. Reasonably well controlled.  BP Readings from Last 3 Encounters:  07/10/21 (!) 142/75  05/28/21 (!) 143/69  05/07/21 (!) 120/58   Assessment/Plan:   1. Type 2 diabetes mellitus with other specified complication, with long-term current use of insulin (HCC) Good blood sugar control is important to decrease the likelihood of diabetic complications such as nephropathy, neuropathy, limb loss, blindness, coronary artery disease, and death. Intensive lifestyle modification including diet, exercise and weight loss are the first line of treatment for diabetes. Karl will continue with her medicaitons.  2. Hypertension associated with type 2 diabetes mellitus (HCC) Nurah is working on healthy weight loss and exercise to  improve blood pressure control. We will watch for signs of hypotension as she continues her lifestyle modifications. She will continue with her medication.  3. Obesity, current BMI 51.6 Siara is currently in the action stage of change. As such, her goal is to continue with weight loss efforts. She has agreed to the Category 2 Plan.   Meal planning Will adhere closely to the plan  Exercise goals: Water aerobics.  Behavioral modification strategies: increasing lean protein intake, decreasing simple carbohydrates, increasing vegetables, increasing water intake, decreasing eating out, no skipping meals, meal planning and cooking strategies, keeping healthy foods in the home, and planning for success.  Latori has agreed to follow-up with our clinic in 3 weeks. She was informed of the importance of frequent follow-up visits to maximize her success with intensive lifestyle modifications for her multiple health conditions.   Objective:   Blood pressure (!) 142/75, pulse (!) 57, temperature 97.6 F (36.4 C), height 5\' 4"  (1.626 m), weight 300 lb (136.1 kg), SpO2 98 %. Body mass index is 51.49 kg/m.  General: Cooperative, alert, well developed, in no acute distress. HEENT: Conjunctivae and lids unremarkable. Cardiovascular: Regular rhythm.  Lungs: Normal work of breathing. Neurologic: No focal deficits.   Lab Results  Component Value Date   CREATININE 1.26 (H) 01/21/2021   BUN 31 (H) 01/21/2021   NA 141 01/21/2021   K 5.2 01/21/2021   CL 101 01/21/2021   CO2 20 01/21/2021   No results found for: ALT, AST, GGT, ALKPHOS, BILITOT Lab Results  Component Value Date   HGBA1C 6.3 (H) 01/28/2021   Lab Results  Component Value Date  INSULIN 3.7 01/28/2021   Lab Results  Component Value Date   TSH 2.830 09/28/2018   No results found for: CHOL, HDL, LDLCALC, LDLDIRECT, TRIG, CHOLHDL Lab Results  Component Value Date   VD25OH 54.8 01/28/2021   No results found for: WBC, HGB, HCT,  MCV, PLT No results found for: IRON, TIBC, FERRITIN  Obesity Behavioral Intervention:   Approximately 15 minutes were spent on the discussion below.  ASK: We discussed the diagnosis of obesity with Ellary today and Marleena agreed to give [place] permission to discuss obesity behavioral modification therapy today.  ASSESS: Shauntel has the diagnosis of obesity and her BMI today is 51.6. Makinsey is in the action stage of change.   ADVISE: Carolyna was educated on the multiple health risks of obesity as well as the benefit of weight loss to improve her health. She was advised of the need for long term treatment and the importance of lifestyle modifications to improve her current health and to decrease her risk of future health problems.  AGREE: Multiple dietary modification options and treatment options were discussed and Navneet agreed to follow the recommendations documented in the above note.  ARRANGE: Arliss was educated on the importance of frequent visits to treat obesity as outlined per CMS and USPSTF guidelines and agreed to schedule her next follow up appointment today.  Attestation Statements:   Reviewed by clinician on day of visit: allergies, medications, problem list, medical history, surgical history, family history, social history, and previous encounter notes.  IPaulla Fore, CMA, am acting as transcriptionist for Dr. [name], DO.  I have reviewed the above documentation for accuracy and completeness, and I agree with the above. [name], DO

## 2021-07-15 ENCOUNTER — Encounter (INDEPENDENT_AMBULATORY_CARE_PROVIDER_SITE_OTHER): Payer: Self-pay | Admitting: Bariatrics

## 2021-07-31 ENCOUNTER — Encounter (INDEPENDENT_AMBULATORY_CARE_PROVIDER_SITE_OTHER): Payer: Self-pay | Admitting: Bariatrics

## 2021-07-31 ENCOUNTER — Ambulatory Visit (INDEPENDENT_AMBULATORY_CARE_PROVIDER_SITE_OTHER): Payer: Medicare Other | Admitting: Bariatrics

## 2021-07-31 ENCOUNTER — Other Ambulatory Visit: Payer: Self-pay

## 2021-07-31 VITALS — BP 149/70 | HR 54 | Temp 97.5°F | Ht 64.0 in | Wt 302.0 lb

## 2021-07-31 DIAGNOSIS — Z794 Long term (current) use of insulin: Secondary | ICD-10-CM

## 2021-07-31 DIAGNOSIS — E1169 Type 2 diabetes mellitus with other specified complication: Secondary | ICD-10-CM

## 2021-07-31 DIAGNOSIS — E1159 Type 2 diabetes mellitus with other circulatory complications: Secondary | ICD-10-CM | POA: Diagnosis not present

## 2021-07-31 DIAGNOSIS — Z6841 Body Mass Index (BMI) 40.0 and over, adult: Secondary | ICD-10-CM | POA: Diagnosis not present

## 2021-07-31 DIAGNOSIS — I152 Hypertension secondary to endocrine disorders: Secondary | ICD-10-CM | POA: Diagnosis not present

## 2021-08-05 ENCOUNTER — Encounter (INDEPENDENT_AMBULATORY_CARE_PROVIDER_SITE_OTHER): Payer: Self-pay | Admitting: Bariatrics

## 2021-08-05 NOTE — Progress Notes (Signed)
Chief Complaint:   OBESITY Bethany Lynch is here to discuss her progress with her obesity treatment plan along with follow-up of her obesity related diagnoses. Bethany Lynch is on the Category 2 Plan and states she is following her eating plan approximately 65% of the time. Bethany Lynch states she is doing 0 minutes 0 times per week.  Today's visit was #: 11 Starting weight: 339 lbs Starting date: 01/28/2021 Today's weight: 302 lbs Today's date: 07/31/2021 Total lbs lost to date: 37 lbs Total lbs lost since last in-office visit: 0  Interim History: Bethany Lynch is up 2 lbs since her last visit.  Subjective:   1. Type 2 diabetes mellitus with other specified complication, with long-term current use of insulin (HCC) Bethany Lynch is taking [place], Novolog insulin , Tresiba and Metformin. Her A1C was 6.1 previously 6.3.  2. Hypertension associated with type 2 diabetes mellitus (HCC) Bethany Lynch's hypertension is controlled.  Assessment/Plan:   1. Type 2 diabetes mellitus with other specified complication, with long-term current use of insulin (HCC) Good blood sugar control is important to decrease the likelihood of diabetic complications such as nephropathy, neuropathy, limb loss, blindness, coronary artery disease, and death. Bethany Lynch will continue all medications. Intensive lifestyle modification including diet, exercise and weight loss are the first line of treatment for diabetes.   2. Hypertension associated with type 2 diabetes mellitus (HCC) Bethany Lynch will continue medications. She is working on healthy weight loss and exercise to improve blood pressure control. We will watch for signs of hypotension as she continues her lifestyle modifications.   3. Obesity, current BMI 51.8 Bethany Lynch is currently in the action stage of change. As such, her goal is to continue with weight loss efforts. She has agreed to the Category 2 Plan.   Bethany Lynch will continue meal planning. She will continue intentional eating. She will adhere to  the plan (80-85%). She will increase H2O and decrease soda. We will review labs today.  Exercise goals:  Bethany Lynch will begin swimming and water walking.  Behavioral modification strategies: increasing lean protein intake, decreasing simple carbohydrates, increasing vegetables, increasing water intake, decreasing eating out, no skipping meals, meal planning and cooking strategies, keeping healthy foods in the home, and planning for success.  Bethany Lynch has agreed to follow-up with our clinic in 2-3 weeks. She was informed of the importance of frequent follow-up visits to maximize her success with intensive lifestyle modifications for her multiple health conditions.   Objective:   Blood pressure (!) 149/70, pulse (!) 54, temperature (!) 97.5 F (36.4 C), height 5\' 4"  (1.626 m), weight (!) 302 lb (137 kg), SpO2 98 %. Body mass index is 51.84 kg/m.  General: Cooperative, alert, well developed, in no acute distress. HEENT: Conjunctivae and lids unremarkable. Cardiovascular: Regular rhythm.  Lungs: Normal work of breathing. Neurologic: No focal deficits.   Lab Results  Component Value Date   CREATININE 1.26 (H) 01/21/2021   BUN 31 (H) 01/21/2021   NA 141 01/21/2021   K 5.2 01/21/2021   CL 101 01/21/2021   CO2 20 01/21/2021   No results found for: ALT, AST, GGT, ALKPHOS, BILITOT Lab Results  Component Value Date   HGBA1C 6.3 (H) 01/28/2021   Lab Results  Component Value Date   INSULIN 3.7 01/28/2021   Lab Results  Component Value Date   TSH 2.830 09/28/2018   No results found for: CHOL, HDL, LDLCALC, LDLDIRECT, TRIG, CHOLHDL Lab Results  Component Value Date   VD25OH 54.8 01/28/2021   No results found for: WBC,  HGB, HCT, MCV, PLT No results found for: IRON, TIBC, FERRITIN  Obesity Behavioral Intervention:   Approximately 15 minutes were spent on the discussion below.  ASK: We discussed the diagnosis of obesity with Bethany Lynch today and Bethany Lynch agreed to give [place] permission to  discuss obesity behavioral modification therapy today.  ASSESS: Bethany Lynch has the diagnosis of obesity and her BMI today is 51.8. Bethany Lynch is in the action stage of change.   ADVISE: Bethany Lynch was educated on the multiple health risks of obesity as well as the benefit of weight loss to improve her health. She was advised of the need for long term treatment and the importance of lifestyle modifications to improve her current health and to decrease her risk of future health problems.  AGREE: Multiple dietary modification options and treatment options were discussed and Bethany Lynch agreed to follow the recommendations documented in the above note.  ARRANGE: Bethany Lynch was educated on the importance of frequent visits to treat obesity as outlined per CMS and USPSTF guidelines and agreed to schedule her next follow up appointment today.  Attestation Statements:   Reviewed by clinician on day of visit: allergies, medications, problem list, medical history, surgical history, family history, social history, and previous encounter notes.  I, Jackson Latino, RMA, am acting as Energy manager for [employer], DO.   I have reviewed the above documentation for accuracy and completeness, and I agree with the above. [name], DO

## 2021-08-06 ENCOUNTER — Ambulatory Visit (INDEPENDENT_AMBULATORY_CARE_PROVIDER_SITE_OTHER): Payer: Medicare Other

## 2021-08-06 ENCOUNTER — Encounter: Payer: Self-pay | Admitting: Cardiology

## 2021-08-06 ENCOUNTER — Ambulatory Visit (INDEPENDENT_AMBULATORY_CARE_PROVIDER_SITE_OTHER): Payer: Medicare Other | Admitting: Cardiology

## 2021-08-06 ENCOUNTER — Other Ambulatory Visit: Payer: Self-pay

## 2021-08-06 VITALS — BP 142/76 | HR 74 | Ht 64.0 in | Wt 307.2 lb

## 2021-08-06 DIAGNOSIS — R002 Palpitations: Secondary | ICD-10-CM | POA: Diagnosis not present

## 2021-08-06 DIAGNOSIS — R001 Bradycardia, unspecified: Secondary | ICD-10-CM

## 2021-08-06 DIAGNOSIS — R0602 Shortness of breath: Secondary | ICD-10-CM

## 2021-08-06 DIAGNOSIS — I350 Nonrheumatic aortic (valve) stenosis: Secondary | ICD-10-CM

## 2021-08-06 DIAGNOSIS — I10 Essential (primary) hypertension: Secondary | ICD-10-CM | POA: Diagnosis not present

## 2021-08-06 DIAGNOSIS — E782 Mixed hyperlipidemia: Secondary | ICD-10-CM

## 2021-08-06 NOTE — Patient Instructions (Addendum)
Medication Instructions:  Your physician has recommended you make the following change in your medication: Continue not taking the Verapamil until further notice  Lab Work: None If you have labs (blood work) drawn today and your tests are completely normal, you will receive your results only by: MyChart Message (if you have MyChart) OR A paper copy in the mail If you have any lab test that is abnormal or we need to change your treatment, we will call you to review the results.   Testing/Procedures: Christena Deem- Long Term Monitor Instructions  Your physician has requested you wear a ZIO patch monitor for 7 days starting on 08/25/2021.  This is a single patch monitor. [employer] supplies one patch monitor per enrollment. Additional stickers are not available. Please do not apply patch if you will be having a Nuclear Stress Test,  Echocardiogram, Cardiac CT, MRI, or Chest Xray during the period you would be wearing the  monitor. The patch cannot be worn during these tests. You cannot remove and re-apply the  ZIO XT patch monitor.  Your ZIO patch monitor will be mailed 3 day USPS to your address on file. It may take 3-5 days  to receive your monitor after you have been enrolled.  Once you have received your monitor, please review the enclosed instructions. Your monitor  has already been registered assigning a specific monitor serial # to you.      Follow-Up: At [facility], you and your health needs are our priority.  As part of our continuing mission to provide you with exceptional heart care, we have created designated Provider Care Teams.  These Care Teams include your primary Cardiologist (physician) and Advanced Practice Providers (APPs -  Physician Assistants and Nurse Practitioners) who all work together to provide you with the care you need, when you need it.  We recommend signing up for the patient portal called "MyChart".  Sign up information is provided on this After Visit Summary.   MyChart is used to connect with patients for Virtual Visits (Telemedicine).  Patients are able to view lab/test results, encounter notes, upcoming appointments, etc.  Non-urgent messages can be sent to your provider as well.   To learn more about what you can do with MyChart, go to [email or web address].    Your next appointment:   6 month(s)  The format for your next appointment:   In Person  Provider:   Gypsy Balsam, MD   Other Instructions

## 2021-08-06 NOTE — Progress Notes (Signed)
Cardiology Office Note:    Date:  08/06/2021   ID:  Gerre Ranum, DOB Apr 04, 1951, MRN [id number]  PCP:  Hal Morales, NP  Cardiologist:  Gypsy Balsam, MD    Referring MD: Hal Morales, NP   Chief Complaint  Patient presents with   Follow-up  Had colonoscopy done and the heart rate was very slow  History of Present Illness:    Bethany Lynch is a 70 y.o. female with very complex past medical history that include diabetes essential hypertension, morbid obesity, carotic artery stenosis, aortic stenosis.  Recently she had colonoscopy done in the hospital she was noted to be bradycardic.  Slowest recorded heart rate was 36, average heart rate was about 50.  She was completely asymptomatic.  She comes today to my office for follow-up she is very proud of herself because she lost about 30 pounds.  She is participating in weight management program she is very happy she is less and she does have results of this she still do some well water aerobic and doing quite well from that.  Past Medical History:  Diagnosis Date   Anxiety    Aortic stenosis 10/19/2019   Arthritis    Back pain    CFS (chronic fatigue syndrome)    Constipation    Diabetes (HCC)    Edema of both lower extremities    Essential hypertension 09/28/2018   GERD (gastroesophageal reflux disease)    Glaucoma    Hyperlipidemia 09/28/2018   IBS (irritable bowel syndrome)    Joint pain    Low energy    Lymphedema of lower extremity 01/02/2019   Palpitations    Shortness of breath 09/28/2018   SOB (shortness of breath)    Stenosis of left carotid artery 09/28/2018   Swallowing difficulty     Past Surgical History:  Procedure Laterality Date   GALLBLADDER SURGERY  07/27/1988   HERNIA REPAIR  04/04/2012   HERNIA REPAIR  09/09/2015   HYESTERECTOMY  02/06/1991    Current Medications: No outpatient medications have been marked as taking for the 08/06/21 encounter (Office Visit) with Georgeanna Lea, MD.      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  Tobacco Use   Smoking status: Never   Smokeless tobacco: Never  Substance and Sexual Activity   Alcohol use: Not on file   Drug use: Not on file   Sexual activity: Not on file  Other Topics Concern   Not on file  Social History Narrative   Not on file   Social Determinants of Health   Financial Resource Strain: Not on file  Food Insecurity: Not on file  Transportation Needs: Not on file  Physical Activity: Not on file  Stress: Not on file  Social Connections: Not on file     Family History: The patient's family history includes Colon cancer in her mother; Diabetes in her mother; Diabetes type II in her mother; Heart disease in her father; High Cholesterol in her father and mother; High blood pressure in her father and mother; Leukemia in her father; Memory loss in her mother; Obesity in her father and mother; Sleep apnea in her father; Stroke in her father.  ROS:   Please see the history of present illness.    All 14 point review of systems negative except as described per history of present illness  EKGs/Labs/Other Studies Reviewed:      Recent Labs: 01/21/2021: BUN 31; Creatinine, Ser 1.26; Potassium 5.2; Sodium 141  Recent Lipid Panel No results found for: CHOL, TRIG, HDL, CHOLHDL, VLDL, LDLCALC, LDLDIRECT  Physical Exam:    VS:  BP (!) 142/76 (BP Location: Left Arm, Patient Position: Sitting)   Pulse 74   Ht 5\' 4"  (1.626 m)   Wt (!) 307 lb 3.2 oz (139.3 kg)   SpO2 94%   BMI 52.73 kg/m     Wt Readings from Last 3 Encounters:  08/06/21 (!) 307 lb 3.2 oz (139.3 kg)  07/31/21 (!) 302 lb (137 kg)  07/10/21 300 lb (136.1 kg)     GEN:  Well nourished,  well developed in no acute distress HEENT: Normal NECK: No JVD; No carotid bruits LYMPHATICS: No lymphadenopathy CARDIAC: RRR, no murmurs, no rubs, no gallops RESPIRATORY:  Clear to auscultation without rales, wheezing or rhonchi  ABDOMEN: Soft, non-tender, non-distended MUSCULOSKELETAL:  No edema; No deformity  SKIN: Warm and dry LOWER EXTREMITIES: no swelling NEUROLOGIC:  Alert and oriented x 3 PSYCHIATRIC:  Normal affect   ASSESSMENT:    1. Nonrheumatic aortic valve stenosis   2. Essential hypertension   3. Palpitations   4. Shortness of breath   5. Mixed hyperlipidemia   6. Bradycardia    PLAN:    In order of problems listed above:  Sinus bradycardia during colonoscopy.  Nothing dramatic mechanism is sinus.  I will put Zio patch on her make sure there is no more slowing down her heart rate.  We will avoid AV blocking agent as well as sinus suppressing agent. Essential hypertension, blood pressure seems to be fine continue present management. Dyslipidemia she is still in the process of losing weight.  I will continue monitoring the situation and then recheck her cholesterol in the next few months. Diabetes since she lost more than 30 pounds her hemoglobin A1c is much better less than 7 now. She made very positive significant change in her lifestyle.  She is doing more exercise that she is dieting she lost some weight I congratulated her for it and encouraged her to keep doing it.   Medication Adjustments/Labs and Tests Ordered: Current medicines are reviewed at length with the patient today.  Concerns regarding medicines are outlined above.  No orders of the defined types were placed in this encounter.  Medication changes: No orders of the defined types were placed in this encounter.   Signed, 09/09/21, MD, [facility] 08/06/2021 4:22 PM    [facility]

## 2021-08-21 ENCOUNTER — Ambulatory Visit (INDEPENDENT_AMBULATORY_CARE_PROVIDER_SITE_OTHER): Payer: Medicare Other | Admitting: Family Medicine

## 2021-08-21 ENCOUNTER — Other Ambulatory Visit: Payer: Self-pay

## 2021-08-21 ENCOUNTER — Encounter (INDEPENDENT_AMBULATORY_CARE_PROVIDER_SITE_OTHER): Payer: Self-pay | Admitting: Family Medicine

## 2021-08-21 VITALS — BP 115/67 | HR 69 | Temp 97.9°F | Ht 64.0 in | Wt 298.0 lb

## 2021-08-21 DIAGNOSIS — I152 Hypertension secondary to endocrine disorders: Secondary | ICD-10-CM | POA: Diagnosis not present

## 2021-08-21 DIAGNOSIS — E1169 Type 2 diabetes mellitus with other specified complication: Secondary | ICD-10-CM

## 2021-08-21 DIAGNOSIS — E1159 Type 2 diabetes mellitus with other circulatory complications: Secondary | ICD-10-CM | POA: Diagnosis not present

## 2021-08-21 DIAGNOSIS — Z794 Long term (current) use of insulin: Secondary | ICD-10-CM | POA: Diagnosis not present

## 2021-08-21 DIAGNOSIS — Z6841 Body Mass Index (BMI) 40.0 and over, adult: Secondary | ICD-10-CM | POA: Diagnosis not present

## 2021-08-25 DIAGNOSIS — R0602 Shortness of breath: Secondary | ICD-10-CM | POA: Diagnosis not present

## 2021-08-25 DIAGNOSIS — R001 Bradycardia, unspecified: Secondary | ICD-10-CM | POA: Diagnosis not present

## 2021-08-25 DIAGNOSIS — R002 Palpitations: Secondary | ICD-10-CM | POA: Diagnosis not present

## 2021-08-25 NOTE — Progress Notes (Signed)
Chief Complaint:   OBESITY Bethany Lynch is here to discuss her progress with her obesity treatment plan along with follow-up of her obesity related diagnoses. Bethany Lynch is on the Category 2 Plan and states she is following her eating plan approximately 50% of the time. Bethany Lynch states she is doing water aerobics for 30-45 minutes 2 times per week.  Today's visit was #: 12 Starting weight: 339 lbs Starting date: 01/28/2021 Today's weight: 298 lbs Today's date:08/21/2021 Total lbs lost to date: 41 lbs Total lbs lost since last in-office visit: 4 lbs  Interim History: Bethany Lynch recently went on vacation to [facility] for 1 week. She made good choices and did the best she could. Her husband is very supportive. He is the cook at home. Her  hunger is satisfied.  Her daughter is also a patient.  Subjective:   1. Hypertension associated with type 2 diabetes mellitus (HCC) Bethany Lynch's blood pressure is much better today. It was elevated at last office visit. She will start wearing a heart monitor on Monday. She had some bradycardia recently (rate in 30s).  BP Readings from Last 3 Encounters:  08/21/21 115/67  08/06/21 (!) 142/76  07/31/21 (!) 149/70     2. Type 2 diabetes mellitus with other specified complication, with long-term current use of insulin (HCC) Bethany Lynch's CBGs run 89-116. Her diabetes mellitus is well controlled on Tresiba, Farxiga, Novolog and Metformin. Her last A1C was 6.3. DM managed by PCP. Her meal coverage has been reduced since she has lost weight to 10 units with meals  Lab Results  Component Value Date   HGBA1C 6.3 (H) 01/28/2021   Lab Results  Component Value Date   CREATININE 1.26 (H) 01/21/2021   Lab Results  Component Value Date   INSULIN 3.7 01/28/2021    Assessment/Plan:   1. Hypertension associated with type 2 diabetes mellitus (HCC) Bethany Lynch will continue all medications and she will follow up with cardiology.   2. Type 2 diabetes mellitus with other  specified complication, with long-term current use of insulin (HCC) Bethany Lynch will follow up with PCP for management.   3. Obesity, current BMI 51.13 Bethany Lynch is currently in the action stage of change. As such, her goal is to continue with weight loss efforts. She has agreed to the Category 2 Plan and keeping a food journal and adhering to recommended goals of 400-500 calories and 35 grams of protein at supper.   Exercise goals:  As is.  Behavioral modification strategies: meal planning and cooking strategies.  Bethany Lynch has agreed to follow-up with our clinic in 3 weeks.  Objective:   Blood pressure 115/67, pulse 69, temperature 97.9 F (36.6 C), height 5\' 4"  (1.626 m), weight 298 lb (135.2 kg), SpO2 99 %. Body mass index is 51.15 kg/m.  General: Cooperative, alert, well developed, in no acute distress. HEENT: Conjunctivae and lids unremarkable. Cardiovascular: Regular rhythm.  Lungs: Normal work of breathing. Neurologic: No focal deficits.   Lab Results  Component Value Date   CREATININE 1.26 (H) 01/21/2021   BUN 31 (H) 01/21/2021   NA 141 01/21/2021   K 5.2 01/21/2021   CL 101 01/21/2021   CO2 20 01/21/2021   No results found for: ALT, AST, GGT, ALKPHOS, BILITOT Lab Results  Component Value Date   HGBA1C 6.3 (H) 01/28/2021   Lab Results  Component Value Date   INSULIN 3.7 01/28/2021   Lab Results  Component Value Date   TSH 2.830 09/28/2018   No results found for:  CHOL, HDL, LDLCALC, LDLDIRECT, TRIG, CHOLHDL Lab Results  Component Value Date   VD25OH 54.8 01/28/2021   No results found for: WBC, HGB, HCT, MCV, PLT No results found for: IRON, TIBC, FERRITIN  Obesity Behavioral Intervention:   Approximately 15 minutes were spent on the discussion below.  ASK: We discussed the diagnosis of obesity with Bethany Lynch today and Bethany Lynch agreed to give [place] permission to discuss obesity behavioral modification therapy today.  ASSESS: Bethany Lynch has the diagnosis of obesity and her  BMI today is 51.2. Bethany Lynch is in the action stage of change.   ADVISE: Bethany Lynch was educated on the multiple health risks of obesity as well as the benefit of weight loss to improve her health. She was advised of the need for long term treatment and the importance of lifestyle modifications to improve her current health and to decrease her risk of future health problems.  AGREE: Multiple dietary modification options and treatment options were discussed and Bethany Lynch agreed to follow the recommendations documented in the above note.  ARRANGE: Bethany Lynch was educated on the importance of frequent visits to treat obesity as outlined per CMS and USPSTF guidelines and agreed to schedule her next follow up appointment today.  Attestation Statements:   Reviewed by clinician on day of visit: allergies, medications, problem list, medical history, surgical history, family history, social history, and previous encounter notes.  I, Jackson Latino, RMA, am acting as Energy manager for [place], FNP.   I have reviewed the above documentation for accuracy and completeness, and I agree with the above. -  Jesse Sans, FNP

## 2021-09-11 ENCOUNTER — Ambulatory Visit (INDEPENDENT_AMBULATORY_CARE_PROVIDER_SITE_OTHER): Payer: Medicare Other | Admitting: Bariatrics

## 2021-09-11 ENCOUNTER — Encounter (INDEPENDENT_AMBULATORY_CARE_PROVIDER_SITE_OTHER): Payer: Self-pay | Admitting: Bariatrics

## 2021-09-11 ENCOUNTER — Other Ambulatory Visit: Payer: Self-pay

## 2021-09-11 VITALS — BP 154/75 | HR 60 | Temp 97.5°F | Ht 64.0 in | Wt 302.0 lb

## 2021-09-11 DIAGNOSIS — Z6841 Body Mass Index (BMI) 40.0 and over, adult: Secondary | ICD-10-CM | POA: Diagnosis not present

## 2021-09-11 DIAGNOSIS — E785 Hyperlipidemia, unspecified: Secondary | ICD-10-CM | POA: Diagnosis not present

## 2021-09-11 DIAGNOSIS — E1169 Type 2 diabetes mellitus with other specified complication: Secondary | ICD-10-CM | POA: Diagnosis not present

## 2021-09-11 DIAGNOSIS — Z794 Long term (current) use of insulin: Secondary | ICD-10-CM | POA: Diagnosis not present

## 2021-09-11 NOTE — Progress Notes (Signed)
Chief Complaint:   OBESITY Bethany Lynch is here to discuss her progress with her obesity treatment plan along with follow-up of her obesity related diagnoses. Bethany Lynch is on the Category 2 Plan and states she is following her eating plan approximately 50% of the time. Bethany Lynch states she is walking for 30 minutes 2 times per week and water aerobics for 45 minutes 2 times per week.  Today's visit was #: 13 Starting weight: 339 lbs Starting date: 01/28/2021 Today's weight: 302 lbs Today's date: 09/11/2021 Total lbs lost to date: 37 lbs Total lbs lost since last in-office visit: 0  Interim History: Bethany Lynch is up 4 lbs. She has been less active due to wearing a heart monitor.  Subjective:   1. Hyperlipidemia associated with type 2 diabetes mellitus (HCC) Bethany Lynch is taking Crestor currently.  2. Type 2 diabetes mellitus with other specified complication, with long-term current use of insulin (HCC) Bethany Lynch is taking Novolog, Metformin, Farxiga, and Tresiba. Her last A1C was 6.3 which is down 10. Her fasting blood sugar ranges between 90-130's.  Assessment/Plan:   1. Hyperlipidemia associated with type 2 diabetes mellitus (HCC) Cardiovascular risk and specific lipid/LDL goals reviewed.  We discussed several lifestyle modifications today and Bethany Lynch will continue to work on diet, exercise and weight loss efforts. Bethany Lynch will continue medications. Orders and follow up as documented in patient record.   Counseling Intensive lifestyle modifications are the first line treatment for this issue. Dietary changes: Increase soluble fiber. Decrease simple carbohydrates. Exercise changes: Moderate to vigorous-intensity aerobic activity 150 minutes per week if tolerated. Lipid-lowering medications: see documented in medical record.   2. Type 2 diabetes mellitus with other specified complication, with long-term current use of insulin (HCC) Bethany Lynch will continue medications.She will discuss her insulin with  primary care provider ( I would like her to gradually decrease her dose over time ).  Good blood sugar control is important to decrease the likelihood of diabetic complications such as nephropathy, neuropathy, limb loss, blindness, coronary artery disease, and death. Intensive lifestyle modification including diet, exercise and weight loss are the first line of treatment for diabetes.   3. Obesity, current BMI 51.9 Bethany Lynch is currently in the action stage of change. As such, her goal is to continue with weight loss efforts. She has agreed to the Category 2 Plan.   Bethany Lynch will continue meal planning. He will adhere closely to the plan at 80-90%.  Exercise goals:  As is.  Behavioral modification strategies: increasing lean protein intake, decreasing simple carbohydrates, increasing vegetables, increasing water intake, decreasing eating out, no skipping meals, meal planning and cooking strategies, keeping healthy foods in the home, and planning for success.  Bethany Lynch has agreed to follow-up with our clinic in 3 weeks (fasting). She was informed of the importance of frequent follow-up visits to maximize her success with intensive lifestyle modifications for her multiple health conditions.   Objective:   Blood pressure (!) 154/75, pulse 60, temperature (!) 97.5 F (36.4 C), height 5\' 4"  (1.626 m), weight (!) 302 lb (137 kg), SpO2 98 %. Body mass index is 51.84 kg/m.  General: Cooperative, alert, well developed, in no acute distress. HEENT: Conjunctivae and lids unremarkable. Cardiovascular: Regular rhythm.  Lungs: Normal work of breathing. Neurologic: No focal deficits.   Lab Results  Component Value Date   CREATININE 1.26 (H) 01/21/2021   BUN 31 (H) 01/21/2021   NA 141 01/21/2021   K 5.2 01/21/2021   CL 101 01/21/2021   CO2 20 01/21/2021  No results found for: ALT, AST, GGT, ALKPHOS, BILITOT Lab Results  Component Value Date   HGBA1C 6.3 (H) 01/28/2021   Lab Results  Component  Value Date   INSULIN 3.7 01/28/2021   Lab Results  Component Value Date   TSH 2.830 09/28/2018   No results found for: CHOL, HDL, LDLCALC, LDLDIRECT, TRIG, CHOLHDL Lab Results  Component Value Date   VD25OH 54.8 01/28/2021   No results found for: WBC, HGB, HCT, MCV, PLT No results found for: IRON, TIBC, FERRITIN  Obesity Behavioral Intervention:   Approximately 15 minutes were spent on the discussion below.  ASK: We discussed the diagnosis of obesity with Bethany Lynch today and Bethany Lynch agreed to give [place] permission to discuss obesity behavioral modification therapy today.  ASSESS: Bethany Lynch has the diagnosis of obesity and her BMI today is 51.9. Bethany Lynch is in the action stage of change.   ADVISE: Bethany Lynch was educated on the multiple health risks of obesity as well as the benefit of weight loss to improve her health. She was advised of the need for long term treatment and the importance of lifestyle modifications to improve her current health and to decrease her risk of future health problems.  AGREE: Multiple dietary modification options and treatment options were discussed and Bethany Lynch agreed to follow the recommendations documented in the above note.  ARRANGE: Bethany Lynch was educated on the importance of frequent visits to treat obesity as outlined per CMS and USPSTF guidelines and agreed to schedule her next follow up appointment today.  Attestation Statements:   Reviewed by clinician on day of visit: allergies, medications, problem list, medical history, surgical history, family history, social history, and previous encounter notes.  I, Jackson Latino, RMA, am acting as Energy manager for [employer], DO.   I have reviewed the above documentation for accuracy and completeness, and I agree with the above. [name], DO

## 2021-09-16 ENCOUNTER — Encounter (INDEPENDENT_AMBULATORY_CARE_PROVIDER_SITE_OTHER): Payer: Self-pay | Admitting: Bariatrics

## 2021-09-23 ENCOUNTER — Telehealth: Payer: Self-pay | Admitting: Cardiology

## 2021-09-23 ENCOUNTER — Telehealth: Payer: Self-pay

## 2021-09-23 DIAGNOSIS — R002 Palpitations: Secondary | ICD-10-CM

## 2021-09-23 DIAGNOSIS — R0602 Shortness of breath: Secondary | ICD-10-CM

## 2021-09-23 DIAGNOSIS — I472 Ventricular tachycardia, unspecified: Secondary | ICD-10-CM

## 2021-09-23 NOTE — Telephone Encounter (Signed)
Patient notified of results and recommendations. Agreed to plan. Echo order placed, she is aware we will contact her for an appt.

## 2021-09-23 NOTE — Telephone Encounter (Signed)
-----   Message from Bethany Lea, MD sent at 09/22/2021  9:51 AM EDT ----- Monitor showed ventricular tachycardia.  Short episode.  Lets schedule her to have echocardiogram to assess left ventricle ejection fraction

## 2021-09-23 NOTE — Telephone Encounter (Signed)
Patient is returning call to discuss monitor results. 

## 2021-10-06 ENCOUNTER — Ambulatory Visit (INDEPENDENT_AMBULATORY_CARE_PROVIDER_SITE_OTHER): Payer: Medicare Other

## 2021-10-06 ENCOUNTER — Other Ambulatory Visit: Payer: Self-pay

## 2021-10-06 DIAGNOSIS — R0602 Shortness of breath: Secondary | ICD-10-CM

## 2021-10-06 DIAGNOSIS — R002 Palpitations: Secondary | ICD-10-CM | POA: Diagnosis not present

## 2021-10-06 DIAGNOSIS — I472 Ventricular tachycardia, unspecified: Secondary | ICD-10-CM | POA: Diagnosis not present

## 2021-10-06 LAB — ECHOCARDIOGRAM COMPLETE
AR max vel: 1.58 cm2
AV Area VTI: 1.6 cm2
AV Area mean vel: 1.48 cm2
AV Mean grad: 12 mmHg
AV Peak grad: 20.6 mmHg
Ao pk vel: 2.27 m/s
Area-P 1/2: 3.93 cm2
S' Lateral: 2.8 cm

## 2021-10-09 ENCOUNTER — Other Ambulatory Visit: Payer: Self-pay

## 2021-10-09 ENCOUNTER — Ambulatory Visit (INDEPENDENT_AMBULATORY_CARE_PROVIDER_SITE_OTHER): Payer: Medicare Other | Admitting: Bariatrics

## 2021-10-09 ENCOUNTER — Encounter (INDEPENDENT_AMBULATORY_CARE_PROVIDER_SITE_OTHER): Payer: Self-pay | Admitting: Bariatrics

## 2021-10-09 VITALS — BP 144/73 | HR 49 | Temp 97.4°F | Ht 64.0 in | Wt 298.0 lb

## 2021-10-09 DIAGNOSIS — Z6841 Body Mass Index (BMI) 40.0 and over, adult: Secondary | ICD-10-CM

## 2021-10-09 DIAGNOSIS — E559 Vitamin D deficiency, unspecified: Secondary | ICD-10-CM

## 2021-10-09 DIAGNOSIS — E1169 Type 2 diabetes mellitus with other specified complication: Secondary | ICD-10-CM | POA: Diagnosis not present

## 2021-10-09 DIAGNOSIS — E669 Obesity, unspecified: Secondary | ICD-10-CM

## 2021-10-09 DIAGNOSIS — I10 Essential (primary) hypertension: Secondary | ICD-10-CM | POA: Diagnosis not present

## 2021-10-09 NOTE — Progress Notes (Signed)
Chief Complaint:   OBESITY Bethany Lynch is here to discuss her progress with her obesity treatment plan along with follow-up of her obesity related diagnoses. Bethany Lynch is on the Category 2 Plan and states she is following her eating plan approximately 75-80% of the time. Bethany Lynch states she is doing water aerobics and walking for 75 minutes 1 times per week.  Today's visit was #: 14 Starting weight: 339 lbs Starting date: 01/28/2021 Today's weight: 298 lbs Today's date: 10/09/2021 Total lbs lost to date: 41 lbs Total lbs lost since last in-office visit: 4 lbs  Interim History: Bethany Lynch is down an additional 4 lbs.   Subjective:   1. Essential hypertension Bethany Lynch's blood pressure is reasonably well controlled.  2. Diabetes mellitus type 2 in obese Bethany Lynch) Bethany Lynch is currently taking [place], [place].   3. Vitamin D deficiency Bethany Lynch is taking Vitamin D currently.   Assessment/Plan:   1. Essential hypertension Tani will continue her medications. We will check CMP and Lipids today. She is working on healthy weight loss and exercise to improve blood pressure control. We will watch for signs of hypotension as she continues her lifestyle modifications.  - Comprehensive metabolic panel  2. Diabetes mellitus type 2 in obese Bethany Lynch) Bethany Lynch will continue her medications. We will check A1C today. Good blood sugar control is important to decrease the likelihood of diabetic complications such as nephropathy, neuropathy, limb loss, blindness, coronary artery disease, and death. Intensive lifestyle modification including diet, exercise and weight loss are the first line of treatment for diabetes.   - Comprehensive metabolic panel - Hemoglobin A1c - Microalbumin / creatinine urine ratio - Lipid Panel With LDL/HDL Ratio  3. Vitamin D deficiency Low Vitamin D level contributes to fatigue and are associated with obesity, breast, and colon cancer. We will check Vitamin D today and Bethany Lynch  will follow-up for routine testing of Vitamin D, at least 2-3 times per year to avoid over-replacement.  - VITAMIN D 25 Hydroxy (Vit-D Deficiency, Fractures)  4. Obesity, current BMI 51.2 Bethany Lynch is currently in the action stage of change. As such, her goal is to continue with weight loss efforts. She has agreed to the Category 2 Plan.   Bethany Lynch will continue meal planning and intentional eating. Strategies for the holidays were provided.   Exercise goals:  As is.  Behavioral modification strategies: increasing lean protein intake, decreasing simple carbohydrates, increasing vegetables, increasing water intake, decreasing eating out, no skipping meals, meal planning and cooking strategies, keeping healthy foods in the home, and planning for success.  Bethany Lynch has agreed to follow-up with our clinic in 3 weeks with myself or Bethany Salvage, FNP or Bethany Hamburger, NP. 6 weeks with myself. She was informed of the importance of frequent follow-up visits to maximize her success with intensive lifestyle modifications for her multiple health conditions.   Bethany Lynch was informed we would discuss her lab results at her next visit unless there is a critical issue that needs to be addressed sooner. Bethany Lynch agreed to keep her next visit at the agreed upon time to discuss these results.  Objective:   Blood pressure (!) 144/73, pulse (!) 49, temperature (!) 97.4 F (36.3 C), height 5\' 4"  (1.626 m), weight 298 lb (135.2 kg), SpO2 99 %. Body mass index is 51.15 kg/m.  General: Cooperative, alert, well developed, in no acute distress. HEENT: Conjunctivae and lids unremarkable. Cardiovascular: Regular rhythm.  Lungs: Normal work of breathing. Neurologic: No focal deficits.   Lab Results  Component  Value Date   CREATININE 1.26 (H) 01/21/2021   BUN 31 (H) 01/21/2021   NA 141 01/21/2021   K 5.2 01/21/2021   CL 101 01/21/2021   CO2 20 01/21/2021   No results found for: ALT, AST, GGT, ALKPHOS, BILITOT Lab  Results  Component Value Date   HGBA1C 6.3 (H) 01/28/2021   Lab Results  Component Value Date   INSULIN 3.7 01/28/2021   Lab Results  Component Value Date   TSH 2.830 09/28/2018   No results found for: CHOL, HDL, LDLCALC, LDLDIRECT, TRIG, CHOLHDL Lab Results  Component Value Date   VD25OH 54.8 01/28/2021   No results found for: WBC, HGB, HCT, MCV, PLT No results found for: IRON, TIBC, FERRITIN  Obesity Behavioral Intervention:   Approximately 15 minutes were spent on the discussion below.  ASK: We discussed the diagnosis of obesity with Chestina today and Shye agreed to give [place] permission to discuss obesity behavioral modification therapy today.  ASSESS: Preet has the diagnosis of obesity and her BMI today is 51.2. Izzah is in the action stage of change.   ADVISE: Paeton was educated on the multiple health risks of obesity as well as the benefit of weight loss to improve her health. She was advised of the need for long term treatment and the importance of lifestyle modifications to improve her current health and to decrease her risk of future health problems.  AGREE: Multiple dietary modification options and treatment options were discussed and Aricela agreed to follow the recommendations documented in the above note.  ARRANGE: Thalia was educated on the importance of frequent visits to treat obesity as outlined per CMS and USPSTF guidelines and agreed to schedule her next follow up appointment today.  Attestation Statements:   Reviewed by clinician on day of visit: allergies, medications, problem list, medical history, surgical history, family history, social history, and previous encounter notes.  I, Jackson Latino, RMA, am acting as Energy manager for [employer], DO.   I have reviewed the above documentation for accuracy and completeness, and I agree with the above. Bethany Capra, DO

## 2021-10-10 ENCOUNTER — Encounter (INDEPENDENT_AMBULATORY_CARE_PROVIDER_SITE_OTHER): Payer: Self-pay | Admitting: Bariatrics

## 2021-10-10 LAB — MICROALBUMIN / CREATININE URINE RATIO
Creatinine, Urine: 44 mg/dL
Microalb/Creat Ratio: 26 mg/g creat (ref 0–29)
Microalbumin, Urine: 11.3 ug/mL

## 2021-10-10 LAB — COMPREHENSIVE METABOLIC PANEL
ALT: 23 IU/L (ref 0–32)
AST: 18 IU/L (ref 0–40)
Albumin/Globulin Ratio: 1.9 (ref 1.2–2.2)
Albumin: 4.5 g/dL (ref 3.8–4.8)
Alkaline Phosphatase: 93 IU/L (ref 44–121)
BUN/Creatinine Ratio: 39 — ABNORMAL HIGH (ref 12–28)
BUN: 35 mg/dL — ABNORMAL HIGH (ref 8–27)
Bilirubin Total: 0.4 mg/dL (ref 0.0–1.2)
CO2: 24 mmol/L (ref 20–29)
Calcium: 10 mg/dL (ref 8.7–10.3)
Chloride: 101 mmol/L (ref 96–106)
Creatinine, Ser: 0.9 mg/dL (ref 0.57–1.00)
Globulin, Total: 2.4 g/dL (ref 1.5–4.5)
Glucose: 83 mg/dL (ref 70–99)
Potassium: 4.6 mmol/L (ref 3.5–5.2)
Sodium: 140 mmol/L (ref 134–144)
Total Protein: 6.9 g/dL (ref 6.0–8.5)
eGFR: 70 mL/min/{1.73_m2} (ref >59)

## 2021-10-10 LAB — LIPID PANEL WITH LDL/HDL RATIO
Cholesterol, Total: 150 mg/dL (ref 100–199)
HDL: 50 mg/dL (ref >39)
LDL Chol Calc (NIH): 79 mg/dL (ref 0–99)
LDL/HDL Ratio: 1.6 ratio (ref 0.0–3.2)
Triglycerides: 120 mg/dL (ref 0–149)
VLDL Cholesterol Cal: 21 mg/dL (ref 5–40)

## 2021-10-10 LAB — HEMOGLOBIN A1C
Est. average glucose Bld gHb Est-mCnc: 134 mg/dL
Hgb A1c MFr Bld: 6.3 % — ABNORMAL HIGH (ref 4.8–5.6)

## 2021-10-10 LAB — VITAMIN D 25 HYDROXY (VIT D DEFICIENCY, FRACTURES): Vit D, 25-Hydroxy: 60.3 ng/mL (ref 30.0–100.0)

## 2021-10-13 ENCOUNTER — Other Ambulatory Visit: Payer: Self-pay | Admitting: Cardiology

## 2021-11-03 ENCOUNTER — Encounter (INDEPENDENT_AMBULATORY_CARE_PROVIDER_SITE_OTHER): Payer: Self-pay

## 2021-11-04 ENCOUNTER — Encounter (INDEPENDENT_AMBULATORY_CARE_PROVIDER_SITE_OTHER): Payer: Self-pay | Admitting: Adult Health

## 2021-11-04 ENCOUNTER — Telehealth (INDEPENDENT_AMBULATORY_CARE_PROVIDER_SITE_OTHER): Payer: Medicare Other | Admitting: Adult Health

## 2021-11-04 ENCOUNTER — Other Ambulatory Visit: Payer: Self-pay

## 2021-11-04 DIAGNOSIS — E1169 Type 2 diabetes mellitus with other specified complication: Secondary | ICD-10-CM | POA: Diagnosis not present

## 2021-11-04 DIAGNOSIS — Z6841 Body Mass Index (BMI) 40.0 and over, adult: Secondary | ICD-10-CM | POA: Diagnosis not present

## 2021-11-04 DIAGNOSIS — Z794 Long term (current) use of insulin: Secondary | ICD-10-CM | POA: Diagnosis not present

## 2021-11-04 DIAGNOSIS — I6522 Occlusion and stenosis of left carotid artery: Secondary | ICD-10-CM

## 2021-11-05 NOTE — Progress Notes (Signed)
TeleHealth Visit:  Due to the COVID-19 pandemic, this visit was completed with telemedicine (audio/video) technology to reduce patient and provider exposure as well as to preserve personal protective equipment.   Bethany Lynch has verbally consented to this TeleHealth visit. The patient is located at home, the provider is located at the [facility] office. The participants in this visit include the listed provider and patient. The visit was conducted today via MyChart video.  Chief Complaint: OBESITY Bethany Lynch is here to discuss her progress with her obesity treatment plan along with follow-up of her obesity related diagnoses. Bethany Lynch is on the Category 2 Plan and states she is following her eating plan approximately 40% of the time. Bethany Lynch states she is doing water aerobics for 45 minutes 2 times per week and walking for 30 minutes 2 times per week.  Today's visit was #: 15 Starting weight: 339 lbs Starting date: 01/28/2021  Interim History:  Bethany Lynch's daughter tested positive for COVIDon 11/02/21 - her daughter is experiencing nasal congestion, fever. She was exposed to her daughter on Saturday, 11/01/2021. She denies any acute symptoms and has not tested for COVID.  Non-scale win - recent mountain trip with friends - no need to use walker to ambulate.   She wants to be able participate in more activities with the group!  Subjective:   1. Type 2 diabetes mellitus with other specified complication, with long-term current use of insulin (HCC) Fasting BG 90-103. Currently on Farxiga 10 mg daily, Tresiba 80 units daily, Novolog 11 units TID, metformin 500 mg BID.  Assessment/Plan:   1. Type 2 diabetes mellitus with other specified complication, with long-term current use of insulin (HCC) Continue antidiabetic regimen as directed by PCP, followed every 3 months.  2. Obesity, current BMI 51.2  Bethany Lynch is currently in the action stage of change. As such, her goal is to continue with  weight loss efforts. She has agreed to the Category 2 Plan.   Exercise goals:  As is.  Behavioral modification strategies: increasing lean protein intake, decreasing simple carbohydrates, meal planning and cooking strategies, keeping healthy foods in the home, and planning for success.  Bethany Lynch has agreed to follow-up with our clinic in 2 weeks. She was informed of the importance of frequent follow-up visits to maximize her success with intensive lifestyle modifications for her multiple health conditions.  Objective:   VITALS: Per patient if applicable, see vitals. GENERAL: Alert and in no acute distress. CARDIOPULMONARY: No increased WOB. Speaking in clear sentences.  PSYCH: Pleasant and cooperative. Speech normal rate and rhythm. Affect is appropriate. Insight and judgement are appropriate. Attention is focused, linear, and appropriate.  NEURO: Oriented as arrived to appointment on time with no prompting.   Lab Results  Component Value Date   CREATININE 0.90 10/09/2021   BUN 35 (H) 10/09/2021   NA 140 10/09/2021   K 4.6 10/09/2021   CL 101 10/09/2021   CO2 24 10/09/2021   Lab Results  Component Value Date   ALT 23 10/09/2021   AST 18 10/09/2021   ALKPHOS 93 10/09/2021   BILITOT 0.4 10/09/2021   Lab Results  Component Value Date   HGBA1C 6.3 (H) 10/09/2021   HGBA1C 6.3 (H) 01/28/2021   Lab Results  Component Value Date   INSULIN 3.7 01/28/2021   Lab Results  Component Value Date   TSH 2.830 09/28/2018   Lab Results  Component Value Date   CHOL 150 10/09/2021   HDL 50 10/09/2021   LDLCALC  79 10/09/2021   TRIG 120 10/09/2021   Lab Results  Component Value Date   VD25OH 60.3 10/09/2021   VD25OH 54.8 01/28/2021   Attestation Statements:   Reviewed by clinician on day of visit: allergies, medications, problem list, medical history, surgical history, family history, social history, and previous encounter notes.  Time spent on visit including pre-visit chart  review and post-visit charting and care was 25 minutes.   I, Insurance claims handler, CMA, am acting as Energy manager for William Hamburger, NP.  I have reviewed the above documentation for accuracy and completeness, and I agree with the above. -Bethany Lynch d. Lisa Milian, NP-C

## 2021-11-20 ENCOUNTER — Other Ambulatory Visit: Payer: Self-pay

## 2021-11-20 ENCOUNTER — Ambulatory Visit (INDEPENDENT_AMBULATORY_CARE_PROVIDER_SITE_OTHER): Payer: Medicare Other | Admitting: Bariatrics

## 2021-11-20 ENCOUNTER — Encounter (INDEPENDENT_AMBULATORY_CARE_PROVIDER_SITE_OTHER): Payer: Self-pay | Admitting: Bariatrics

## 2021-11-20 VITALS — BP 140/70 | HR 55 | Temp 97.4°F | Ht 64.0 in | Wt 299.0 lb

## 2021-11-20 DIAGNOSIS — Z794 Long term (current) use of insulin: Secondary | ICD-10-CM

## 2021-11-20 DIAGNOSIS — E1169 Type 2 diabetes mellitus with other specified complication: Secondary | ICD-10-CM

## 2021-11-20 DIAGNOSIS — Z6841 Body Mass Index (BMI) 40.0 and over, adult: Secondary | ICD-10-CM

## 2021-11-20 DIAGNOSIS — I10 Essential (primary) hypertension: Secondary | ICD-10-CM

## 2021-11-25 ENCOUNTER — Encounter (INDEPENDENT_AMBULATORY_CARE_PROVIDER_SITE_OTHER): Payer: Self-pay | Admitting: Bariatrics

## 2021-11-25 NOTE — Progress Notes (Signed)
Chief Complaint:   OBESITY Bethany Lynch is here to discuss her progress with her obesity treatment plan along with follow-up of her obesity related diagnoses. Bethany Lynch is on the Category 2 Plan and states she is following her eating plan approximately 20% of the time. Bethany Lynch states she is doing water aerobics for 45 minutes 1 time per week.  Today's visit was #: 16 Starting weight: 339 lbs Starting date: 01/28/2021 Today's weight: 299 lbs Today's date: 11/20/2021 Total lbs lost to date: 40 Total lbs lost since last in-office visit: 0  Interim History: Bethany Lynch is up 1 lb since her last office visit.  Subjective:   1. Type 2 diabetes mellitus with other specified complication, with long-term current use of insulin (HCC) Bethany Lynch is taking [place], Novolog, metformin, and [place]. Her last A1c was 6.0.  2. Essential hypertension Bethany Lynch's blood pressure is stable today.  Assessment/Plan:   1. Type 2 diabetes mellitus with other specified complication, with long-term current use of insulin (HCC) Bethany Lynch will continue her medications. Good blood sugar control is important to decrease the likelihood of diabetic complications such as nephropathy, neuropathy, limb loss, blindness, coronary artery disease, and death. Intensive lifestyle modification including diet, exercise and weight loss are the first line of treatment for diabetes.   2. Essential hypertension Bethany Lynch will continue her medications, and will continue working on healthy weight loss and exercise to improve blood pressure control. We will watch for signs of hypotension as she continues her lifestyle modifications.  3. Obesity, current BMI 51.4 Bethany Lynch is currently in the action stage of change. As such, her goal is to continue with weight loss efforts. She has agreed to the Category 2 Plan.   Meal planning and intentional eating were discussed. We will reviewed labs today (CMP, Lipids, and A1c).  Exercise goals: As is, and increase.  She will consider chair exercise.  Behavioral modification strategies: increasing lean protein intake, decreasing simple carbohydrates, increasing vegetables, increasing water intake, decreasing eating out, no skipping meals, meal planning and cooking strategies, keeping healthy foods in the home, and planning for success.  Bethany Lynch has agreed to follow-up with our clinic in 4 weeks. She was informed of the importance of frequent follow-up visits to maximize her success with intensive lifestyle modifications for her multiple health conditions.   Objective:   Blood pressure 140/70, pulse (!) 55, temperature (!) 97.4 F (36.3 C), height 5\' 4"  (1.626 m), weight 299 lb (135.6 kg), SpO2 98 %. Body mass index is 51.32 kg/m.  General: Cooperative, alert, well developed, in no acute distress. HEENT: Conjunctivae and lids unremarkable. Cardiovascular: Regular rhythm.  Lungs: Normal work of breathing. Neurologic: No focal deficits.   Lab Results  Component Value Date   CREATININE 0.90 10/09/2021   BUN 35 (H) 10/09/2021   NA 140 10/09/2021   K 4.6 10/09/2021   CL 101 10/09/2021   CO2 24 10/09/2021   Lab Results  Component Value Date   ALT 23 10/09/2021   AST 18 10/09/2021   ALKPHOS 93 10/09/2021   BILITOT 0.4 10/09/2021   Lab Results  Component Value Date   HGBA1C 6.3 (H) 10/09/2021   HGBA1C 6.3 (H) 01/28/2021   Lab Results  Component Value Date   INSULIN 3.7 01/28/2021   Lab Results  Component Value Date   TSH 2.830 09/28/2018   Lab Results  Component Value Date   CHOL 150 10/09/2021   HDL 50 10/09/2021   LDLCALC 79 10/09/2021   TRIG 120 10/09/2021  Lab Results  Component Value Date   VD25OH 60.3 10/09/2021   VD25OH 54.8 01/28/2021   No results found for: WBC, HGB, HCT, MCV, PLT No results found for: IRON, TIBC, FERRITIN  Attestation Statements:   Reviewed by clinician on day of visit: allergies, medications, problem list, medical history, surgical history,  family history, social history, and previous encounter notes.   Trude Mcburney, am acting as Energy manager for [employer], DO.  I have reviewed the above documentation for accuracy and completeness, and I agree with the above. [name], DO

## 2021-12-25 ENCOUNTER — Encounter (INDEPENDENT_AMBULATORY_CARE_PROVIDER_SITE_OTHER): Payer: Self-pay | Admitting: Bariatrics

## 2021-12-25 ENCOUNTER — Other Ambulatory Visit: Payer: Self-pay

## 2021-12-25 ENCOUNTER — Ambulatory Visit (INDEPENDENT_AMBULATORY_CARE_PROVIDER_SITE_OTHER): Payer: Medicare Other | Admitting: Bariatrics

## 2021-12-25 VITALS — BP 146/76 | HR 61 | Temp 97.6°F | Ht 64.0 in | Wt 296.0 lb

## 2021-12-25 DIAGNOSIS — E1159 Type 2 diabetes mellitus with other circulatory complications: Secondary | ICD-10-CM

## 2021-12-25 DIAGNOSIS — E785 Hyperlipidemia, unspecified: Secondary | ICD-10-CM | POA: Diagnosis not present

## 2021-12-25 DIAGNOSIS — I10 Essential (primary) hypertension: Secondary | ICD-10-CM

## 2021-12-25 DIAGNOSIS — Z6841 Body Mass Index (BMI) 40.0 and over, adult: Secondary | ICD-10-CM

## 2021-12-25 DIAGNOSIS — E669 Obesity, unspecified: Secondary | ICD-10-CM | POA: Diagnosis not present

## 2021-12-25 DIAGNOSIS — E1169 Type 2 diabetes mellitus with other specified complication: Secondary | ICD-10-CM

## 2021-12-25 NOTE — Progress Notes (Signed)
Chief Complaint:   OBESITY Bethany Lynch is here to discuss her progress with her obesity treatment plan along with follow-up of her obesity related diagnoses. Bethany Lynch is on the Category 2 Plan and states she is following her eating plan approximately 40-50% of the time. Desteney states she is doing 0 minutes 0 times per week.  Today's visit was #: 19 Starting weight: 339 lbs Starting date: 01/28/2021 Today's weight: 296 lbs Today's date: 12/25/2021 Total lbs lost to date: 43 lbs Total lbs lost since last in-office visit: 3 lbs  Interim History: Hee is down 3 lbs and doing well. She has had since celebrations.   Subjective:   1. Essential hypertension Bethany Lynch is currently taking Aldactone and Micardis.  2. Hyperlipidemia associated with type 2 diabetes mellitus (Bethany Lynch) Bethany Lynch is taking Crestor currently.  Assessment/Plan:   1. Essential hypertension Bethany Lynch will continue her medications. She is working on healthy weight loss and exercise to improve blood pressure control. We will watch for signs of hypotension as she continues her lifestyle modifications.  2. Hyperlipidemia associated with type 2 diabetes mellitus ([place]) Cardiovascular risk and specific lipid/LDL goals reviewed. Bethany Lynch will continue taking Crestor.  We discussed several lifestyle modifications today and Bethany Lynch will continue to work on diet, exercise and weight loss efforts. Orders and follow up as documented in patient record.   Counseling Intensive lifestyle modifications are the first line treatment for this issue. Dietary changes: Increase soluble fiber. Decrease simple carbohydrates. Exercise changes: Moderate to vigorous-intensity aerobic activity 150 minutes per week if tolerated. Lipid-lowering medications: see documented in medical record.  3. Obesity, current BMI 50.9 Bethany Lynch is currently in the action stage of change. As such, her goal is to continue with weight loss efforts. She has agreed to the Category 2  Plan.   [name] will continue meal planning and she will continue intentional eating. She will keep protein and water high.  Exercise goals:  Bethany Lynch will resume water aerobics and water walking.   Behavioral modification strategies: increasing lean protein intake, decreasing simple carbohydrates, increasing vegetables, increasing water intake, decreasing eating out, no skipping meals, meal planning and cooking strategies, keeping healthy foods in the home, and planning for success.  Bethany Lynch has agreed to follow-up with our clinic in 3-4 weeks. She was informed of the importance of frequent follow-up visits to maximize her success with intensive lifestyle modifications for her multiple health conditions.   Objective:   Blood pressure (!) 146/76, pulse 61, temperature 97.6 F (36.4 C), height 5\' 4"  (1.626 m), weight 296 lb (134.3 kg), SpO2 97 %. Body mass index is 50.81 kg/m.  General: Cooperative, alert, well developed, in no acute distress. HEENT: Conjunctivae and lids unremarkable. Cardiovascular: Regular rhythm.  Lungs: Normal work of breathing. Neurologic: No focal deficits.   Lab Results  Component Value Date   CREATININE 0.90 10/09/2021   BUN 35 (H) 10/09/2021   NA 140 10/09/2021   K 4.6 10/09/2021   CL 101 10/09/2021   CO2 24 10/09/2021   Lab Results  Component Value Date   ALT 23 10/09/2021   AST 18 10/09/2021   ALKPHOS 93 10/09/2021   BILITOT 0.4 10/09/2021   Lab Results  Component Value Date   HGBA1C 6.3 (H) 10/09/2021   HGBA1C 6.3 (H) 01/28/2021   Lab Results  Component Value Date   INSULIN 3.7 01/28/2021   Lab Results  Component Value Date   TSH 2.830 09/28/2018   Lab Results  Component Value Date   CHOL  150 10/09/2021   HDL 50 10/09/2021   LDLCALC 79 10/09/2021   TRIG 120 10/09/2021   Lab Results  Component Value Date   VD25OH 60.3 10/09/2021   VD25OH 54.8 01/28/2021   No results found for: WBC, HGB, HCT, MCV, PLT No results found for: IRON,  TIBC, FERRITIN  Attestation Statements:   Reviewed by clinician on day of visit: allergies, medications, problem list, medical history, surgical history, family history, social history, and previous encounter notes.  I, Lizbeth Bark, RMA, am acting as Location manager for [employer], DO.  I have reviewed the above documentation for accuracy and completeness, and I agree with the above. Jearld Lesch, DO

## 2021-12-29 ENCOUNTER — Encounter (INDEPENDENT_AMBULATORY_CARE_PROVIDER_SITE_OTHER): Payer: Self-pay | Admitting: Bariatrics

## 2022-01-21 ENCOUNTER — Ambulatory Visit (INDEPENDENT_AMBULATORY_CARE_PROVIDER_SITE_OTHER): Payer: Medicare Other | Admitting: Bariatrics

## 2022-01-26 ENCOUNTER — Ambulatory Visit (INDEPENDENT_AMBULATORY_CARE_PROVIDER_SITE_OTHER): Payer: Medicare Other | Admitting: Bariatrics

## 2022-01-26 ENCOUNTER — Other Ambulatory Visit: Payer: Self-pay

## 2022-01-26 ENCOUNTER — Encounter (INDEPENDENT_AMBULATORY_CARE_PROVIDER_SITE_OTHER): Payer: Self-pay | Admitting: Bariatrics

## 2022-01-26 VITALS — BP 148/74 | HR 56 | Temp 97.5°F | Ht 64.0 in | Wt 299.0 lb

## 2022-01-26 DIAGNOSIS — E1169 Type 2 diabetes mellitus with other specified complication: Secondary | ICD-10-CM | POA: Diagnosis not present

## 2022-01-26 DIAGNOSIS — E669 Obesity, unspecified: Secondary | ICD-10-CM

## 2022-01-26 DIAGNOSIS — E785 Hyperlipidemia, unspecified: Secondary | ICD-10-CM | POA: Diagnosis not present

## 2022-01-26 DIAGNOSIS — Z6841 Body Mass Index (BMI) 40.0 and over, adult: Secondary | ICD-10-CM | POA: Diagnosis not present

## 2022-01-26 DIAGNOSIS — Z794 Long term (current) use of insulin: Secondary | ICD-10-CM

## 2022-01-26 DIAGNOSIS — Z7984 Long term (current) use of oral hypoglycemic drugs: Secondary | ICD-10-CM

## 2022-01-26 NOTE — Progress Notes (Signed)
Chief Complaint:   OBESITY Bethany Lynch is here to discuss her progress with her obesity treatment plan along with follow-up of her obesity related diagnoses. Bethany Lynch is on the Category 2 Plan and states she is following her eating plan approximately 0% of the time. Bethany Lynch states she is walking more.  Today's visit was #: 23 Starting weight: 339 lbs Starting date: 01/28/2021 Today's weight: 299 lbs Today's date: 01/26/2022 Total lbs lost to date: 40 lbs Total lbs lost since last in-office visit: 0  Interim History: Bethany Lynch is up 3 lbs since her last visit. She is up 1/2 lbs of water per the bioimpedance scale.   Subjective:   1. Hyperlipidemia associated with type 2 diabetes mellitus (Bethany Lynch) Bethany Lynch is taking Crestor currently.  2. Type 2 diabetes mellitus with other specified complication, with long-term current use of insulin (HCC) Bethany Lynch is currently taking [place], [place] ,Tresiba ,and Metformin.   Assessment/Plan:   1. Hyperlipidemia associated with type 2 diabetes mellitus ([place]) Cardiovascular risk and specific lipid/LDL goals reviewed.  Bethany Lynch will continue taking Crestor. We discussed several lifestyle modifications today and Bethany Lynch will continue to work on diet, exercise and weight loss efforts. Orders and follow up as documented in patient record.   Counseling Intensive lifestyle modifications are the first line treatment for this issue. Dietary changes: Increase soluble fiber. Decrease simple carbohydrates. Exercise changes: Moderate to vigorous-intensity aerobic activity 150 minutes per week if tolerated. Lipid-lowering medications: see documented in medical record.  2. Type 2 diabetes mellitus with other specified complication, with long-term current use of insulin (HCC) The goal is to keep insulin level down and to decrease insulin. She will wear her compression stockings. Good blood sugar control is important to decrease the likelihood of diabetic complications such as  nephropathy, neuropathy, limb loss, blindness, coronary artery disease, and death. Intensive lifestyle modification including diet, exercise and weight loss are the first line of treatment for diabetes.   3. Obesity, current BMI 51.4 Bethany Lynch is currently in the action stage of change. As such, her goal is to continue with weight loss efforts. She has agreed to the Category 2 Plan.   Bethany Lynch will continue meal planning. She will stick to the plan 80-90% ( calories 1200 and protein 80 grams).  Exercise goals:  As is.  Behavioral modification strategies: increasing lean protein intake, decreasing simple carbohydrates, increasing vegetables, increasing water intake, decreasing eating out, no skipping meals, meal planning and cooking strategies, keeping healthy foods in the home, and planning for success.  Bethany Lynch has agreed to follow-up with our clinic in 3-4 weeks (fasting). She was informed of the importance of frequent follow-up visits to maximize her success with intensive lifestyle modifications for her multiple health conditions.   Objective:   Blood pressure (!) 148/74, pulse (!) 56, temperature (!) 97.5 F (36.4 C), height 5\' 4"  (1.626 m), weight 299 lb (135.6 kg), SpO2 97 %. Body mass index is 51.32 kg/m.  General: Cooperative, alert, well developed, in no acute distress. HEENT: Conjunctivae and lids unremarkable. Cardiovascular: Regular rhythm.  Lungs: Normal work of breathing. Neurologic: No focal deficits.   Lab Results  Component Value Date   CREATININE 0.90 10/09/2021   BUN 35 (H) 10/09/2021   NA 140 10/09/2021   K 4.6 10/09/2021   CL 101 10/09/2021   CO2 24 10/09/2021   Lab Results  Component Value Date   ALT 23 10/09/2021   AST 18 10/09/2021   ALKPHOS 93 10/09/2021   BILITOT 0.4 10/09/2021  Lab Results  Component Value Date   HGBA1C 6.3 (H) 10/09/2021   HGBA1C 6.3 (H) 01/28/2021   Lab Results  Component Value Date   INSULIN 3.7 01/28/2021   Lab Results   Component Value Date   TSH 2.830 09/28/2018   Lab Results  Component Value Date   CHOL 150 10/09/2021   HDL 50 10/09/2021   LDLCALC 79 10/09/2021   TRIG 120 10/09/2021   Lab Results  Component Value Date   VD25OH 60.3 10/09/2021   VD25OH 54.8 01/28/2021   No results found for: WBC, HGB, HCT, MCV, PLT No results found for: IRON, TIBC, FERRITIN  Attestation Statements:   Reviewed by clinician on day of visit: allergies, medications, problem list, medical history, surgical history, family history, social history, and previous encounter notes.  I, Lizbeth Bark, RMA, am acting as Location manager for [employer], DO.  I have reviewed the above documentation for accuracy and completeness, and I agree with the above. Jearld Lesch, DO

## 2022-02-09 ENCOUNTER — Other Ambulatory Visit: Payer: Self-pay

## 2022-02-09 ENCOUNTER — Encounter: Payer: Self-pay | Admitting: Cardiology

## 2022-02-09 ENCOUNTER — Ambulatory Visit (INDEPENDENT_AMBULATORY_CARE_PROVIDER_SITE_OTHER): Payer: Medicare Other | Admitting: Cardiology

## 2022-02-09 VITALS — BP 152/60 | HR 65 | Ht 64.0 in | Wt 303.0 lb

## 2022-02-09 DIAGNOSIS — R001 Bradycardia, unspecified: Secondary | ICD-10-CM

## 2022-02-09 DIAGNOSIS — E782 Mixed hyperlipidemia: Secondary | ICD-10-CM

## 2022-02-09 DIAGNOSIS — I350 Nonrheumatic aortic (valve) stenosis: Secondary | ICD-10-CM

## 2022-02-09 DIAGNOSIS — E1159 Type 2 diabetes mellitus with other circulatory complications: Secondary | ICD-10-CM | POA: Diagnosis not present

## 2022-02-09 DIAGNOSIS — I152 Hypertension secondary to endocrine disorders: Secondary | ICD-10-CM

## 2022-02-09 NOTE — Patient Instructions (Signed)
Medication Instructions:  ?Your physician recommends that you continue on your current medications as directed. Please refer to the Current Medication list given to you today. ? ?*If you need a refill on your cardiac medications before your next appointment, please call your pharmacy* ? ? ?Lab Work: ?None ?If you have labs (blood work) drawn today and your tests are completely normal, you will receive your results only by: ?MyChart Message (if you have MyChart) OR ?A paper copy in the mail ?If you have any lab test that is abnormal or we need to change your treatment, we will call you to review the results. ? ? ?Testing/Procedures: ?Your physician has requested that you have a carotid duplex. This test is an ultrasound of the carotid arteries in your neck. It looks at blood flow through these arteries that supply the brain with blood. Allow one hour for this exam. There are no restrictions or special instructions.  ? ? ?Follow-Up: ?At [facility], you and your health needs are our priority.  As part of our continuing mission to provide you with exceptional heart care, we have created designated Provider Care Teams.  These Care Teams include your primary Cardiologist (physician) and Advanced Practice Providers (APPs -  Physician Assistants and Nurse Practitioners) who all work together to provide you with the care you need, when you need it. ? ?We recommend signing up for the patient portal called "MyChart".  Sign up information is provided on this After Visit Summary.  MyChart is used to connect with patients for Virtual Visits (Telemedicine).  Patients are able to view lab/test results, encounter notes, upcoming appointments, etc.  Non-urgent messages can be sent to your provider as well.   ?To learn more about what you can do with MyChart, go to [email or web address].   ? ?Your next appointment:   ?5 month(s) ? ?The format for your next appointment:   ?In Person ? ?Provider:   ?Robert Krasowski, MD   ? ? ?Other Instructions ?None ? ?

## 2022-02-09 NOTE — Progress Notes (Signed)
?Cardiology Office Note:   ? ?Date:  02/09/2022  ? ?ID:  Delsy Etzkorn, DOB 05-17-1951, MRN [id number] ? ?PCP:  Tamsen Snider, NP  ?Cardiologist:  Gypsy Balsam, MD   ? ?Referring MD: Hal Morales, NP  ? ?Chief Complaint  ?Patient presents with  ? Medication Management  ?I am doing fine ? ?History of Present Illness:   ? ?Bethany Lynch is a 71 y.o. female with past medical history significant for diabetes, essential hypertension, morbid obesity, with to 59% stenosis in the left carotic artery last check in December 2021, aortic stenosis which is mild. ?She was referred to [place] originally because of bradycardia during the colonoscopy.  She did wear a monitor monitor surprisingly show short run of nonsustained ventricular tachycardia.  She does not have any dizziness there is no chest pain tightness squeezing pressure burning chest.  Her echocardiogram has been performed which showed preserved left ventricle ejection fraction.  She is working on weight loss she lost already 45 pounds which I congratulated her for.  She is getting ready to go to [place] in the summertime she would like to be able to walk around.  She is working with our [employer] program at [facility] with great success.  She is feeling much better already have more energy. ? ?Past Medical History:  ?Diagnosis Date  ? Anxiety   ? Aortic stenosis 10/19/2019  ? Arthritis   ? Back pain   ? CFS (chronic fatigue syndrome)   ? Constipation   ? Diabetes (HCC)   ? Edema of both lower extremities   ? Essential hypertension 09/28/2018  ? GERD (gastroesophageal reflux disease)   ? Glaucoma   ? Hyperlipidemia 09/28/2018  ? IBS (irritable bowel syndrome)   ? Joint pain   ? Low energy   ? Lymphedema of lower extremity 01/02/2019  ? Palpitations   ? Shortness of breath 09/28/2018  ? SOB (shortness of breath)   ? Stenosis of left carotid artery 09/28/2018  ? Swallowing difficulty   ? ? ?Past Surgical History:  ?Procedure Laterality Date  ? GALLBLADDER SURGERY   07/27/1988  ? HERNIA REPAIR  04/04/2012  ? HERNIA REPAIR  09/09/2015  ? HYESTERECTOMY  02/06/1991  ? ? ?Current Medications: ?Current Meds  ?Medication Sig  ? aspirin 81 MG chewable tablet Chew by mouth daily.  ? calcium-vitamin D (OSCAL WITH D) 500-200 MG-UNIT tablet Take 2 tablets by mouth.  ? cetirizine (ZYRTEC) 10 MG tablet Take 10 mg by mouth daily.  ? Cholecalciferol 50 MCG (2000 UT) CAPS Take by mouth daily as needed.  ? esomeprazole (NEXIUM) 40 MG capsule Take 40 mg by mouth daily.  ? FARXIGA 10 MG TABS tablet Take 10 mg by mouth daily.  ? furosemide (LASIX) 40 MG tablet Take 1 tablet (40 mg total) by mouth daily.  ? hydrALAZINE (APRESOLINE) 50 MG tablet Take 1 tablet by mouth 3 (three) times daily.  ? LORazepam (ATIVAN) 0.5 MG tablet TAKE 1 TABLET TWICE A DAY AS NEEDED FOR ANXIETY.  ? metFORMIN (GLUCOPHAGE) 500 MG tablet Take 500 mg by mouth 2 (two) times daily.  ? Misc Natural Products (TART CHERRY ADVANCED PO) Take by mouth daily as needed.  ? Multiple Vitamins-Minerals (VISION PLUS PO) Take by mouth daily as needed.  ? NON FORMULARY ORGANIC TUMERIC CURCUMIN  WITH GINGER AND BIOPERINE DIETARY SUPPLEMENT TAKE AS NEEDED  ? NOVOLOG FLEXPEN 100 UNIT/ML FlexPen Inject 11 Units into the muscle 3 (three) times daily with meals.  ?  Pediatric Multivitamins-Fl (MULTIVITAMIN DROPS/FLUORIDE PO) Take 1 tablet by mouth daily.  ? rosuvastatin (CRESTOR) 10 MG tablet Take 1 tablet (10 mg total) by mouth daily.  ? spironolactone (ALDACTONE) 25 MG tablet Take 25 mg by mouth daily. with food  ? telmisartan (MICARDIS) 80 MG tablet Take 1 tablet by mouth daily.  ? TRESIBA FLEXTOUCH 200 UNIT/ML SOPN Inject 80 Units into the skin at bedtime.  ? vitamin E 400 UNIT capsule Take 400 Units by mouth daily.  ? zinc gluconate 50 MG tablet Take 50 mg by mouth daily.  ?  ? ?Allergies:   Liraglutide  ? ?Social History  ? ?Socioeconomic History  ? Marital status: Married  ?  Spouse name: Broadus John  ? Number of children: Not on file  ? Years  of education: Not on file  ? Highest education level: Not on file  ?Occupational History  ? 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 Smoking status: Never  ? Smokeless tobacco: Never  ?Substance and Sexual Activity  ? Alcohol use: Not on file  ? Drug use: Not on file  ? Sexual activity: Not on file  ?Other Topics Concern  ? Not on file  ?Social History Narrative  ? Not on file  ? ?Social Determinants of Health  ? ?Financial Resource Strain: Not on file  ?Food Insecurity: Not on file  ?Transportation Needs: Not on file  ?Physical Activity: Not on file  ?Stress: Not on file  ?Social Connections: Not on file  ?  ? ?Family History: ?The patient's family history includes Colon cancer in her mother; Diabetes in her mother; Diabetes type II in her mother; Heart disease in her father; High Cholesterol in her father and mother; High blood pressure in her father and mother; Leukemia in her father; Memory loss in her mother; Obesity in her father and mother; Sleep apnea in her father; Stroke in her father. ?ROS:   ?Please see the history of present illness.    ?All 14 point review of systems negative except as described per history of present illness ? ?EKGs/Labs/Other Studies Reviewed:   ? ? ? ?Recent Labs: ?10/09/2021: ALT 23; BUN 35; Creatinine, Ser 0.90; Potassium 4.6; Sodium 140  ?Recent Lipid Panel ?   ?Component Value Date/Time  ? CHOL 150 10/09/2021 0953  ? TRIG 120 10/09/2021 0953  ? HDL 50 10/09/2021 0953  ? LDLCALC 79 10/09/2021 0953  ? ? ?Physical Exam:   ? ?VS:  BP (!) 152/60 (BP Location: Left Arm, Patient Position: Sitting)   Pulse 65   Ht 5\' 4"  (1.626 m)   Wt (!) 303 lb (137.4 kg)   SpO2 98%   BMI 52.01 kg/m?    ? ?Wt Readings from Last 3 Encounters:  ?02/09/22 (!) 303 lb (137.4 kg)  ?01/26/22 299 lb (135.6 kg)  ?12/25/21 296 lb (134.3 kg)  ?  ? ?GEN:  Well nourished, well developed in no acute distress ?HEENT: Normal ?NECK: No JVD; No carotid bruits ?LYMPHATICS: No  lymphadenopathy ?CARDIAC: RRR, no murmurs, no rubs, no gallops ?RESPIRATORY:  Clear to auscultation without rales, wheezing or rhonchi  ?ABDOMEN: Soft, non-tender, non-distended ?MUSCULOSKELETAL:  No edema; No deformity  ?SKIN: Warm and dry ?LOWER EXTREMITIES: no swelling ?NEUROLOGIC:  Alert and oriented x 3 ?PSYCHIATRIC:  Normal affect  ? ?ASSESSMENT:   ? ?1. Hypertension associated with type 2 diabetes mellitus (HCC)   ?2. Nonrheumatic aortic valve stenosis   ?3. Mixed hyperlipidemia   ?4. Bradycardia   ? ?  PLAN:   ? ?In order of problems listed above: ? ?Nonrheumatic arctic valve stenosis only mild based on echocardiogram done last year.  We will continue monitoring. ?Sinus bradycardia that happened during the colonoscopy.  Monitor did not show any significant bradycardia.  Continue present management. ?Dyslipidemia: I did review K PN from November show LDL of 79 HDL 50 she is on Crestor 10 which I will continue ?Diabetes gradual improvement since she is losing weight and also trying to avoid carbohydrates.  Her hemoglobin A1c 6.3 from October 09, 2021. ?Overall she is doing great.  She is losing significant mount of weight and I encouraged her to stay on track.  We will schedule her to have carotic ultrasounds. ?[place] go [place] will [place] in a plane she had some question about this from my point of view that should be no objections. ? ? ?Medication Adjustments/Labs and Tests Ordered: ?Current medicines are reviewed at length with the patient today.  Concerns regarding medicines are outlined above.  ?No orders of the defined types were placed in this encounter. ? ?Medication changes: No orders of the defined types were placed in this encounter. ? ? ?Signed, ?Georgeanna Leaobert J. Joycelynn Fritsche, MD, [facility] ?02/09/2022 4:08 PM    ?[facility] ?

## 2022-02-23 ENCOUNTER — Ambulatory Visit (INDEPENDENT_AMBULATORY_CARE_PROVIDER_SITE_OTHER): Payer: Medicare Other | Admitting: Bariatrics

## 2022-02-23 ENCOUNTER — Encounter (INDEPENDENT_AMBULATORY_CARE_PROVIDER_SITE_OTHER): Payer: Self-pay | Admitting: Bariatrics

## 2022-02-23 ENCOUNTER — Other Ambulatory Visit: Payer: Self-pay

## 2022-02-23 VITALS — BP 145/84 | HR 65 | Temp 97.5°F | Ht 64.0 in | Wt 298.0 lb

## 2022-02-23 DIAGNOSIS — E1169 Type 2 diabetes mellitus with other specified complication: Secondary | ICD-10-CM

## 2022-02-23 DIAGNOSIS — I152 Hypertension secondary to endocrine disorders: Secondary | ICD-10-CM

## 2022-02-23 DIAGNOSIS — Z6841 Body Mass Index (BMI) 40.0 and over, adult: Secondary | ICD-10-CM

## 2022-02-23 DIAGNOSIS — E559 Vitamin D deficiency, unspecified: Secondary | ICD-10-CM

## 2022-02-23 DIAGNOSIS — E782 Mixed hyperlipidemia: Secondary | ICD-10-CM

## 2022-02-23 DIAGNOSIS — E1159 Type 2 diabetes mellitus with other circulatory complications: Secondary | ICD-10-CM

## 2022-02-23 DIAGNOSIS — E669 Obesity, unspecified: Secondary | ICD-10-CM

## 2022-02-24 ENCOUNTER — Encounter (INDEPENDENT_AMBULATORY_CARE_PROVIDER_SITE_OTHER): Payer: Self-pay | Admitting: Bariatrics

## 2022-02-24 LAB — COMPREHENSIVE METABOLIC PANEL
ALT: 20 IU/L (ref 0–32)
AST: 19 IU/L (ref 0–40)
Albumin/Globulin Ratio: 1.7 (ref 1.2–2.2)
Albumin: 4.2 g/dL (ref 3.8–4.8)
Alkaline Phosphatase: 75 IU/L (ref 44–121)
BUN/Creatinine Ratio: 30 — ABNORMAL HIGH (ref 12–28)
BUN: 30 mg/dL — ABNORMAL HIGH (ref 8–27)
Bilirubin Total: 0.3 mg/dL (ref 0.0–1.2)
CO2: 23 mmol/L (ref 20–29)
Calcium: 9.9 mg/dL (ref 8.7–10.3)
Chloride: 104 mmol/L (ref 96–106)
Creatinine, Ser: 1 mg/dL (ref 0.57–1.00)
Globulin, Total: 2.5 g/dL (ref 1.5–4.5)
Glucose: 88 mg/dL (ref 70–99)
Potassium: 5 mmol/L (ref 3.5–5.2)
Sodium: 143 mmol/L (ref 134–144)
Total Protein: 6.7 g/dL (ref 6.0–8.5)
eGFR: 61 mL/min/{1.73_m2} (ref >59)

## 2022-02-24 LAB — LIPID PANEL WITH LDL/HDL RATIO
Cholesterol, Total: 155 mg/dL (ref 100–199)
HDL: 47 mg/dL (ref >39)
LDL Chol Calc (NIH): 86 mg/dL (ref 0–99)
LDL/HDL Ratio: 1.8 ratio (ref 0.0–3.2)
Triglycerides: 121 mg/dL (ref 0–149)
VLDL Cholesterol Cal: 22 mg/dL (ref 5–40)

## 2022-02-24 LAB — HEMOGLOBIN A1C
Est. average glucose Bld gHb Est-mCnc: 131 mg/dL
Hgb A1c MFr Bld: 6.2 % — ABNORMAL HIGH (ref 4.8–5.6)

## 2022-02-24 LAB — VITAMIN D 25 HYDROXY (VIT D DEFICIENCY, FRACTURES): Vit D, 25-Hydroxy: 51.6 ng/mL (ref 30.0–100.0)

## 2022-02-24 LAB — INSULIN, RANDOM: INSULIN: 3.5 u[IU]/mL (ref 2.6–24.9)

## 2022-02-24 NOTE — Progress Notes (Signed)
? ? ? ?Chief Complaint:  ? ?OBESITY ?Skylee is here to discuss her progress with her obesity treatment plan along with follow-up of her obesity related diagnoses. Thandi is on the Category 2 Plan and states she is following her eating plan approximately 80% of the time. Lynnsey states she is doing water aerobics for 45 minutes 1 times per week and walking for 25 minutes 1 times per week. ? ?Today's visit was #: 19 ?Starting weight: 339 lbs ?Starting date: 01/28/2021 ?Today's weight: 298 lbs ?Today's date: 02/23/2022 ?Total lbs lost to date: 41 lbs ?Total lbs lost since last in-office visit: 1 lb ? ?Interim History: Yareli is down 1 lb since her last visit.  ? ?Subjective:  ? ?1. Hypertension associated with type 2 diabetes mellitus ([place]) ?Diannah is currently taking Aldactone and Micardis.  ? ?2. Diabetes mellitus type 2 in obese [facility]) ?We discussed Diabetes mellitus type 2 obese today.  ? ?3. Mixed hyperlipidemia ?Zaylyn is taking Crestor currently.  ? ?4. Vitamin D deficiency ?Payzlie is currently on Calcium Vitamin D. ? ?Assessment/Plan:  ? ?1. Hypertension associated with type 2 diabetes mellitus ([place]) ?We will check CMP today. Isaura is working on healthy weight loss and exercise to improve blood pressure control. We will watch for signs of hypotension as she continues her lifestyle modifications. ? ?- Lipid Panel With LDL/HDL Ratio ?- Comprehensive metabolic panel ? ?2. Diabetes mellitus type 2 in obese [facility]) ?We will check A1C and insulin today. Good blood sugar control is important to decrease the likelihood of diabetic complications such as nephropathy, neuropathy, limb loss, blindness, coronary artery disease, and death. Intensive lifestyle modification including diet, exercise and weight loss are the first line of treatment for diabetes.  ? ?- Insulin, random ?- Hemoglobin A1c ?- Comprehensive metabolic panel ? ?3. Mixed hyperlipidemia ?Cardiovascular risk and specific lipid/LDL goals reviewed.  Kathrynne will  continue taking Crestor. We will check Lipids and CMP today. We discussed several lifestyle modifications today and Jadine will continue to work on diet, exercise and weight loss efforts. Orders and follow up as documented in patient record.  ? ?Counseling ?Intensive lifestyle modifications are the first line treatment for this issue. ?Dietary changes: Increase soluble fiber. Decrease simple carbohydrates. ?Exercise changes: Moderate to vigorous-intensity aerobic activity 150 minutes per week if tolerated. ?Lipid-lowering medications: see documented in medical record. ? ?- Lipid Panel With LDL/HDL Ratio ?- Comprehensive metabolic panel ? ?4. Vitamin D deficiency ?Low Vitamin D level contributes to fatigue and are associated with obesity, breast, and colon cancer. We will check Vitamin D today andRobbie will follow-up for routine testing of Vitamin D, at least 2-3 times per year to avoid over-replacement. ? ?- VITAMIN D 25 Hydroxy (Vit-D Deficiency, Fractures) ? ?5. Obesity, current BMI 51.3 ?Mylea is currently in the action stage of change. As such, her goal is to continue with weight loss efforts. She has agreed to the Category 2 Plan and keeping a food journal and adhering to recommended goals of 1200 calories and 80 grams of protein.  ? ?Harshita will continue meal planning. She will adhere to the plan 80-90%.  ? ?Exercise goals:  As is.  ? ?Behavioral modification strategies: increasing lean protein intake, decreasing simple carbohydrates, increasing vegetables, increasing water intake, decreasing eating out, no skipping meals, meal planning and cooking strategies, keeping healthy foods in the home, and planning for success. ? ?Sude has agreed to follow-up with our clinic in 4 weeks. She was informed of the importance of frequent follow-up visits  to maximize her success with intensive lifestyle modifications for her multiple health conditions.  ? ?Cathyjo was informed we would discuss her lab results at her next  visit unless there is a critical issue that needs to be addressed sooner. Celestial agreed to keep her next visit at the agreed upon time to discuss these results. ? ?Objective:  ? ?Blood pressure (!) 145/84, pulse 65, temperature (!) 97.5 ?F (36.4 ?C), height 5\' 4"  (1.626 m), weight 298 lb (135.2 kg), SpO2 98 %. ?Body mass index is 51.15 kg/m?. ? ?General: Cooperative, alert, well developed, in no acute distress. ?HEENT: Conjunctivae and lids unremarkable. ?Cardiovascular: Regular rhythm.  ?Lungs: Normal work of breathing. ?Neurologic: No focal deficits.  ? ?Lab Results  ?Component Value Date  ? CREATININE 1.00 02/23/2022  ? BUN 30 (H) 02/23/2022  ? NA 143 02/23/2022  ? K 5.0 02/23/2022  ? CL 104 02/23/2022  ? CO2 23 02/23/2022  ? ?Lab Results  ?Component Value Date  ? ALT 20 02/23/2022  ? AST 19 02/23/2022  ? ALKPHOS 75 02/23/2022  ? BILITOT 0.3 02/23/2022  ? ?Lab Results  ?Component Value Date  ? HGBA1C 6.2 (H) 02/23/2022  ? HGBA1C 6.3 (H) 10/09/2021  ? HGBA1C 6.3 (H) 01/28/2021  ? ?Lab Results  ?Component Value Date  ? INSULIN 3.5 02/23/2022  ? INSULIN 3.7 01/28/2021  ? ?Lab Results  ?Component Value Date  ? TSH 2.830 09/28/2018  ? ?Lab Results  ?Component Value Date  ? CHOL 155 02/23/2022  ? HDL 47 02/23/2022  ? [place] 86 02/23/2022  ? TRIG 121 02/23/2022  ? ?Lab Results  ?Component Value Date  ? VD25OH 51.6 02/23/2022  ? VD25OH 60.3 10/09/2021  ? VD25OH 54.8 01/28/2021  ? ?No results found for: WBC, HGB, HCT, MCV, PLT ?No results found for: IRON, TIBC, FERRITIN ? ?Attestation Statements:  ? ?Reviewed by clinician on day of visit: allergies, medications, problem list, medical history, surgical history, family history, social history, and previous encounter notes. ? ?I, Lizbeth Bark, RMA, am acting as transcriptionist for [employer], DO. ? ?I have reviewed the above documentation for accuracy and completeness, and I agree with the above. Jearld Lesch, DO  ?

## 2022-03-25 ENCOUNTER — Ambulatory Visit (INDEPENDENT_AMBULATORY_CARE_PROVIDER_SITE_OTHER): Payer: Medicare Other | Admitting: Bariatrics

## 2022-03-25 ENCOUNTER — Encounter (INDEPENDENT_AMBULATORY_CARE_PROVIDER_SITE_OTHER): Payer: Self-pay | Admitting: Bariatrics

## 2022-03-25 VITALS — BP 132/68 | HR 60 | Temp 97.7°F | Ht 64.0 in | Wt 297.0 lb

## 2022-03-25 DIAGNOSIS — E1169 Type 2 diabetes mellitus with other specified complication: Secondary | ICD-10-CM | POA: Diagnosis not present

## 2022-03-25 DIAGNOSIS — Z6841 Body Mass Index (BMI) 40.0 and over, adult: Secondary | ICD-10-CM

## 2022-03-25 DIAGNOSIS — E669 Obesity, unspecified: Secondary | ICD-10-CM

## 2022-03-25 DIAGNOSIS — Z794 Long term (current) use of insulin: Secondary | ICD-10-CM

## 2022-03-25 DIAGNOSIS — K219 Gastro-esophageal reflux disease without esophagitis: Secondary | ICD-10-CM | POA: Diagnosis not present

## 2022-04-02 NOTE — Progress Notes (Signed)
? ? ? ?Chief Complaint:  ? ?OBESITY ?Bethany Lynch is here to discuss her progress with her obesity treatment plan along with follow-up of her obesity related diagnoses. Bethany Lynch is on the Category 2 Plan and states she is following her eating plan approximately 80% of the time. Bethany Lynch states she is doing water exercises  35-40 minutes 2 times per week. ? ?Today's visit was #: 20 ?Starting weight: 339 lbs ?Starting date: 01/28/2021 ?Today's weight: 297 lbs ?Today's date: 03/25/2022 ?Total lbs lost to date: 42 lbs ?Total lbs lost since last in-office visit: 1 lb ? ?Interim History: Bethany Lynch is down an additional lb since her last visit. ? ?Subjective:  ? ?1. Diabetes mellitus type 2 in obese Bethany Lynch) ?Bethany Lynch is currently taking [place] 11 unit three times daily. Bethany Lynch's A1c level is 6.2, Insulin level is 3.5 ? ?2. Gastroesophageal reflux disease, unspecified whether esophagitis present ?Bethany Lynch is currently taking Nexium. ? ?Assessment/Plan:  ? ?1. Diabetes mellitus type 2 in obese Bethany Lynch) ?Bethany Lynch will continue taking Bethany Lynch. ? ?2. Gastroesophageal reflux disease, unspecified whether esophagitis present ?Bethany Lynch will  ? ?3. Obesity, Current BMI 51.0 ?Bethany Lynch is currently in the action stage of change. As such, her goal is to continue with weight loss efforts. She has agreed to the Category 2 Plan.  ? ?Bethany Lynch agreed to meal planning. ?Review labs from 02/23/2022 (CMP, Lipids, Vit D, A1c, & insulin), reviewed labs from her PCP has well. ?Bethany Lynch will increase her water intake. ?Exercise goals: Continue with water aerobics. ? ?Behavioral modification strategies: increasing lean protein intake, decreasing simple carbohydrates, increasing vegetables, increasing water intake, decreasing eating out, no skipping meals, meal planning and cooking strategies, keeping healthy foods in the home, and planning for success. ? ?Bethany Lynch has agreed to follow-up with our clinic in 5 weeks. She was informed of the importance of frequent  follow-up visits to maximize her success with intensive lifestyle modifications for her multiple health conditions.  ? ?Objective:  ? ?Blood pressure 132/68, pulse 60, temperature 97.7 ?F (36.5 ?C), height 5\' 4"  (1.626 m), weight 297 lb (134.7 kg), SpO2 97 %. ?Body mass index is 50.98 kg/m?. ? ?General: Cooperative, alert, well developed, in no acute distress. ?HEENT: Conjunctivae and lids unremarkable. ?Cardiovascular: Regular rhythm.  ?Lungs: Normal work of breathing. ?Neurologic: No focal deficits.  ? ?Lab Results  ?Component Value Date  ? CREATININE 1.00 02/23/2022  ? BUN 30 (H) 02/23/2022  ? NA 143 02/23/2022  ? K 5.0 02/23/2022  ? CL 104 02/23/2022  ? CO2 23 02/23/2022  ? ?Lab Results  ?Component Value Date  ? ALT 20 02/23/2022  ? AST 19 02/23/2022  ? ALKPHOS 75 02/23/2022  ? BILITOT 0.3 02/23/2022  ? ?Lab Results  ?Component Value Date  ? HGBA1C 6.2 (H) 02/23/2022  ? HGBA1C 6.3 (H) 10/09/2021  ? HGBA1C 6.3 (H) 01/28/2021  ? ?Lab Results  ?Component Value Date  ? INSULIN 3.5 02/23/2022  ? INSULIN 3.7 01/28/2021  ? ?Lab Results  ?Component Value Date  ? TSH 2.830 09/28/2018  ? ?Lab Results  ?Component Value Date  ? CHOL 155 02/23/2022  ? HDL 47 02/23/2022  ? LDLCALC 86 02/23/2022  ? TRIG 121 02/23/2022  ? ?Lab Results  ?Component Value Date  ? VD25OH 51.6 02/23/2022  ? VD25OH 60.3 10/09/2021  ? VD25OH 54.8 01/28/2021  ? ?No results found for: WBC, HGB, HCT, MCV, PLT ?No results found for: IRON, TIBC, FERRITIN ? ?Obesity Behavioral Intervention:  ? ?Approximately 15 minutes were spent on  the discussion below. ? ?ASK: ?We discussed the diagnosis of obesity with Bethany Lynch today and Bethany Lynch agreed to give [place] permission to discuss obesity behavioral modification therapy today. ? ?ASSESS: ?Bethany Lynch has the diagnosis of obesity and her BMI today is 51. Bethany Lynch is in the action stage of change.  ? ?ADVISE: ?[name] was educated on the multiple health risks of obesity as well as the benefit of weight loss to improve her health.  She was advised of the need for long term treatment and the importance of lifestyle modifications to improve her current health and to decrease her risk of future health problems. ? ?AGREE: ?Multiple dietary modification options and treatment options were discussed and Bethany Lynch agreed to follow the recommendations documented in the above note. ? ?ARRANGE: ?Bethany Lynch was educated on the importance of frequent visits to treat obesity as outlined per CMS and USPSTF guidelines and agreed to schedule her next follow up appointment today. ? ?Attestation Statements:  ? ?Reviewed by clinician on day of visit: allergies, medications, problem list, medical history, surgical history, family history, social history, and previous encounter notes. ? ?I, Paulla Fore, am acting as Energy manager for [employer], DO. ? ?I have reviewed the above documentation for accuracy and completeness, and I agree with the above. [name], DO ? ?

## 2022-04-04 ENCOUNTER — Encounter (INDEPENDENT_AMBULATORY_CARE_PROVIDER_SITE_OTHER): Payer: Self-pay | Admitting: Bariatrics

## 2022-05-14 ENCOUNTER — Ambulatory Visit (INDEPENDENT_AMBULATORY_CARE_PROVIDER_SITE_OTHER): Payer: Medicare Other | Admitting: Bariatrics

## 2022-05-14 ENCOUNTER — Encounter (INDEPENDENT_AMBULATORY_CARE_PROVIDER_SITE_OTHER): Payer: Self-pay | Admitting: Bariatrics

## 2022-05-14 VITALS — BP 151/70 | HR 53 | Temp 97.9°F | Ht 64.0 in | Wt 299.0 lb

## 2022-05-14 DIAGNOSIS — E1169 Type 2 diabetes mellitus with other specified complication: Secondary | ICD-10-CM

## 2022-05-14 DIAGNOSIS — E669 Obesity, unspecified: Secondary | ICD-10-CM

## 2022-05-14 DIAGNOSIS — I890 Lymphedema, not elsewhere classified: Secondary | ICD-10-CM

## 2022-05-14 DIAGNOSIS — Z6841 Body Mass Index (BMI) 40.0 and over, adult: Secondary | ICD-10-CM

## 2022-05-14 DIAGNOSIS — Z7984 Long term (current) use of oral hypoglycemic drugs: Secondary | ICD-10-CM

## 2022-05-14 DIAGNOSIS — Z794 Long term (current) use of insulin: Secondary | ICD-10-CM

## 2022-05-18 NOTE — Progress Notes (Unsigned)
Chief Complaint:   OBESITY Bethany Lynch is here to discuss her progress with her obesity treatment plan along with follow-up of her obesity related diagnoses. Bethany Lynch is on the Category 2 Plan and states she is following her eating plan approximately 25% of the time. Bethany Lynch states she is doing 0 minutes 0 times per week.  Today's visit was #: 21 Starting weight: 339 lbs Starting date: 01/28/2021 Today's weight: 299 lbs Today's date: 05/14/2022 Total lbs lost to date: 40 Total lbs lost since last in-office visit: 0  Interim History: Bethany Lynch is up 2 lbs since her last visit.   Subjective:   1. Diabetes mellitus type 2 in obese Bethany Lynch) Bethany Lynch is taking [place], metformin, Farxiga, and Novolog 11 units.  Her fasting blood sugars range between 80-130, never higher than 150.  2. Lymphedema of both lower extremities Bethany Lynch notes lymphedema of both lower extremities, left more than right.  Assessment/Plan:   1. Diabetes mellitus type 2 in obese [facility]) Bethany Lynch will continue her medications, we will follow-up at her next visit.  2. Lymphedema of both lower extremities Bethany Lynch will do medical lymphedema massages.  See her PCP and ask for lymphedema clinic for lymphedema follow-up.  3. Obesity, Current BMI 51.5 Bethany Lynch is currently in the action stage of change. As such, her goal is to continue with weight loss efforts. She has agreed to the Category 2 Plan.   Increase water intake.  We will follow the plan with vigor 80-90%.   Exercise goals: No exercise has been prescribed at this time.  Behavioral modification strategies: increasing lean protein intake, decreasing simple carbohydrates, increasing vegetables, increasing water intake, decreasing eating out, no skipping meals, meal planning and cooking strategies, keeping healthy foods in the home, and planning for success.  Bethany Lynch has agreed to follow-up with our clinic in 4 weeks. She was informed of the importance of frequent follow-up visits  to maximize her success with intensive lifestyle modifications for her multiple health conditions.   Objective:   Blood pressure (!) 151/70, pulse (!) 53, temperature 97.9 F (36.6 C), height 5\' 4"  (1.626 m), weight 299 lb (135.6 kg), SpO2 97 %. Body mass index is 51.32 kg/m.  General: Cooperative, alert, well developed, in no acute distress. HEENT: Conjunctivae and lids unremarkable. Cardiovascular: Regular rhythm.  Lungs: Normal work of breathing. Neurologic: No focal deficits.   Lab Results  Component Value Date   CREATININE 1.00 02/23/2022   BUN 30 (H) 02/23/2022   NA 143 02/23/2022   K 5.0 02/23/2022   CL 104 02/23/2022   CO2 23 02/23/2022   Lab Results  Component Value Date   ALT 20 02/23/2022   AST 19 02/23/2022   ALKPHOS 75 02/23/2022   BILITOT 0.3 02/23/2022   Lab Results  Component Value Date   HGBA1C 6.2 (H) 02/23/2022   HGBA1C 6.3 (H) 10/09/2021   HGBA1C 6.3 (H) 01/28/2021   Lab Results  Component Value Date   INSULIN 3.5 02/23/2022   INSULIN 3.7 01/28/2021   Lab Results  Component Value Date   TSH 2.830 09/28/2018   Lab Results  Component Value Date   CHOL 155 02/23/2022   HDL 47 02/23/2022   LDLCALC 86 02/23/2022   TRIG 121 02/23/2022   Lab Results  Component Value Date   VD25OH 51.6 02/23/2022   VD25OH 60.3 10/09/2021   VD25OH 54.8 01/28/2021   No results found for: "WBC", "HGB", "HCT", "MCV", "PLT" No results found for: "IRON", "TIBC", "FERRITIN"  Attestation Statements:  Reviewed by clinician on day of visit: allergies, medications, problem list, medical history, surgical history, family history, social history, and previous encounter notes.   Trude Mcburney, am acting as Energy manager for [employer], DO.  I have reviewed the above documentation for accuracy and completeness, and I agree with the above. -  ***

## 2022-05-23 ENCOUNTER — Encounter (INDEPENDENT_AMBULATORY_CARE_PROVIDER_SITE_OTHER): Payer: Self-pay | Admitting: Bariatrics

## 2022-06-05 ENCOUNTER — Encounter: Payer: Self-pay | Admitting: Cardiology

## 2022-06-05 ENCOUNTER — Ambulatory Visit (INDEPENDENT_AMBULATORY_CARE_PROVIDER_SITE_OTHER): Payer: Medicare Other | Admitting: Cardiology

## 2022-06-05 VITALS — BP 162/74 | HR 76 | Ht 64.0 in | Wt 308.4 lb

## 2022-06-05 DIAGNOSIS — I152 Hypertension secondary to endocrine disorders: Secondary | ICD-10-CM

## 2022-06-05 DIAGNOSIS — I350 Nonrheumatic aortic (valve) stenosis: Secondary | ICD-10-CM | POA: Diagnosis not present

## 2022-06-05 DIAGNOSIS — K219 Gastro-esophageal reflux disease without esophagitis: Secondary | ICD-10-CM

## 2022-06-05 DIAGNOSIS — E782 Mixed hyperlipidemia: Secondary | ICD-10-CM

## 2022-06-05 DIAGNOSIS — I6522 Occlusion and stenosis of left carotid artery: Secondary | ICD-10-CM

## 2022-06-05 DIAGNOSIS — E1159 Type 2 diabetes mellitus with other circulatory complications: Secondary | ICD-10-CM | POA: Diagnosis not present

## 2022-06-05 MED ORDER — AMLODIPINE BESYLATE 5 MG PO TABS: 5.0000 mg | ORAL_TABLET | Freq: Every day | ORAL | 3 refills | Status: DC

## 2022-06-05 NOTE — Progress Notes (Unsigned)
Cardiology Office Note:    Date:  06/05/2022   ID:  Bethany Lynch, DOB [birth year], MRN [id number]  PCP:  Tamsen Snider, NP  Cardiologist:  Gypsy Balsam, MD    Referring MD: Tamsen Snider, NP   No chief complaint on file. Doing fin e  History of Present Illness:    Bethany Lynch is a 71 y.o. female  with past medical history significant for diabetes, essential hypertension, morbid obesity, with to 59% stenosis in the left carotic artery last check in December 2021, aortic stenosis which is mild.Comes today 2 months of follow-up.  Overall she is anticipating her trip to [place] and she is very worried about it.  Described to have 2 episodes of chest pain that happened a few months ago at rest walking give her shortness of breath fatigue but no chest pain.  Past Medical History:  Diagnosis Date   Anxiety    Aortic stenosis 10/19/2019   Arthritis    Back pain    CFS (chronic fatigue syndrome)    Constipation    Diabetes (HCC)    Edema of both lower extremities    Essential hypertension 09/28/2018   GERD (gastroesophageal reflux disease)    Glaucoma    Hyperlipidemia 09/28/2018   IBS (irritable bowel syndrome)    Joint pain    Low energy    Lymphedema of lower extremity 01/02/2019   Palpitations    Shortness of breath 09/28/2018   SOB (shortness of breath)    Stenosis of left carotid artery 09/28/2018   Swallowing difficulty     Past Surgical History:  Procedure Laterality Date   GALLBLADDER SURGERY  07/27/1988   HERNIA REPAIR  04/04/2012   HERNIA REPAIR  09/09/2015   HYESTERECTOMY  02/06/1991    Current Medications: No outpatient medications have been marked as taking for the 06/05/22 encounter (Appointment) with Georgeanna Lea, MD.     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  Tobacco Use   Smoking status: Never   Smokeless tobacco: Never  Substance and Sexual Activity   Alcohol use: Not on file   Drug use: Not on file   Sexual activity: Not on file  Other Topics Concern   Not on file  Social History Narrative   Not on file   Social Determinants of Health   Financial Resource Strain: Not on file  Food Insecurity: Not on file  Transportation Needs: Not on file  Physical Activity: Not on file  Stress: Not on file  Social Connections: Not on file     Family History: The patient's family history includes Colon cancer in her mother; Diabetes in her mother; Diabetes type II in her mother; Heart disease in her father; High Cholesterol in her father and mother; High blood pressure in her father and mother; Leukemia in her father; Memory loss in her mother; Obesity in her father and mother; Sleep apnea in her father; Stroke in her father. ROS:   Please see the history of present illness.    All 14 point review of systems negative except as described per history of present illness  EKGs/Labs/Other Studies Reviewed:      Recent Labs: 02/23/2022:  ALT 20; BUN 30; Creatinine, Ser 1.00; Potassium 5.0; Sodium 143  Recent Lipid Panel    Component Value Date/Time   CHOL 155 02/23/2022 0953   TRIG 121 02/23/2022 0953   HDL 47 02/23/2022 0953   LDLCALC 86 02/23/2022 0953    Physical Exam:    VS:  There were no vitals taken for this visit.    Wt Readings from Last 3 Encounters:  05/14/22 299 lb (135.6 kg)  03/25/22 297 lb (134.7 kg)  02/23/22 298 lb (135.2 kg)     GEN:  Well nourished, well developed in no acute distress HEENT: Normal NECK: No JVD; No carotid bruits LYMPHATICS: No lymphadenopathy CARDIAC: RRR, systolic ejection murmur grade 2/6 best heard right upper portion of the sternum, no rubs, no gallops RESPIRATORY:  Clear to auscultation without rales,  wheezing or rhonchi  ABDOMEN: Soft, non-tender, non-distended MUSCULOSKELETAL:  No edema; No deformity  SKIN: Warm and dry LOWER EXTREMITIES: no swelling NEUROLOGIC:  Alert and oriented x 3 PSYCHIATRIC:  Normal affect   ASSESSMENT:    1. Nonrheumatic aortic valve stenosis   2. Stenosis of left carotid artery   3. Hypertension associated with type 2 diabetes mellitus (HCC)   4. Gastroesophageal reflux disease, unspecified whether esophagitis present   5. Mixed hyperlipidemia    PLAN:    In order of problems listed above:  Nonrheumatic aortic valve stenosis only mild based on last echocardiogram.  We will continue monitoring. Carotic artery stenosis, will make arrangements for carotic ultrasound.  Last the test done in 2021 showing 40 to 59% stenosis on the left side we will repeat the test Essential hypertension, uncontrolled.  I will initiate Norvasc 5 mg daily Gastroesophageal reflux disease which I think is responsible for her symptomatology her chest sensation happen at night not related to exercise not related to her emotions. Mixed dyslipidemia I did review K PN which show LDL of 86 HDL 46.  She is already on Crestor 10 which I will double to 20.   Medication Adjustments/Labs and Tests Ordered: Current medicines are reviewed at length with the patient today.  Concerns regarding medicines are outlined above.  No orders of the defined types were placed in this encounter.  Medication changes: No orders of the defined types were placed in this encounter.   Signed, Georgeanna Lea, MD, [facility] 06/05/2022 1:53 PM    [facility]

## 2022-06-05 NOTE — Patient Instructions (Addendum)
Medication Instructions:  Your physician has recommended you make the following change in your medication:   START: Amlodipine 5mg  1 tablet daily by mouth    Lab Work: None Ordered If you have labs (blood work) drawn today and your tests are completely normal, you will receive your results only by: MyChart Message (if you have MyChart) OR A paper copy in the mail If you have any lab test that is abnormal or we need to change your treatment, we will call you to review the results.   Testing/Procedures: Your physician has requested that you have a carotid duplex. This test is an ultrasound of the carotid arteries in your neck. It looks at blood flow through these arteries that supply the brain with blood. Allow one hour for this exam. There are no restrictions or special instructions.    Follow-Up: At [facility], you and your health needs are our priority.  As part of our continuing mission to provide you with exceptional heart care, we have created designated Provider Care Teams.  These Care Teams include your primary Cardiologist (physician) and Advanced Practice Providers (APPs -  Physician Assistants and Nurse Practitioners) who all work together to provide you with the care you need, when you need it.  We recommend signing up for the patient portal called "MyChart".  Sign up information is provided on this After Visit Summary.  MyChart is used to connect with patients for Virtual Visits (Telemedicine).  Patients are able to view lab/test results, encounter notes, upcoming appointments, etc.  Non-urgent messages can be sent to your provider as well.   To learn more about what you can do with MyChart, go to [facility].    Your next appointment:   6 month(s)  The format for your next appointment:   In Person  Provider:   [email or web address], MD    Other Instructions NA

## 2022-06-17 ENCOUNTER — Ambulatory Visit (INDEPENDENT_AMBULATORY_CARE_PROVIDER_SITE_OTHER): Payer: Medicare Other

## 2022-06-17 DIAGNOSIS — I6522 Occlusion and stenosis of left carotid artery: Secondary | ICD-10-CM

## 2022-06-18 ENCOUNTER — Encounter (INDEPENDENT_AMBULATORY_CARE_PROVIDER_SITE_OTHER): Payer: Self-pay | Admitting: Bariatrics

## 2022-06-18 ENCOUNTER — Ambulatory Visit (INDEPENDENT_AMBULATORY_CARE_PROVIDER_SITE_OTHER): Payer: Medicare Other | Admitting: Bariatrics

## 2022-06-18 VITALS — BP 134/68 | HR 68 | Temp 97.3°F | Ht 64.0 in | Wt 297.0 lb

## 2022-06-18 DIAGNOSIS — E669 Obesity, unspecified: Secondary | ICD-10-CM

## 2022-06-18 DIAGNOSIS — E1169 Type 2 diabetes mellitus with other specified complication: Secondary | ICD-10-CM | POA: Diagnosis not present

## 2022-06-18 DIAGNOSIS — I10 Essential (primary) hypertension: Secondary | ICD-10-CM | POA: Diagnosis not present

## 2022-06-18 DIAGNOSIS — Z6841 Body Mass Index (BMI) 40.0 and over, adult: Secondary | ICD-10-CM | POA: Diagnosis not present

## 2022-06-18 DIAGNOSIS — Z7985 Long-term (current) use of injectable non-insulin antidiabetic drugs: Secondary | ICD-10-CM

## 2022-06-18 DIAGNOSIS — Z794 Long term (current) use of insulin: Secondary | ICD-10-CM

## 2022-06-23 ENCOUNTER — Telehealth: Payer: Self-pay

## 2022-06-23 NOTE — Telephone Encounter (Signed)
-----   Message from Georgeanna Lea, MD sent at 06/23/2022 11:07 AM EDT ----- Up to 39% stenosis of both internal carotid arteries, medical therapy

## 2022-06-23 NOTE — Telephone Encounter (Signed)
Patient notified of results.

## 2022-06-24 NOTE — Progress Notes (Unsigned)
     Chief Complaint:   OBESITY Bethany Lynch is here to discuss her progress with her obesity treatment plan along with follow-up of her obesity related diagnoses. Bethany Lynch is on {MWMwtlossportion/plan2:23431} and states she is following her eating plan approximately ***% of the time. Bethany Lynch states she is *** *** minutes *** times per week.  Today's visit was #: *** Starting weight: *** Starting date: *** Today's weight: *** Today's date: 06/18/2022 Total lbs lost to date: *** Total lbs lost since last in-office visit: ***  Interim History: ***  Subjective:   1. Essential hypertension ***  2. Diabetes mellitus type 2 in obese (HCC) ***  Assessment/Plan:   1. Essential hypertension ***  2. Diabetes mellitus type 2 in obese (HCC) ***  3. Obesity, Current BMI 51.1 Bethany Lynch {CHL AMB IS/IS NOT:210130109} currently in the action stage of change. As such, her goal is to {MWMwtloss#1:210800005}. She has agreed to {MWMwtlossportion/plan2:23431}.   Exercise goals: {MWM EXERCISE RECS:23473}  Behavioral modification strategies: {MWMwtlossdietstrategies3:23432}.  Bethany Lynch has agreed to follow-up with our clinic in {NUMBER 1-10:22536} weeks. She was informed of the importance of frequent follow-up visits to maximize her success with intensive lifestyle modifications for her multiple health conditions.   Objective:   Blood pressure 134/68, pulse 68, temperature (!) 97.3 F (36.3 C), height 5\' 4"  (1.626 m), weight 297 lb (134.7 kg), SpO2 97 %. Body mass index is 50.98 kg/m.  General: Cooperative, alert, well developed, in no acute distress. HEENT: Conjunctivae and lids unremarkable. Cardiovascular: Regular rhythm.  Lungs: Normal work of breathing. Neurologic: No focal deficits.   Lab Results  Component Value Date   CREATININE 1.00 02/23/2022   BUN 30 (H) 02/23/2022   NA 143 02/23/2022   K 5.0 02/23/2022   CL 104 02/23/2022   CO2 23 02/23/2022   Lab Results  Component Value Date    ALT 20 02/23/2022   AST 19 02/23/2022   ALKPHOS 75 02/23/2022   BILITOT 0.3 02/23/2022   Lab Results  Component Value Date   HGBA1C 6.2 (H) 02/23/2022   HGBA1C 6.3 (H) 10/09/2021   HGBA1C 6.3 (H) 01/28/2021   Lab Results  Component Value Date   INSULIN 3.5 02/23/2022   INSULIN 3.7 01/28/2021   Lab Results  Component Value Date   TSH 2.830 09/28/2018   Lab Results  Component Value Date   CHOL 155 02/23/2022   HDL 47 02/23/2022   LDLCALC 86 02/23/2022   TRIG 121 02/23/2022   Lab Results  Component Value Date   VD25OH 51.6 02/23/2022   VD25OH 60.3 10/09/2021   VD25OH 54.8 01/28/2021   No results found for: "WBC", "HGB", "HCT", "MCV", "PLT" No results found for: "IRON", "TIBC", "FERRITIN"  Attestation Statements:   Reviewed by clinician on day of visit: allergies, medications, problem list, medical history, surgical history, family history, social history, and previous encounter notes.  03/30/2021, am acting as Trude Mcburney for Energy manager, DO.  I have reviewed the above documentation for accuracy and completeness, and I agree with the above. -  ***

## 2022-06-25 ENCOUNTER — Encounter (INDEPENDENT_AMBULATORY_CARE_PROVIDER_SITE_OTHER): Payer: Self-pay | Admitting: Bariatrics

## 2022-07-08 ENCOUNTER — Encounter (INDEPENDENT_AMBULATORY_CARE_PROVIDER_SITE_OTHER): Payer: Self-pay

## 2022-07-18 ENCOUNTER — Other Ambulatory Visit: Payer: Self-pay | Admitting: Cardiology

## 2022-07-23 ENCOUNTER — Ambulatory Visit (INDEPENDENT_AMBULATORY_CARE_PROVIDER_SITE_OTHER): Payer: Medicare Other | Admitting: Bariatrics

## 2022-07-23 ENCOUNTER — Encounter (INDEPENDENT_AMBULATORY_CARE_PROVIDER_SITE_OTHER): Payer: Self-pay | Admitting: Bariatrics

## 2022-07-23 VITALS — BP 138/71 | HR 63 | Temp 98.1°F | Ht 64.0 in | Wt 303.0 lb

## 2022-07-23 DIAGNOSIS — E782 Mixed hyperlipidemia: Secondary | ICD-10-CM | POA: Diagnosis not present

## 2022-07-23 DIAGNOSIS — K5909 Other constipation: Secondary | ICD-10-CM | POA: Diagnosis not present

## 2022-07-23 DIAGNOSIS — Z6841 Body Mass Index (BMI) 40.0 and over, adult: Secondary | ICD-10-CM

## 2022-07-23 DIAGNOSIS — E1169 Type 2 diabetes mellitus with other specified complication: Secondary | ICD-10-CM | POA: Insufficient documentation

## 2022-07-23 DIAGNOSIS — E669 Obesity, unspecified: Secondary | ICD-10-CM

## 2022-07-28 ENCOUNTER — Encounter (INDEPENDENT_AMBULATORY_CARE_PROVIDER_SITE_OTHER): Payer: Self-pay | Admitting: Bariatrics

## 2022-07-28 NOTE — Progress Notes (Signed)
Chief Complaint:   OBESITY Bethany Lynch is here to discuss her progress with her obesity treatment plan along with follow-up of her obesity related diagnoses. Bethany Lynch is on the Category 2 Plan and states she is following her eating plan approximately 30% of the time. Bethany Lynch states she is has not been exercising.  Today's visit was #: 23 Starting weight: 339 lbs Starting date: 01/28/2021 Today's weight: 303 lbs Today's date: 07/23/22 Total lbs lost to date: 36 Total lbs lost since last in-office visit: +6  Interim History: She is up 6 pounds since her trip to [place].  Subjective:   1. Diabetes mellitus type 2 in obese (HCC) Lows in the a.m.  Taking metformin and NovoLog.  A1c is 6.1.  2. Mixed hyperlipidemia Taking Crestor.  3. Other constipation Increased with protein, but drinking more water.   Assessment/Plan:   1. Diabetes mellitus type 2 in obese (HCC) 1. Talk with PCP about low blood sugars. 2.  Increase protein.  2. Mixed hyperlipidemia Continue medication.    3. Other constipation Keep water and fiber high.  4. Obesity, Current BMI 52.0 1.  Meal planning 2.  Will remain adherent to the plan.  Bethany Lynch is currently in the action stage of change. As such, her goal is to continue with weight loss efforts. She has agreed to the Category 2 Plan.   Exercise goals: Decreased exercise since her trip, going to the [facility].  Behavioral modification strategies: increasing lean protein intake, decreasing simple carbohydrates, increasing vegetables, increasing water intake, decreasing eating out, no skipping meals, meal planning and cooking strategies, keeping healthy foods in the home, and planning for success.  Bethany Lynch has agreed to follow-up with our clinic in 4 weeks. She was informed of the importance of frequent follow-up visits to maximize her success with intensive lifestyle modifications for her multiple health conditions.   Objective:   Blood pressure 138/71, pulse 63,  temperature 98.1 F (36.7 C), height 5\' 4"  (1.626 m), weight (!) 303 lb (137.4 kg), SpO2 98 %. Body mass index is 52.01 kg/m.  General: Cooperative, alert, well developed, in no acute distress. HEENT: Conjunctivae and lids unremarkable. Cardiovascular: Regular rhythm.  Lungs: Normal work of breathing. Neurologic: No focal deficits.   Lab Results  Component Value Date   CREATININE 1.00 02/23/2022   BUN 30 (H) 02/23/2022   NA 143 02/23/2022   K 5.0 02/23/2022   CL 104 02/23/2022   CO2 23 02/23/2022   Lab Results  Component Value Date   ALT 20 02/23/2022   AST 19 02/23/2022   ALKPHOS 75 02/23/2022   BILITOT 0.3 02/23/2022   Lab Results  Component Value Date   HGBA1C 6.2 (H) 02/23/2022   HGBA1C 6.3 (H) 10/09/2021   HGBA1C 6.3 (H) 01/28/2021   Lab Results  Component Value Date   INSULIN 3.5 02/23/2022   INSULIN 3.7 01/28/2021   Lab Results  Component Value Date   TSH 2.830 09/28/2018   Lab Results  Component Value Date   CHOL 155 02/23/2022   HDL 47 02/23/2022   LDLCALC 86 02/23/2022   TRIG 121 02/23/2022   Lab Results  Component Value Date   VD25OH 51.6 02/23/2022   VD25OH 60.3 10/09/2021   VD25OH 54.8 01/28/2021   No results found for: "WBC", "HGB", "HCT", "MCV", "PLT" No results found for: "IRON", "TIBC", "FERRITIN"  Attestation Statements:   Reviewed by clinician on day of visit: allergies, medications, problem list, medical history, surgical history, family history, social history,  and previous encounter notes.  I, Dawn Whitmire, FNP-C, am acting as transcriptionist for Dr. [name].  I have reviewed the above documentation for accuracy and completeness, and I agree with the above. [name], DO

## 2022-08-20 ENCOUNTER — Ambulatory Visit: Payer: Self-pay | Admitting: Bariatrics

## 2022-08-27 ENCOUNTER — Ambulatory Visit (INDEPENDENT_AMBULATORY_CARE_PROVIDER_SITE_OTHER): Payer: Medicare Other | Admitting: Bariatrics

## 2022-08-27 ENCOUNTER — Encounter: Payer: Self-pay | Admitting: Bariatrics

## 2022-08-27 VITALS — BP 121/62 | Ht 64.0 in | Wt 304.0 lb

## 2022-08-27 DIAGNOSIS — Z7985 Long-term (current) use of injectable non-insulin antidiabetic drugs: Secondary | ICD-10-CM

## 2022-08-27 DIAGNOSIS — K224 Dyskinesia of esophagus: Secondary | ICD-10-CM | POA: Insufficient documentation

## 2022-08-27 DIAGNOSIS — I10 Essential (primary) hypertension: Secondary | ICD-10-CM | POA: Diagnosis not present

## 2022-08-27 DIAGNOSIS — Z6841 Body Mass Index (BMI) 40.0 and over, adult: Secondary | ICD-10-CM

## 2022-08-27 DIAGNOSIS — E669 Obesity, unspecified: Secondary | ICD-10-CM

## 2022-08-27 DIAGNOSIS — E1169 Type 2 diabetes mellitus with other specified complication: Secondary | ICD-10-CM

## 2022-08-27 MED ORDER — ONDANSETRON HCL 4 MG PO TABS: 4.0000 mg | ORAL_TABLET | Freq: Three times a day (TID) | ORAL | 1 refills | Status: AC | PRN

## 2022-08-27 MED ORDER — TIRZEPATIDE 2.5 MG/0.5ML ~~LOC~~ SOAJ: 2.5000 mg | SUBCUTANEOUS | 0 refills | Status: DC

## 2022-08-31 ENCOUNTER — Encounter (INDEPENDENT_AMBULATORY_CARE_PROVIDER_SITE_OTHER): Payer: Self-pay

## 2022-08-31 ENCOUNTER — Telehealth (INDEPENDENT_AMBULATORY_CARE_PROVIDER_SITE_OTHER): Payer: Self-pay | Admitting: Bariatrics

## 2022-08-31 LAB — HM MAMMOGRAPHY

## 2022-08-31 NOTE — Telephone Encounter (Signed)
Dr. Owens Shark - Prior authorization denied for [facility]. Per insurance: The preferred drugs for your plan are: Ozempic, Rybelsus, Trulicity, Victoza (Requirement: 3 in a class with 3 or more alternatives, 2 in a class with 2 alternatives, or 1 in a class with only 1 alternative.) Patient sent denial message via mychart.

## 2022-08-31 NOTE — Progress Notes (Signed)
Chief Complaint:   OBESITY Bethany Lynch is here to discuss her progress with her obesity treatment plan along with follow-up of her obesity related diagnoses. Bethany Lynch is on following a lower carbohydrate, vegetable and lean protein rich diet plan and states she is following her eating plan approximately 50% of the time. Bethany Lynch states she is water aerobics for 45 minutes 1 time per week.  Today's visit was #: 24 Starting weight: 339 lbs Starting date: 01/28/2021 Today's weight: 304 lbs Today's date: 08/27/2022 Total lbs lost to date: 35 Total lbs lost since last in-office visit: 0  Interim History: Bethany Lynch is up 1 lb since her last visit. She is on a plateau.   Subjective:   1. Diabetes mellitus type 2 in obese Bethany Lynch) Bethany Lynch is seeing Bethany Lynch at [facility]. She notes lows in the morning (70's), and now 110-112. Last A1c was 6.2. She is taking metformin (sliding scale) Tresiba 80 units in morning. She denies contraindications.   2. Essential hypertension Bethany Lynch taking Norvasc, Lasix, and Apresoline. She notes occasional low blood pressures.   Assessment/Plan:   1. Diabetes mellitus type 2 in obese (HCC) Bethany Lynch will decrease Tresiba from 80 units to 75 units, and she agreed to start Mounjaro 2.5 mg once weekly with no refills; zofran 4 mg every 8 hours as needed, with no refills. He is to cut his carbohydrates.   - tirzepatide [facility]) 2.5 MG/0.5ML Pen; Inject 2.5 mg into the skin once a week.  Dispense: 2 mL; Refill: 0 - ondansetron (ZOFRAN) 4 MG tablet; Take 1 tablet (4 mg total) by mouth every 8 (eight) hours as needed for nausea or vomiting.  Dispense: 20 tablet; Refill: 1  2. Essential hypertension Bethany Lynch will continue her medications, and she will check her blood pressures at home.   3. Obesity, Current BMI 52.2 Bethany Lynch is currently in the action stage of change. As such, her goal is to continue with weight loss efforts. She has agreed to following a lower  carbohydrate, vegetable and lean protein rich diet plan.   Reviewed labs with the patient today from 06/11/2022, CMP, lipid, and A1c.  Exercise goals: As is.   Behavioral modification strategies: increasing lean protein intake, decreasing simple carbohydrates, increasing vegetables, increasing water intake, decreasing eating out, no skipping meals, meal planning and cooking strategies, keeping healthy foods in the home, and planning for success.  Bethany Lynch has agreed to follow-up with our clinic in 2 to 3 weeks. She was informed of the importance of frequent follow-up visits to maximize her success with intensive lifestyle modifications for her multiple health conditions.   Objective:   Blood pressure 121/62, height 5\' 4"  (1.626 m), weight (!) 304 lb (137.9 kg). Body mass index is 52.18 kg/m.  General: Cooperative, alert, well developed, in no acute distress. HEENT: Conjunctivae and lids unremarkable. Cardiovascular: Regular rhythm.  Lungs: Normal work of breathing. Neurologic: No focal deficits.   Lab Results  Component Value Date   CREATININE 1.00 02/23/2022   BUN 30 (H) 02/23/2022   NA 143 02/23/2022   K 5.0 02/23/2022   CL 104 02/23/2022   CO2 23 02/23/2022   Lab Results  Component Value Date   ALT 20 02/23/2022   AST 19 02/23/2022   ALKPHOS 75 02/23/2022   BILITOT 0.3 02/23/2022   Lab Results  Component Value Date   HGBA1C 6.2 (H) 02/23/2022   HGBA1C 6.3 (H) 10/09/2021   HGBA1C 6.3 (H) 01/28/2021   Lab Results  Component Value  Date   INSULIN 3.5 02/23/2022   INSULIN 3.7 01/28/2021   Lab Results  Component Value Date   TSH 2.830 09/28/2018   Lab Results  Component Value Date   CHOL 155 02/23/2022   HDL 47 02/23/2022   LDLCALC 86 02/23/2022   TRIG 121 02/23/2022   Lab Results  Component Value Date   VD25OH 51.6 02/23/2022   VD25OH 60.3 10/09/2021   VD25OH 54.8 01/28/2021   No results found for: "WBC", "HGB", "HCT", "MCV", "PLT" No results found for:  "IRON", "TIBC", "FERRITIN"  Attestation Statements:   Reviewed by clinician on day of visit: allergies, medications, problem list, medical history, surgical history, family history, social history, and previous encounter notes.    Trude Mcburney, am acting as Energy manager for [employer], DO.  I have reviewed the above documentation for accuracy and completeness, and I agree with the above. [name], DO

## 2022-09-01 ENCOUNTER — Encounter: Payer: Self-pay | Admitting: Bariatrics

## 2022-09-17 ENCOUNTER — Ambulatory Visit (INDEPENDENT_AMBULATORY_CARE_PROVIDER_SITE_OTHER): Payer: Medicare Other | Admitting: Bariatrics

## 2022-09-17 ENCOUNTER — Encounter: Payer: Self-pay | Admitting: Bariatrics

## 2022-09-17 VITALS — BP 144/62 | HR 56 | Temp 97.5°F | Ht 64.0 in | Wt 301.0 lb

## 2022-09-17 DIAGNOSIS — Z6841 Body Mass Index (BMI) 40.0 and over, adult: Secondary | ICD-10-CM

## 2022-09-17 DIAGNOSIS — R5383 Other fatigue: Secondary | ICD-10-CM | POA: Diagnosis not present

## 2022-09-17 DIAGNOSIS — E1169 Type 2 diabetes mellitus with other specified complication: Secondary | ICD-10-CM | POA: Diagnosis not present

## 2022-09-17 DIAGNOSIS — Z862 Personal history of diseases of the blood and blood-forming organs and certain disorders involving the immune mechanism: Secondary | ICD-10-CM | POA: Insufficient documentation

## 2022-09-17 DIAGNOSIS — E669 Obesity, unspecified: Secondary | ICD-10-CM

## 2022-09-17 DIAGNOSIS — E559 Vitamin D deficiency, unspecified: Secondary | ICD-10-CM

## 2022-09-17 DIAGNOSIS — E782 Mixed hyperlipidemia: Secondary | ICD-10-CM | POA: Diagnosis not present

## 2022-09-17 DIAGNOSIS — Z7985 Long-term (current) use of injectable non-insulin antidiabetic drugs: Secondary | ICD-10-CM

## 2022-09-18 LAB — CBC WITH DIFFERENTIAL/PLATELET
Basophils Absolute: 0.1 10*3/uL (ref 0.0–0.2)
Basos: 1 %
EOS (ABSOLUTE): 0.2 10*3/uL (ref 0.0–0.4)
Eos: 2 %
Hematocrit: 43 % (ref 34.0–46.6)
Hemoglobin: 14.3 g/dL (ref 11.1–15.9)
Immature Grans (Abs): 0 10*3/uL (ref 0.0–0.1)
Immature Granulocytes: 0 %
Lymphocytes Absolute: 2.7 10*3/uL (ref 0.7–3.1)
Lymphs: 31 %
MCH: 29 pg (ref 26.6–33.0)
MCHC: 33.3 g/dL (ref 31.5–35.7)
MCV: 87 fL (ref 79–97)
Monocytes Absolute: 0.9 10*3/uL (ref 0.1–0.9)
Monocytes: 10 %
Neutrophils Absolute: 4.8 10*3/uL (ref 1.4–7.0)
Neutrophils: 56 %
Platelets: 324 10*3/uL (ref 150–450)
RBC: 4.93 x10E6/uL (ref 3.77–5.28)
RDW: 12.9 % (ref 11.7–15.4)
WBC: 8.8 10*3/uL (ref 3.4–10.8)

## 2022-09-18 LAB — TSH+T4F+T3FREE
Free T4: 1.25 ng/dL (ref 0.82–1.77)
T3, Free: 2.4 pg/mL (ref 2.0–4.4)
TSH: 3.06 u[IU]/mL (ref 0.450–4.500)

## 2022-09-18 LAB — VITAMIN B12: Vitamin B-12: 1162 pg/mL (ref 232–1245)

## 2022-09-18 LAB — VITAMIN D 25 HYDROXY (VIT D DEFICIENCY, FRACTURES): Vit D, 25-Hydroxy: 46.3 ng/mL (ref 30.0–100.0)

## 2022-09-18 LAB — FERRITIN: Ferritin: 27 ng/mL (ref 15–150)

## 2022-09-27 NOTE — Progress Notes (Signed)
Chief Complaint:   OBESITY Bethany Lynch is here to discuss her progress with her obesity treatment plan along with follow-up of her obesity related diagnoses. Bethany Lynch is on following a lower carbohydrate, vegetable and lean protein rich diet plan and states she is following her eating plan approximately 50% of the time. Bethany Lynch states she is doing water aerobics for 60 minutes 1-2 times per week.  Today's visit was #: 25 Starting weight: 339 lbs Starting date: 01/28/2021 Today's weight: 301 lbs Today's date: 09/17/2022 Total lbs lost to date: 38 Total lbs lost since last in-office visit: 3  Interim History: Bethany Lynch is down 3 pounds since her last visit.  She was put on the low carbohydrate plan at her last visit and she had been on category 2.  Subjective:   1. Diabetes mellitus type 2 in obese (Bethany Lynch) Bethany Lynch's fasting blood sugar is 110.  She was prescribed Mounjaro at her last visit, but she has not taken it yet.  2. Mixed hyperlipidemia Bethany Lynch taking Crestor.   3. Other fatigue Bethany Lynch notes fatigue and decreased energy.   4. Vitamin D deficiency Bethany Lynch is taking Vitamin D.   5. History of iron deficiency anemia Bethany Lynch is not on iron supplementation.   Assessment/Plan:   1. Diabetes mellitus type 2 in obese Bethany Lynch. [facility]) We will check labs today. Jaleah will continue her medications, and keep her carbohydrates low.   - Vitamin B12  2. Mixed hyperlipidemia Bethany Lynch will continue her medications. Handout on healthy versus unhealthy fats was given.   3. Other fatigue We will check labs today, and we will follow-up at [place] next visit.   - Vitamin B12 - VITAMIN D 25 Hydroxy (Vit-D Deficiency, Fractures) - CBC with Differential/Platelet - TSH+T4F+T3Free - Ferritin  4. Vitamin D deficiency We will check labs today. Bethany Lynch will follow-up for routine testing of Vitamin D, at least 2-3 times per year to avoid over-replacement.  - VITAMIN D 25 Hydroxy (Vit-D Deficiency,  Fractures)  5. History of iron deficiency anemia We will check labs today, and we will follow-up at [place] next visit.   - CBC with Differential/Platelet - Ferritin  6. Obesity, Current BMI 51.7 Bethany Lynch is currently in the action stage of change. As such, her goal is to continue with weight loss efforts. She has agreed to following a lower carbohydrate, vegetable and lean protein rich diet plan with fruit options.   Bethany Lynch will adhere closely to the plan.  She is to decrease her portion sizes and increase her water intake.  Exercise goals: All adults should avoid inactivity. Some physical activity is better than none, and adults who participate in any amount of physical activity gain some health benefits.  Behavioral modification strategies: increasing lean protein intake, decreasing simple carbohydrates, increasing vegetables, increasing water intake, decreasing eating out, no skipping meals, meal planning and cooking strategies, keeping healthy foods in the home, and planning for success.  Bethany Lynch has agreed to follow-up with our clinic in 4 weeks. She was informed of the importance of frequent follow-up visits to maximize her success with intensive lifestyle modifications for her multiple health conditions.   Bethany Lynch was informed we would discuss her lab results at her next visit unless there is a critical issue that needs to be addressed sooner. Bethany Lynch agreed to keep her next visit at the agreed upon time to discuss these results.  Objective:   Blood pressure (!) 144/62, pulse (!) 56, temperature (!) 97.5 F (36.4 C), height 5\' 4"  (1.626 m), weight [facility])  301 lb (136.5 kg), SpO2 99 %. Body mass index is 51.67 kg/m.  General: Cooperative, alert, well developed, in no acute distress. HEENT: Conjunctivae and lids unremarkable. Cardiovascular: Regular rhythm.  Lungs: Normal work of breathing. Neurologic: No focal deficits.   Lab Results  Component Value Date   CREATININE 1.00  02/23/2022   BUN 30 (H) 02/23/2022   NA 143 02/23/2022   K 5.0 02/23/2022   CL 104 02/23/2022   CO2 23 02/23/2022   Lab Results  Component Value Date   ALT 20 02/23/2022   AST 19 02/23/2022   ALKPHOS 75 02/23/2022   BILITOT 0.3 02/23/2022   Lab Results  Component Value Date   HGBA1C 6.2 (H) 02/23/2022   HGBA1C 6.3 (H) 10/09/2021   HGBA1C 6.3 (H) 01/28/2021   Lab Results  Component Value Date   INSULIN 3.5 02/23/2022   INSULIN 3.7 01/28/2021   Lab Results  Component Value Date   TSH 3.060 09/17/2022   Lab Results  Component Value Date   CHOL 155 02/23/2022   HDL 47 02/23/2022   LDLCALC 86 02/23/2022   TRIG 121 02/23/2022   Lab Results  Component Value Date   VD25OH 46.3 09/17/2022   VD25OH 51.6 02/23/2022   VD25OH 60.3 10/09/2021   Lab Results  Component Value Date   WBC 8.8 09/17/2022   HGB 14.3 09/17/2022   HCT 43.0 09/17/2022   MCV 87 09/17/2022   PLT 324 09/17/2022   Lab Results  Component Value Date   FERRITIN 27 09/17/2022   Attestation Statements:   Reviewed by clinician on day of visit: allergies, medications, problem list, medical history, surgical history, family history, social history, and previous encounter notes.   Trude Mcburney, am acting as Energy manager for [employer], DO.  I have reviewed the above documentation for accuracy and completeness, and I agree with the above. [name], DO

## 2022-10-08 ENCOUNTER — Encounter: Payer: Self-pay | Admitting: Bariatrics

## 2022-10-15 ENCOUNTER — Ambulatory Visit (INDEPENDENT_AMBULATORY_CARE_PROVIDER_SITE_OTHER): Payer: Medicare Other | Admitting: Bariatrics

## 2022-10-15 ENCOUNTER — Encounter: Payer: Self-pay | Admitting: Bariatrics

## 2022-10-15 VITALS — BP 153/74 | HR 53 | Temp 97.6°F | Ht 64.0 in | Wt 301.0 lb

## 2022-10-15 DIAGNOSIS — Z6841 Body Mass Index (BMI) 40.0 and over, adult: Secondary | ICD-10-CM | POA: Diagnosis not present

## 2022-10-15 DIAGNOSIS — E1169 Type 2 diabetes mellitus with other specified complication: Secondary | ICD-10-CM

## 2022-10-15 DIAGNOSIS — E669 Obesity, unspecified: Secondary | ICD-10-CM | POA: Diagnosis not present

## 2022-10-15 DIAGNOSIS — F5089 Other specified eating disorder: Secondary | ICD-10-CM

## 2022-10-15 DIAGNOSIS — R5383 Other fatigue: Secondary | ICD-10-CM

## 2022-10-15 MED ORDER — BUPROPION HCL ER (SR) 150 MG PO TB12: 150.0000 mg | ORAL_TABLET | Freq: Every day | ORAL | 0 refills | Status: DC

## 2022-10-28 ENCOUNTER — Encounter: Payer: Self-pay | Admitting: Bariatrics

## 2022-10-28 NOTE — Progress Notes (Signed)
Chief Complaint:   OBESITY Bethany Lynch is here to discuss her progress with her obesity treatment plan along with follow-up of her obesity related diagnoses. Bethany Lynch is on following a lower carbohydrate, vegetable and lean protein rich diet plan and states she is following her eating plan approximately 75-80% of the time. Bethany Lynch states she is doing water aerobics for 60 minutes 2 times per week.  Today's visit was #: 26 Starting weight: 339 lbs Starting date: 01/28/2021 Today's weight: 301 Today's date: 10/15/2022 Total lbs lost to date: 38 Total lbs lost since last in-office visit: 0  Interim History: Bethany Lynch's weight remains the same. She is doing better with her water and decreasing soft drinks.   Subjective:   1. Diabetes mellitus type 2 in obese [facility]) Bethany Lynch was prescribed Mounjaro but she is not taking.   2. Other disorder of eating Bethany Lynch notes emotional eating and boredom eating. She denies contraindication.   Assessment/Plan:   1. Diabetes mellitus type 2 in obese [facility]) Bethany Lynch will keep all carbohydrates low, and increase her fiber intake.   2. Other disorder of eating Bethany Lynch agreed to start Wellbutrin SR 150 mg once daily with no refills. Emotional eating handout was given.   - buPROPion (WELLBUTRIN SR) 150 MG 12 hr tablet; Take 1 tablet (150 mg total) by mouth daily.  Dispense: 30 tablet; Refill: 0  3. Obesity, Current BMI 51.8 Bethany Lynch is currently in the action stage of change. As such, her goal is to continue with weight loss efforts. She has agreed to following a lower carbohydrate, vegetable and lean protein rich diet plan.   Meal planning was discussed. Reviewed labs with the patient from 09/07/2022, ferritin, Vit D, B12, CBC, TSH, and thyroid panel.   Exercise goals: As is.   Behavioral modification strategies: increasing lean protein intake, decreasing simple carbohydrates, increasing vegetables, increasing water intake, decreasing eating out, no skipping meals,  meal planning and cooking strategies, keeping healthy foods in the home, and planning for success.  Bethany Lynch has agreed to follow-up with our clinic in 4 weeks. She was informed of the importance of frequent follow-up visits to maximize her success with intensive lifestyle modifications for her multiple health conditions.   Objective:   Blood pressure (!) 153/74, pulse (!) 53, temperature 97.6 F (36.4 C), height 5\' 4"  (1.626 m), weight (!) 301 lb (136.5 kg), SpO2 98 %. Body mass index is 51.67 kg/m.  General: Cooperative, alert, well developed, in no acute distress. HEENT: Conjunctivae and lids unremarkable. Cardiovascular: Regular rhythm.  Lungs: Normal work of breathing. Neurologic: No focal deficits.   Lab Results  Component Value Date   CREATININE 1.00 02/23/2022   BUN 30 (H) 02/23/2022   NA 143 02/23/2022   K 5.0 02/23/2022   CL 104 02/23/2022   CO2 23 02/23/2022   Lab Results  Component Value Date   ALT 20 02/23/2022   AST 19 02/23/2022   ALKPHOS 75 02/23/2022   BILITOT 0.3 02/23/2022   Lab Results  Component Value Date   HGBA1C 6.2 (H) 02/23/2022   HGBA1C 6.3 (H) 10/09/2021   HGBA1C 6.3 (H) 01/28/2021   Lab Results  Component Value Date   INSULIN 3.5 02/23/2022   INSULIN 3.7 01/28/2021   Lab Results  Component Value Date   TSH 3.060 09/17/2022   Lab Results  Component Value Date   CHOL 155 02/23/2022   HDL 47 02/23/2022   LDLCALC 86 02/23/2022   TRIG 121 02/23/2022   Lab Results  Component Value Date   VD25OH 46.3 09/17/2022   VD25OH 51.6 02/23/2022   VD25OH 60.3 10/09/2021   Lab Results  Component Value Date   WBC 8.8 09/17/2022   HGB 14.3 09/17/2022   HCT 43.0 09/17/2022   MCV 87 09/17/2022   PLT 324 09/17/2022   Lab Results  Component Value Date   FERRITIN 27 09/17/2022   Attestation Statements:   Reviewed by clinician on day of visit: allergies, medications, problem list, medical history, surgical history, family history, social  history, and previous encounter notes.   Trude Mcburney, am acting as Energy manager for [employer], DO.  I have reviewed the above documentation for accuracy and completeness, and I agree with the above. [name], DO

## 2022-11-07 ENCOUNTER — Other Ambulatory Visit: Payer: Self-pay | Admitting: Bariatrics

## 2022-11-07 DIAGNOSIS — F5089 Other specified eating disorder: Secondary | ICD-10-CM

## 2022-11-11 ENCOUNTER — Other Ambulatory Visit: Payer: Self-pay | Admitting: Bariatrics

## 2022-11-11 DIAGNOSIS — F5089 Other specified eating disorder: Secondary | ICD-10-CM

## 2022-11-17 ENCOUNTER — Encounter: Payer: Self-pay | Admitting: Bariatrics

## 2022-11-17 ENCOUNTER — Ambulatory Visit (INDEPENDENT_AMBULATORY_CARE_PROVIDER_SITE_OTHER): Payer: Medicare Other | Admitting: Bariatrics

## 2022-11-17 VITALS — BP 148/63 | HR 53 | Temp 97.7°F | Ht 64.0 in | Wt 303.0 lb

## 2022-11-17 DIAGNOSIS — F5089 Other specified eating disorder: Secondary | ICD-10-CM | POA: Diagnosis not present

## 2022-11-17 DIAGNOSIS — E1169 Type 2 diabetes mellitus with other specified complication: Secondary | ICD-10-CM | POA: Diagnosis not present

## 2022-11-17 DIAGNOSIS — Z6841 Body Mass Index (BMI) 40.0 and over, adult: Secondary | ICD-10-CM

## 2022-11-17 DIAGNOSIS — Z7984 Long term (current) use of oral hypoglycemic drugs: Secondary | ICD-10-CM

## 2022-11-17 DIAGNOSIS — E669 Obesity, unspecified: Secondary | ICD-10-CM

## 2022-11-17 DIAGNOSIS — F509 Eating disorder, unspecified: Secondary | ICD-10-CM | POA: Insufficient documentation

## 2022-11-17 MED ORDER — BUPROPION HCL ER (SR) 150 MG PO TB12: 150.0000 mg | ORAL_TABLET | Freq: Every day | ORAL | 0 refills | Status: DC

## 2022-12-03 NOTE — Patient Instructions (Signed)

## 2022-12-08 ENCOUNTER — Encounter: Payer: Self-pay | Admitting: Bariatrics

## 2022-12-08 NOTE — Progress Notes (Signed)
Chief Complaint:   OBESITY Bethany Lynch is here to discuss her progress with her obesity treatment plan along with follow-up of her obesity related diagnoses. [name] is on following a lower carbohydrate, vegetable and lean protein rich diet plan and states she is following her eating plan approximately 65-70% of the time. Bethany Lynch states she is doing water aerobics for 45 minutes 1 time per week.  Today's visit was #: 66 Starting weight: 339 lbs Starting date: 01/28/2021 Today's weight: 303 lbs Today's date: 11/17/2022 Total lbs lost to date: 36 Total lbs lost since last in-office visit: 0  Interim History: Bethany Lynch notes the Wellbutrin is helping with her cravings.  She is up 2 pounds since her last visit.  Subjective:   1. Diabetes mellitus type 2 in obese [name]) [name] is taking metformin, and she is unable to tolerate GLP-1.  Her last A1c was 6.2.  2. Other disorder of eating Bethany Lynch notes Wellbutrin helps with emotional eating.  Assessment/Plan:   1. Diabetes mellitus type 2 in obese [name]) [place] will continue her medications, and she will continue using the [place] 3.  She will continue for glucose monitoring.  2. Other disorder of eating We will refill Wellbutrin SR 150 mg once daily for 1 month.  - buPROPion (WELLBUTRIN SR) 150 MG 12 hr tablet; Take 1 tablet (150 mg total) by mouth daily.  Dispense: 30 tablet; Refill: 0  3. Obesity, Current BMI 52.0 Bethany Lynch is currently in the action stage of change. As such, her goal is to continue with weight loss efforts. She has agreed to following a lower carbohydrate, vegetable and lean protein rich diet plan.   Meal planning and intentional eating were discussed.   Exercise goals: As is.   Behavioral modification strategies: increasing lean protein intake, decreasing simple carbohydrates, increasing vegetables, increasing water intake, decreasing eating out, no skipping meals, meal planning and cooking strategies, keeping healthy foods  in the home, and planning for success.  Bethany Lynch has agreed to follow-up with our clinic in 4 weeks. She was informed of the importance of frequent follow-up visits to maximize her success with intensive lifestyle modifications for her multiple health conditions.   Objective:   Blood pressure (!) 148/63, pulse (!) 53, temperature 97.7 F (36.5 C), height 5\' 4"  (1.626 m), weight (!) 303 lb (137.4 kg), SpO2 97 %. Body mass index is 52.01 kg/m.  General: Cooperative, alert, well developed, in no acute distress. HEENT: Conjunctivae and lids unremarkable. Cardiovascular: Regular rhythm.  Lungs: Normal work of breathing. Neurologic: No focal deficits.   Lab Results  Component Value Date   CREATININE 1.00 02/23/2022   BUN 30 (H) 02/23/2022   NA 143 02/23/2022   K 5.0 02/23/2022   CL 104 02/23/2022   CO2 23 02/23/2022   Lab Results  Component Value Date   ALT 20 02/23/2022   AST 19 02/23/2022   ALKPHOS 75 02/23/2022   BILITOT 0.3 02/23/2022   Lab Results  Component Value Date   HGBA1C 6.2 (H) 02/23/2022   HGBA1C 6.3 (H) 10/09/2021   HGBA1C 6.3 (H) 01/28/2021   Lab Results  Component Value Date   INSULIN 3.5 02/23/2022   INSULIN 3.7 01/28/2021   Lab Results  Component Value Date   TSH 3.060 09/17/2022   Lab Results  Component Value Date   CHOL 155 02/23/2022   HDL 47 02/23/2022   LDLCALC 86 02/23/2022   TRIG 121 02/23/2022   Lab Results  Component Value Date   VD25OH  46.3 09/17/2022   VD25OH 51.6 02/23/2022   VD25OH 60.3 10/09/2021   Lab Results  Component Value Date   WBC 8.8 09/17/2022   HGB 14.3 09/17/2022   HCT 43.0 09/17/2022   MCV 87 09/17/2022   PLT 324 09/17/2022   Lab Results  Component Value Date   FERRITIN 27 09/17/2022   Attestation Statements:   Reviewed by clinician on day of visit: allergies, medications, problem list, medical history, surgical history, family history, social history, and previous encounter notes.   Trude Mcburney,  am acting as Energy manager for [employer], DO.  I have reviewed the above documentation for accuracy and completeness, and I agree with the above. [name], DO

## 2022-12-11 ENCOUNTER — Other Ambulatory Visit: Payer: Self-pay | Admitting: Bariatrics

## 2022-12-11 DIAGNOSIS — F5089 Other specified eating disorder: Secondary | ICD-10-CM

## 2022-12-17 ENCOUNTER — Ambulatory Visit (INDEPENDENT_AMBULATORY_CARE_PROVIDER_SITE_OTHER): Payer: Medicare Other | Admitting: Bariatrics

## 2022-12-17 ENCOUNTER — Encounter: Payer: Self-pay | Admitting: Bariatrics

## 2022-12-17 VITALS — BP 144/62 | HR 54 | Temp 97.7°F | Ht 64.0 in | Wt 299.0 lb

## 2022-12-17 DIAGNOSIS — E1169 Type 2 diabetes mellitus with other specified complication: Secondary | ICD-10-CM

## 2022-12-17 DIAGNOSIS — F5089 Other specified eating disorder: Secondary | ICD-10-CM | POA: Diagnosis not present

## 2022-12-17 DIAGNOSIS — Z6841 Body Mass Index (BMI) 40.0 and over, adult: Secondary | ICD-10-CM | POA: Diagnosis not present

## 2022-12-17 DIAGNOSIS — E669 Obesity, unspecified: Secondary | ICD-10-CM | POA: Diagnosis not present

## 2022-12-17 DIAGNOSIS — Z794 Long term (current) use of insulin: Secondary | ICD-10-CM

## 2022-12-17 DIAGNOSIS — Z7984 Long term (current) use of oral hypoglycemic drugs: Secondary | ICD-10-CM

## 2022-12-17 MED ORDER — BUPROPION HCL ER (SR) 150 MG PO TB12: 150.0000 mg | ORAL_TABLET | Freq: Every day | ORAL | 0 refills | Status: DC

## 2022-12-28 NOTE — Progress Notes (Signed)
Chief Complaint:   OBESITY Bethany Lynch is here to discuss her progress with her obesity treatment plan along with follow-up of her obesity related diagnoses. Bethany Lynch is on following a lower carbohydrate, vegetable and lean protein rich diet plan and states she is following her eating plan approximately 75% of the time. Bethany Lynch states she is at the [facility] for 45 minutes 1 time per week.  Today's visit was #: 28 Starting weight: 339 lbs Starting date: 01/28/2021 Today's weight: 299 lbs Today's date: 12/17/2022 Total lbs lost to date: 40 Total lbs lost since last in-office visit: 4  Interim History: Bethany Lynch is down 4 lbs since her last visit.   Subjective:   1. Diabetes mellitus type 2 in obese [facility]) Bethany Lynch is taking [place], metformin, insulin Tresiba.   2. Other disorder of eating Bethany Lynch is taking Wellbutrin.   Assessment/Plan:   1. Diabetes mellitus type 2 in obese [facility]) Bethany Lynch will continue her medications, and she will keep all carbohydrates low. She will continue to use her continuous glucose monitor, and will change insulin (66 units of Tresiba to 60 units). AM band on low blood sugars.   2. Other disorder of eating Bethany Lynch will continue Wellbutrin SR, and we will refill for 1 month.   - buPROPion (WELLBUTRIN SR) 150 MG 12 hr tablet; Take 1 tablet (150 mg total) by mouth daily.  Dispense: 30 tablet; Refill: 0  3. Obesity, Current BMI 51.3 Bethany Lynch is currently in the action stage of change. As such, her goal is to continue with weight loss efforts. She has agreed to following a lower carbohydrate, vegetable and lean protein rich diet plan.   Meal planning was discussed. She will continue to adhere closely to the plan 80-90%.   Exercise goals: As is.   Behavioral modification strategies: increasing lean protein intake, decreasing simple carbohydrates, increasing vegetables, increasing water intake, decreasing eating out, no skipping meals, meal planning and cooking strategies,  keeping healthy foods in the home, and planning for success.  Gray has agreed to follow-up with our clinic in 4 weeks. She was informed of the importance of frequent follow-up visits to maximize her success with intensive lifestyle modifications for her multiple health conditions.   Objective:   Blood pressure (!) 144/62, pulse (!) 54, temperature 97.7 F (36.5 C), height 5\' 4"  (1.626 m), SpO2 98 %. Body mass index is 52.01 kg/m.  General: Cooperative, alert, well developed, in no acute distress. HEENT: Conjunctivae and lids unremarkable. Cardiovascular: Regular rhythm.  Lungs: Normal work of breathing. Neurologic: No focal deficits.   Lab Results  Component Value Date   CREATININE 1.00 02/23/2022   BUN 30 (H) 02/23/2022   NA 143 02/23/2022   K 5.0 02/23/2022   CL 104 02/23/2022   CO2 23 02/23/2022   Lab Results  Component Value Date   ALT 20 02/23/2022   AST 19 02/23/2022   ALKPHOS 75 02/23/2022   BILITOT 0.3 02/23/2022   Lab Results  Component Value Date   HGBA1C 6.2 (H) 02/23/2022   HGBA1C 6.3 (H) 10/09/2021   HGBA1C 6.3 (H) 01/28/2021   Lab Results  Component Value Date   INSULIN 3.5 02/23/2022   INSULIN 3.7 01/28/2021   Lab Results  Component Value Date   TSH 3.060 09/17/2022   Lab Results  Component Value Date   CHOL 155 02/23/2022   HDL 47 02/23/2022   LDLCALC 86 02/23/2022   TRIG 121 02/23/2022   Lab Results  Component Value Date  VD25OH 46.3 09/17/2022   VD25OH 51.6 02/23/2022   VD25OH 60.3 10/09/2021   Lab Results  Component Value Date   WBC 8.8 09/17/2022   HGB 14.3 09/17/2022   HCT 43.0 09/17/2022   MCV 87 09/17/2022   PLT 324 09/17/2022   Lab Results  Component Value Date   FERRITIN 27 09/17/2022   Attestation Statements:   Reviewed by clinician on day of visit: allergies, medications, problem list, medical history, surgical history, family history, social history, and previous encounter notes.   Wilhemena Durie, am acting  as Location manager for [employer], DO.  I have reviewed the above documentation for accuracy and completeness, and I agree with the above. Jearld Lesch, DO

## 2023-01-09 ENCOUNTER — Other Ambulatory Visit: Payer: Self-pay | Admitting: Bariatrics

## 2023-01-09 DIAGNOSIS — F5089 Other specified eating disorder: Secondary | ICD-10-CM

## 2023-01-13 ENCOUNTER — Encounter: Payer: Self-pay | Admitting: Bariatrics

## 2023-01-13 ENCOUNTER — Ambulatory Visit (INDEPENDENT_AMBULATORY_CARE_PROVIDER_SITE_OTHER): Payer: Medicare Other | Admitting: Bariatrics

## 2023-01-13 VITALS — BP 162/68 | HR 48 | Temp 97.7°F | Ht 64.0 in | Wt 294.0 lb

## 2023-01-13 DIAGNOSIS — E782 Mixed hyperlipidemia: Secondary | ICD-10-CM

## 2023-01-13 DIAGNOSIS — Z6841 Body Mass Index (BMI) 40.0 and over, adult: Secondary | ICD-10-CM | POA: Diagnosis not present

## 2023-01-13 DIAGNOSIS — F5089 Other specified eating disorder: Secondary | ICD-10-CM | POA: Diagnosis not present

## 2023-01-13 MED ORDER — BUPROPION HCL ER (SR) 150 MG PO TB12: 150.0000 mg | ORAL_TABLET | Freq: Every day | ORAL | 0 refills | Status: DC

## 2023-01-27 NOTE — Progress Notes (Signed)
Chief Complaint:   OBESITY Bethany Lynch is here to discuss her progress with her obesity treatment plan along with follow-up of her obesity related diagnoses. Bethany Lynch is on following a lower carbohydrate, vegetable and lean protein rich diet plan and states she is following her eating plan approximately 75% of the time. Bethany Lynch states she is not currently exercising.  Today's visit was #: 61 Starting weight: 339 lbs Starting date: 01/28/2021 Today's weight: 294 lbs Today's date: 01/13/2023 Total lbs lost to date: 45 Total lbs lost since last in-office visit: 5  Interim History: Bethany Lynch is down another 5 lbs since her last visit. Her labs are up-to-date. She had a stomach bug over her vacation.  Subjective:   1. Other disorder of eating Bg is taking Wellbutrin as directed.  2. Mixed hyperlipidemia Dannielynn is taking Crestor.  Assessment/Plan:   1. Other disorder of eating Continue Wellbutrin 150 mg as directed.  Refill- buPROPion (WELLBUTRIN SR) 150 MG 12 hr tablet; Take 1 tablet (150 mg total) by mouth daily.  Dispense: 30 tablet; Refill: 0  2. Mixed hyperlipidemia Continue medication as prescribed. No trans fats.  3. Morbid obesity ([place]) 4. BMI 50.0-59.9, adult [facility]) Meal planning Will adhere to the plan closely  Bethany Lynch is currently in the action stage of change. As such, her goal is to continue with weight loss efforts. She has agreed to the Category 2 Plan and following a lower carbohydrate, vegetable and lean protein rich diet plan.   Exercise goals:  As is  Behavioral modification strategies: increasing lean protein intake, decreasing simple carbohydrates, increasing vegetables, increasing water intake, decreasing eating out, no skipping meals, meal planning and cooking strategies, keeping healthy foods in the home, and planning for success.  Bethany Lynch has agreed to follow-up with our clinic in 4 weeks. She was informed of the importance of frequent follow-up visits to  maximize her success with intensive lifestyle modifications for her multiple health conditions.   Objective:   Blood pressure (!) 162/68, pulse (!) 48, temperature 97.7 F (36.5 C), height '5\' 4"'$  (1.626 m), weight 294 lb (133.4 kg), SpO2 100 %. Body mass index is 50.46 kg/m.  General: Cooperative, alert, well developed, in no acute distress. HEENT: Conjunctivae and lids unremarkable. Cardiovascular: Regular rhythm.  Lungs: Normal work of breathing. Neurologic: No focal deficits.   Lab Results  Component Value Date   CREATININE 1.00 02/23/2022   BUN 30 (H) 02/23/2022   NA 143 02/23/2022   K 5.0 02/23/2022   CL 104 02/23/2022   CO2 23 02/23/2022   Lab Results  Component Value Date   ALT 20 02/23/2022   AST 19 02/23/2022   ALKPHOS 75 02/23/2022   BILITOT 0.3 02/23/2022   Lab Results  Component Value Date   HGBA1C 6.2 (H) 02/23/2022   HGBA1C 6.3 (H) 10/09/2021   HGBA1C 6.3 (H) 01/28/2021   Lab Results  Component Value Date   INSULIN 3.5 02/23/2022   INSULIN 3.7 01/28/2021   Lab Results  Component Value Date   TSH 3.060 09/17/2022   Lab Results  Component Value Date   CHOL 155 02/23/2022   HDL 47 02/23/2022   LDLCALC 86 02/23/2022   TRIG 121 02/23/2022   Lab Results  Component Value Date   VD25OH 46.3 09/17/2022   VD25OH 51.6 02/23/2022   VD25OH 60.3 10/09/2021   Lab Results  Component Value Date   WBC 8.8 09/17/2022   HGB 14.3 09/17/2022   HCT 43.0 09/17/2022   MCV 87  09/17/2022   PLT 324 09/17/2022   Lab Results  Component Value Date   FERRITIN 27 09/17/2022   Attestation Statements:   Reviewed by clinician on day of visit: allergies, medications, problem list, medical history, surgical history, family history, social history, and previous encounter notes.  I, Kathlene November, BS, CMA, am acting as transcriptionist for [employer], DO.  I have reviewed the above documentation for accuracy and completeness, and I agree with the above. Jearld Lesch, DO

## 2023-02-01 ENCOUNTER — Encounter: Payer: Self-pay | Admitting: Bariatrics

## 2023-02-10 ENCOUNTER — Other Ambulatory Visit: Payer: Self-pay | Admitting: Bariatrics

## 2023-02-10 DIAGNOSIS — F5089 Other specified eating disorder: Secondary | ICD-10-CM

## 2023-02-11 ENCOUNTER — Ambulatory Visit: Payer: Medicare Other | Admitting: Bariatrics

## 2023-02-17 ENCOUNTER — Encounter: Payer: Self-pay | Admitting: Nurse Practitioner

## 2023-02-17 ENCOUNTER — Ambulatory Visit (INDEPENDENT_AMBULATORY_CARE_PROVIDER_SITE_OTHER): Payer: Medicare Other | Admitting: Nurse Practitioner

## 2023-02-17 VITALS — BP 155/68 | HR 69 | Temp 97.7°F | Ht 64.0 in | Wt 299.0 lb

## 2023-02-17 DIAGNOSIS — E1169 Type 2 diabetes mellitus with other specified complication: Secondary | ICD-10-CM

## 2023-02-17 DIAGNOSIS — F5089 Other specified eating disorder: Secondary | ICD-10-CM | POA: Diagnosis not present

## 2023-02-17 DIAGNOSIS — Z6841 Body Mass Index (BMI) 40.0 and over, adult: Secondary | ICD-10-CM | POA: Diagnosis not present

## 2023-02-17 DIAGNOSIS — Z7984 Long term (current) use of oral hypoglycemic drugs: Secondary | ICD-10-CM

## 2023-02-17 DIAGNOSIS — Z794 Long term (current) use of insulin: Secondary | ICD-10-CM

## 2023-02-17 MED ORDER — BUPROPION HCL ER (SR) 150 MG PO TB12: 150.0000 mg | ORAL_TABLET | Freq: Every day | ORAL | 0 refills | Status: DC

## 2023-02-17 NOTE — Progress Notes (Signed)
Office: [phone]  /  Fax: [phone]  WEIGHT SUMMARY AND BIOMETRICS  Weight Lost Since Last Visit: 0lb  Weight Gained Since Last Visit: 5lb   Vitals Temp: 97.7 F (36.5 C) BP: (!) 155/68 Pulse Rate: 69 SpO2: 97 %   Anthropometric Measurements Height: 5\' 4"  (1.626 m) Weight: 299 lb (135.6 kg) BMI (Calculated): 51.3 Weight at Last Visit: 294lb Weight Lost Since Last Visit: 0lb Weight Gained Since Last Visit: 5lb Starting Weight: 339lb Total Weight Loss (lbs): 40 lb (18.1 kg)   Body Composition  Body Fat %: 54.3 % Fat Mass (lbs): 162.4 lbs Muscle Mass (lbs): 129.8 lbs Total Body Water (lbs): 102.2 lbs Visceral Fat Rating : 23   Other Clinical Data Fasting: no Labs: no Today's Visit #: 30 Starting Date: 01/28/21     HPI  Chief Complaint: OBESITY  Bethany Lynch is here to discuss her progress with her obesity treatment plan. She is on the the Category 2 Plan and states she is following her eating plan approximately 75 % of the time. She states she is exercising 45 minutes 2 days per week-water aerobics.   Interval History:  Since last office visit she had gained 5 pounds.  She has been on vacation since her last visit.  She struggles with B knee pain. Her goal is to lose weight to be able to have L knee surgery.  Her highest weight 340 lbs and her nadir weight was 180 lbs.  She has tried LCP but wasn't able to continue due to hypoglycemia.  She does better on Cat 2 plan.     Pharmacotherapy for weight loss: She is not currently taking medications  for medical weight loss.     Previous pharmacotherapy for medical weight loss:   Never     Bariatric surgery:  Patient never had bariatric surgery.   Pharmacotherapy for DMT2:  She is currently taking Farxiga, Metformin 500mg  BID, Tresiba 64 units am and Novolog 12 units TID.  Denies side effects.   Last A1c was 6.2 CBGs: Fasting 139   Episodes of hypoglycemia: One reading of 50 but with recheck 106.  Wasn't  symptomatic.  On ASA 81mg  and statin.  Last eye exam:  2024 She took Victoza in the past.  She stopped due to side effects.    Lab Results  Component Value Date   HGBA1C 6.2 (H) 02/23/2022   HGBA1C 6.3 (H) 10/09/2021   HGBA1C 6.3 (H) 01/28/2021   Lab Results  Component Value Date   LDLCALC 86 02/23/2022   CREATININE 1.00 02/23/2022     Disorder of eating Bethany Lynch has had issues with stress/emotional eating. Currently this is moderately controlled. Overall mood is stable. Medication(s): Bupropion SR 150 mg daily. Denies side effects.    PHYSICAL EXAM:  Blood pressure (!) 155/68, pulse 69, temperature 97.7 F (36.5 C), height 5\' 4"  (1.626 m), weight 299 lb (135.6 kg), SpO2 97 %. Body mass index is 51.32 kg/m.  General: She is overweight, cooperative, alert, well developed, and in no acute distress. PSYCH: Has normal mood, affect and thought process.   Extremities: No edema.  Neurologic: No gross sensory or motor deficits. No tremors or fasciculations noted.    DIAGNOSTIC DATA REVIEWED:  BMET    Component Value Date/Time   NA 143 02/23/2022 0953   K 5.0 02/23/2022 0953   CL 104 02/23/2022 0953   CO2 23 02/23/2022 0953   GLUCOSE 88 02/23/2022 0953   BUN 30 (H) 02/23/2022 0953   CREATININE  1.00 02/23/2022 0953   CALCIUM 9.9 02/23/2022 0953   GFRNONAA 44 (L) 01/21/2021 1324   GFRAA 51 (L) 01/21/2021 1324   Lab Results  Component Value Date   HGBA1C 6.2 (H) 02/23/2022   HGBA1C 6.3 (H) 01/28/2021   Lab Results  Component Value Date   INSULIN 3.5 02/23/2022   INSULIN 3.7 01/28/2021   Lab Results  Component Value Date   TSH 3.060 09/17/2022   CBC    Component Value Date/Time   WBC 8.8 09/17/2022 1515   RBC 4.93 09/17/2022 1515   HGB 14.3 09/17/2022 1515   HCT 43.0 09/17/2022 1515   PLT 324 09/17/2022 1515   MCV 87 09/17/2022 1515   MCH 29.0 09/17/2022 1515   MCHC 33.3 09/17/2022 1515   RDW 12.9 09/17/2022 1515   Iron Studies    Component Value  Date/Time   FERRITIN 27 09/17/2022 1515   Lipid Panel     Component Value Date/Time   CHOL 155 02/23/2022 0953   TRIG 121 02/23/2022 0953   HDL 47 02/23/2022 0953   LDLCALC 86 02/23/2022 0953   Hepatic Function Panel     Component Value Date/Time   PROT 6.7 02/23/2022 0953   ALBUMIN 4.2 02/23/2022 0953   AST 19 02/23/2022 0953   ALT 20 02/23/2022 0953   ALKPHOS 75 02/23/2022 0953   BILITOT 0.3 02/23/2022 0953      Component Value Date/Time   TSH 3.060 09/17/2022 1515   Nutritional Lab Results  Component Value Date   VD25OH 46.3 09/17/2022   VD25OH 51.6 02/23/2022   VD25OH 60.3 10/09/2021     ASSESSMENT AND PLAN  TREATMENT PLAN FOR OBESITY:  Recommended Dietary Goals  Bethany Lynch is currently in the action stage of change. As such, her goal is to continue weight management plan. She has agreed to the Category 2 Plan.  Behavioral Intervention  We discussed the following Behavioral Modification Strategies today: increasing lean protein intake, decreasing simple carbohydrates , increasing vegetables, avoiding skipping meals, increasing water intake, work on meal planning and easy cooking plans, planning for success, and keeping healthy foods at home.  Additional resources provided today: NA  Recommended Physical Activity Goals  Bethany Lynch has been advised to work up to 150 minutes of moderate intensity aerobic activity a week and strengthening exercises 2-3 times per week for cardiovascular health, weight loss maintenance and preservation of muscle mass.   She has agreed to Continue current level of physical activity    Pharmacotherapy We discussed various medication options to help Bethany Lynch with her weight loss efforts and we both agreed to to consider her options.  We discussed GLP-1 for her DMT2.  Will consider her options and will discuss with Dr. Owens Shark at her next visit. .  ASSOCIATED CONDITIONS ADDRESSED TODAY  Action/Plan  Type 2 diabetes mellitus with other  specified complication, with long-term current use of insulin (HCC) Continue to follow-up with PCP.  Continue medications as directed.  Dr. Owens Shark did discuss possibly starting a GLP-1.  Patient would like to consider and rediscuss at her next visit.  Good blood sugar control is important to decrease the likelihood of diabetic complications such as nephropathy, neuropathy, limb loss, blindness, coronary artery disease, and death. Intensive lifestyle modification including diet, exercise and weight loss are the first line of treatment for diabetes.    Other disorder of eating -     buPROPion HCl ER (SR); Take 1 tablet (150 mg total) by mouth daily.  Dispense: 30 tablet; Refill:  0.  Side effects discussed.    Morbid obesity ([place])  BMI 50.0-59.9, adult ([place])         Return in about 4 weeks (around 03/17/2023).Marland Kitchen She was informed of the importance of frequent follow up visits to maximize her success with intensive lifestyle modifications for her multiple health conditions.   ATTESTASTION STATEMENTS:  Reviewed by clinician on day of visit: allergies, medications, problem list, medical history, surgical history, family history, social history, and previous encounter notes.     Ailene Rud. Milburn Freeney FNP-C

## 2023-02-17 NOTE — Patient Instructions (Signed)

## 2023-02-25 LAB — LAB REPORT - SCANNED
A1c: 6.8
EGFR: 52

## 2023-03-11 ENCOUNTER — Ambulatory Visit: Payer: Medicare Other | Admitting: Bariatrics

## 2023-03-24 ENCOUNTER — Ambulatory Visit: Payer: Medicare Other | Attending: Cardiology | Admitting: Cardiology

## 2023-03-24 ENCOUNTER — Ambulatory Visit: Payer: Medicare Other | Admitting: Nurse Practitioner

## 2023-03-24 ENCOUNTER — Encounter: Payer: Self-pay | Admitting: Cardiology

## 2023-03-24 VITALS — BP 142/40 | HR 84 | Ht 64.0 in | Wt 299.2 lb

## 2023-03-24 DIAGNOSIS — I152 Hypertension secondary to endocrine disorders: Secondary | ICD-10-CM | POA: Diagnosis present

## 2023-03-24 DIAGNOSIS — G4733 Obstructive sleep apnea (adult) (pediatric): Secondary | ICD-10-CM | POA: Diagnosis present

## 2023-03-24 DIAGNOSIS — Z6841 Body Mass Index (BMI) 40.0 and over, adult: Secondary | ICD-10-CM | POA: Diagnosis not present

## 2023-03-24 DIAGNOSIS — I6522 Occlusion and stenosis of left carotid artery: Secondary | ICD-10-CM

## 2023-03-24 DIAGNOSIS — I350 Nonrheumatic aortic (valve) stenosis: Secondary | ICD-10-CM | POA: Diagnosis not present

## 2023-03-24 DIAGNOSIS — E1159 Type 2 diabetes mellitus with other circulatory complications: Secondary | ICD-10-CM | POA: Diagnosis present

## 2023-03-24 NOTE — Progress Notes (Signed)
Cardiology Office Note:    Date:  03/24/2023   ID:  Bethany Lynch, DOB 1951-10-03, MRN [id number]  PCP:  Jerrye Bushy, FNP  Cardiologist:  Gypsy Balsam, MD    Referring MD: Tamsen Snider, NP   Chief Complaint  Patient presents with   Follow-up  Doing very well  History of Present Illness:    Bethany Lynch is a 72 y.o. female past medical history significant for diabetes, essential hypertension, morbid obesity, carotic arterial disease, mild to moderate aortic stenosis. She is in my office today for follow-up.  Overall doing well.  Denies any chest pain tightness squeezing pressure burning chest.  She did go to [place] in July tremendously in the matter of fact she like it so much that she is going again this year.  Past Medical History:  Diagnosis Date   Anxiety    Aortic stenosis 10/19/2019   Arthritis    Back pain    CFS (chronic fatigue syndrome)    Constipation    Diabetes    Edema of both lower extremities    Essential hypertension 09/28/2018   GERD (gastroesophageal reflux disease)    Glaucoma    Hyperlipidemia 09/28/2018   IBS (irritable bowel syndrome)    Joint pain    Low energy    Lymphedema of lower extremity 01/02/2019   Palpitations    Shortness of breath 09/28/2018   SOB (shortness of breath)    Stenosis of left carotid artery 09/28/2018   Swallowing difficulty     Past Surgical History:  Procedure Laterality Date   GALLBLADDER SURGERY  07/27/1988   HERNIA REPAIR  04/04/2012   HERNIA REPAIR  09/09/2015   HYESTERECTOMY  02/06/1991    Current Medications: Current Meds  Medication Sig   amLODipine (NORVASC) 5 MG tablet Take 1 tablet (5 mg total) by mouth daily.   aspirin 81 MG chewable tablet Chew 81 mg by mouth daily.   buPROPion (WELLBUTRIN SR) 150 MG 12 hr tablet Take 1 tablet (150 mg total) by mouth daily.   calcium-vitamin D (OSCAL WITH D) 500-200 MG-UNIT tablet Take 2 tablets by mouth daily with breakfast.   cetirizine (ZYRTEC) 10 MG  tablet Take 10 mg by mouth daily.   Cholecalciferol 50 MCG (2000 UT) CAPS Take 1 capsule by mouth daily as needed (Patient determines when to take).   doxycycline (PERIOSTAT) 20 MG tablet Take 20 mg by mouth 2 (two) times daily.   esomeprazole (NEXIUM) 40 MG capsule Take 40 mg by mouth daily.   FARXIGA 10 MG TABS tablet Take 10 mg by mouth daily.   furosemide (LASIX) 40 MG tablet Take 1 tablet (40 mg total) by mouth daily.   hydrALAZINE (APRESOLINE) 50 MG tablet Take 1 tablet by mouth 3 (three) times daily.   LORazepam (ATIVAN) 0.5 MG tablet Take 0.5 mg by mouth 2 (two) times daily as needed for anxiety.   metFORMIN (GLUCOPHAGE) 500 MG tablet Take 500 mg by mouth 2 (two) times daily.   Misc Natural Products (TART CHERRY ADVANCED PO) Take 1 tablet by mouth daily as needed (Patient determines when to take).   Multiple Vitamins-Minerals (VISION PLUS PO) Take 1 tablet by mouth daily.   NON FORMULARY Take 1 tablet by mouth See admin instructions. ORGANIC TUMERIC CURCUMIN  WITH GINGER AND BIOPERINE DIETARY SUPPLEMENT TAKE AS NEEDED   NOVOLOG FLEXPEN 100 UNIT/ML FlexPen Inject 11 Units into the muscle 3 (three) times daily with meals.   Omega-3 Fatty Acids (FISH OIL PO)  Take 1,400 mg by mouth daily.   ondansetron (ZOFRAN) 4 MG tablet Take 1 tablet (4 mg total) by mouth every 8 (eight) hours as needed for nausea or vomiting.   OVER THE COUNTER MEDICATION Take 1 tablet by mouth as needed (loose stools).   Pediatric Multivitamins-Fl (MULTIVITAMIN DROPS/FLUORIDE PO) Take 1 tablet by mouth daily.   rosuvastatin (CRESTOR) 10 MG tablet TAKE 1 TABLET BY MOUTH EVERY DAY   Simethicone (GAS-X PO) Take 1 tablet by mouth as needed (Gas).   spironolactone (ALDACTONE) 25 MG tablet Take 25 mg by mouth daily. with food   telmisartan (MICARDIS) 80 MG tablet Take 1 tablet by mouth daily.   TRESIBA FLEXTOUCH 200 UNIT/ML SOPN Inject 80 Units into the skin at bedtime.   vitamin E 400 UNIT capsule Take 400 Units by mouth  daily.   zinc gluconate 50 MG tablet Take 50 mg by mouth daily.     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  Tobacco Use   Smoking status: Never   Smokeless tobacco: Never  Substance and Sexual Activity   Alcohol use: Not on file   Drug use: Not on file   Sexual activity: Not on file  Other Topics Concern   Not on file  Social History Narrative   Not on file   Social Determinants of Health   Financial Resource Strain: Not on file  Food Insecurity: Not on file  Transportation Needs: Not on file  Physical Activity: Not on file  Stress: Not on file  Social Connections: Not on file     Family History: The patient's family history includes Colon cancer in her mother; Diabetes in her mother; Diabetes type II in her mother; Heart disease in her father; High Cholesterol in her father and mother; High blood pressure in her father and mother; Leukemia in her father; Memory loss in her mother; Obesity in her father and mother; Sleep apnea in her father; Stroke in her father. ROS:   Please see the history of present illness.    All 14 point review of systems negative except as described per history of present illness  EKGs/Labs/Other Studies Reviewed:      Recent Labs: 09/17/2022: Hemoglobin 14.3; Platelets 324; TSH 3.060  Recent Lipid Panel    Component Value Date/Time   CHOL 155 02/23/2022 0953   TRIG 121 02/23/2022 0953   HDL 47 02/23/2022 0953   LDLCALC 86 02/23/2022 0953    Physical Exam:    VS:  BP (!) 142/40 (BP Location: Left Arm, Patient Position: Sitting)   Pulse 84   Ht 5\' 4"  (1.626 m)   Wt 299 lb 3.2 oz (135.7 kg)   SpO2 97%   BMI 51.36 kg/m     Wt Readings from Last 3 Encounters:   03/24/23 299 lb 3.2 oz (135.7 kg)  02/17/23 299 lb (135.6 kg)  01/13/23 294 lb (133.4 kg)     GEN:  Well nourished, well developed in no acute distress HEENT: Normal NECK: No JVD; No carotid bruits LYMPHATICS: No lymphadenopathy CARDIAC: RRR, no murmurs, no rubs, no gallops RESPIRATORY:  Clear to auscultation without rales, wheezing or rhonchi  ABDOMEN: Soft, non-tender, non-distended MUSCULOSKELETAL:  No edema; No deformity  SKIN: Warm and dry LOWER EXTREMITIES: no swelling NEUROLOGIC:  Alert and oriented x 3 PSYCHIATRIC:  Normal affect   ASSESSMENT:    1. Nonrheumatic aortic valve stenosis   2. Stenosis of left carotid artery   3. Hypertension associated with type 2 diabetes mellitus   4. BMI 50.0-59.9, adult   5. Obstructive sleep apnea    PLAN:    In order of problems listed above:  Nonrheumatic arctic valve stenosis time to repeat echocardiac to recheck degree of stenosis. Carotid arterial stenosis cardiac ultrasounds will be repeated as well. Essential hypertension blood pressure well-controlled continue present management. Dyslipidemia I did review K PN which show me data from March 2023 with LDL of 86, data from March 2024 total cholesterol 151 HDL 50.  She is on Crestor 10 which I will continue. Obstructive sleep apnea: On CPAP mask   Medication Adjustments/Labs and Tests Ordered: Current medicines are reviewed at length with the patient today.  Concerns regarding medicines are outlined above.  No orders of the defined types were placed in this encounter.  Medication changes: No orders of the defined types were placed in this encounter.   Signed, Georgeanna Lea, MD, [facility] 03/24/2023 2:15 PM    [facility]

## 2023-03-24 NOTE — Patient Instructions (Signed)
Medication Instructions:  Your physician recommends that you continue on your current medications as directed. Please refer to the Current Medication list given to you today.  *If you need a refill on your cardiac medications before your next appointment, please call your pharmacy*   Lab Work: None Ordered If you have labs (blood work) drawn today and your tests are completely normal, you will receive your results only by: MyChart Message (if you have MyChart) OR A paper copy in the mail If you have any lab test that is abnormal or we need to change your treatment, we will call you to review the results.   Testing/Procedures: Your physician has requested that you have a carotid duplex. This test is an ultrasound of the carotid arteries in your neck. It looks at blood flow through these arteries that supply the brain with blood. Allow one hour for this exam. There are no restrictions or special instructions.  Your physician has requested that you have an echocardiogram. Echocardiography is a painless test that uses sound waves to create images of your heart. It provides your doctor with information about the size and shape of your heart and how well your heart's chambers and valves are working. This procedure takes approximately one hour. There are no restrictions for this procedure. Please do NOT wear cologne, perfume, aftershave, or lotions (deodorant is allowed). Please arrive 15 minutes prior to your appointment time.     Follow-Up: At [facility], you and your health needs are our priority.  As part of our continuing mission to provide you with exceptional heart care, we have created designated Provider Care Teams.  These Care Teams include your primary Cardiologist (physician) and Advanced Practice Providers (APPs -  Physician Assistants and Nurse Practitioners) who all work together to provide you with the care you need, when you need it.  We recommend signing up for the patient  portal called "MyChart".  Sign up information is provided on this After Visit Summary.  MyChart is used to connect with patients for Virtual Visits (Telemedicine).  Patients are able to view lab/test results, encounter notes, upcoming appointments, etc.  Non-urgent messages can be sent to your provider as well.   To learn more about what you can do with MyChart, go to [email or web address].    Your next appointment:   6 month(s)  The format for your next appointment:   In Person  Provider:   Gypsy Balsam, MD    Other Instructions NA

## 2023-03-24 NOTE — Addendum Note (Signed)
Addended by: Baldo Ash D on: 03/24/2023 02:24 PM   Modules accepted: Orders

## 2023-03-25 ENCOUNTER — Encounter: Payer: Self-pay | Admitting: *Deleted

## 2023-03-25 ENCOUNTER — Encounter: Payer: Self-pay | Admitting: Nurse Practitioner

## 2023-03-25 ENCOUNTER — Ambulatory Visit (INDEPENDENT_AMBULATORY_CARE_PROVIDER_SITE_OTHER): Payer: Medicare Other | Admitting: Nurse Practitioner

## 2023-03-25 VITALS — BP 140/62 | HR 58 | Temp 98.0°F | Ht 64.0 in | Wt 296.0 lb

## 2023-03-25 DIAGNOSIS — Z6841 Body Mass Index (BMI) 40.0 and over, adult: Secondary | ICD-10-CM

## 2023-03-25 DIAGNOSIS — Z794 Long term (current) use of insulin: Secondary | ICD-10-CM

## 2023-03-25 DIAGNOSIS — Z006 Encounter for examination for normal comparison and control in clinical research program: Secondary | ICD-10-CM

## 2023-03-25 DIAGNOSIS — E1169 Type 2 diabetes mellitus with other specified complication: Secondary | ICD-10-CM | POA: Diagnosis not present

## 2023-03-25 DIAGNOSIS — Z7984 Long term (current) use of oral hypoglycemic drugs: Secondary | ICD-10-CM

## 2023-03-25 DIAGNOSIS — F5089 Other specified eating disorder: Secondary | ICD-10-CM

## 2023-03-25 MED ORDER — BUPROPION HCL ER (SR) 150 MG PO TB12
150.0000 mg | ORAL_TABLET | Freq: Every day | ORAL | 0 refills | Status: DC
Start: 2023-03-25 — End: 2023-04-22

## 2023-03-25 NOTE — Research (Signed)
Message left for Ms Askari about V1P encouraged her to call back.

## 2023-03-25 NOTE — Progress Notes (Signed)
Office: [phone]  /  Fax: [phone]  WEIGHT SUMMARY AND BIOMETRICS  Weight Lost Since Last Visit: 3 lb  No data recorded  Vitals Temp: 98 F (36.7 C) BP: (!) 140/62 Pulse Rate: (!) 58 SpO2: 97 %   Anthropometric Measurements Height:  (1.626 m) Weight: 296 lb (134.3 kg) BMI (Calculated): 50.78 Weight at Last Visit: 299 lb Weight Lost Since Last Visit: 3 lb Starting Weight: 339 lb Total Weight Loss (lbs): 43 lb (19.5 kg)   Body Composition  Body Fat %: 49.2 % Fat Mass (lbs): 1455.8 lbs Muscle Mass (lbs): 143 lbs Total Body Water (lbs): 98 lbs Visceral Fat Rating : 21   Other Clinical Data Fasting: No Labs: No Today's Visit #: 31 Starting Date: 01/28/21     HPI  Chief Complaint: OBESITY  Calley is here to discuss her progress with her obesity treatment plan. She is on the the Category 2 Plan and states she is following her eating plan approximately 75 % of the time. She states she is exercising 45 minutes 1 days per week.   Interval History:  Since last office visit she has lost 3 pounds.  Her goal is to lose weight to be able to have L knee surgery. Her highest weight 340 lbs and her nadir weight was 180 lbs.  She has tried LCP but wasn't able to continue due to hypoglycemia.  She does better on Cat 2 plan. She has been on vacation since her last visit.  She is trying to make healthier choices.  She is drinking water and diet dr pepper.  She is going to [place] May 25th and then going to [employer] June 18th.    Her highest weight 340 lbs and her nadir weight was 180 lbs.    Pharmacotherapy for weight loss: She is not currently taking medications  for medical weight loss.  Denies side effects.    Previous pharmacotherapy for medical weight loss:  None  Bariatric surgery:  Patient has not had bariatric surgery.    Pharmacotherapy for DMT2:  She is currently taking Farxiga, Metformin  BID, Tresiba 70 units am (changed by PCP) and Novolog 12  units TID. Denies side effects.   Last A1c was 6.8 on 02/24/23 CBGs: Fasting 130s, 2 hour PP 150-189,  Episodes of hypoglycemia: no On ASA  and statin.  Last eye exam:  2024 She has tried Victoza in the past and stopped due to side effects.    We have discussed GLP-1s but she would like to hold off starting until she gets back from vacation in June.    Lab Results  Component Value Date   HGBA1C 6.2 (H) 02/23/2022   HGBA1C 6.3 (H) 10/09/2021   HGBA1C 6.3 (H) 01/28/2021   Lab Results  Component Value Date   LDLCALC 86 02/23/2022   CREATININE 1.00 02/23/2022    Disorder of eating Taking Wellbutrin SR .  Denies side effects.    PHYSICAL EXAM:  Blood pressure (!) 140/62, pulse (!) 58, temperature 98 F (36.7 C), height  (1.626 m), weight 296 lb (134.3 kg), SpO2 97 %. Body mass index is 50.81 kg/m.  General: She is overweight, cooperative, alert, well developed, and in no acute distress. PSYCH: Has normal mood, affect and thought process.   Extremities: No edema.  Neurologic: No gross sensory or motor deficits. No tremors or fasciculations noted.    DIAGNOSTIC DATA REVIEWED:  BMET    Component Value Date/Time   NA 143 02/23/2022  0953   K 5.0 02/23/2022 0953   CL 104 02/23/2022 0953   CO2 23 02/23/2022 0953   GLUCOSE 88 02/23/2022 0953   BUN 30 (H) 02/23/2022 0953   CREATININE 1.00 02/23/2022 0953   CALCIUM 9.9 02/23/2022 0953   GFRNONAA 44 (L) 01/21/2021 1324   GFRAA 51 (L) 01/21/2021 1324   Lab Results  Component Value Date   HGBA1C 6.2 (H) 02/23/2022   HGBA1C 6.3 (H) 01/28/2021   Lab Results  Component Value Date   INSULIN 3.5 02/23/2022   INSULIN 3.7 01/28/2021   Lab Results  Component Value Date   TSH 3.060 09/17/2022   CBC    Component Value Date/Time   WBC 8.8 09/17/2022 1515   RBC 4.93 09/17/2022 1515   HGB 14.3 09/17/2022 1515   HCT 43.0 09/17/2022 1515   PLT 324 09/17/2022 1515   MCV 87 09/17/2022 1515   MCH 29.0 09/17/2022  1515   MCHC 33.3 09/17/2022 1515   RDW 12.9 09/17/2022 1515   Iron Studies    Component Value Date/Time   FERRITIN 27 09/17/2022 1515   Lipid Panel     Component Value Date/Time   CHOL 155 02/23/2022 0953   TRIG 121 02/23/2022 0953   HDL 47 02/23/2022 0953   LDLCALC 86 02/23/2022 0953   Hepatic Function Panel     Component Value Date/Time   PROT 6.7 02/23/2022 0953   ALBUMIN 4.2 02/23/2022 0953   AST 19 02/23/2022 0953   ALT 20 02/23/2022 0953   ALKPHOS 75 02/23/2022 0953   BILITOT 0.3 02/23/2022 0953      Component Value Date/Time   TSH 3.060 09/17/2022 1515   Nutritional Lab Results  Component Value Date   VD25OH 46.3 09/17/2022   VD25OH 51.6 02/23/2022   VD25OH 60.3 10/09/2021     ASSESSMENT AND PLAN  TREATMENT PLAN FOR OBESITY:  Recommended Dietary Goals  Braelee is currently in the action stage of change. As such, her goal is to continue weight management plan. She has agreed to the Category 2 Plan.  Behavioral Intervention  We discussed the following Behavioral Modification Strategies today: increasing lean protein intake, decreasing simple carbohydrates , increasing vegetables, increasing lower glycemic fruits, increasing fiber rich foods, avoiding skipping meals, increasing water intake, continue to practice mindfulness when eating, and planning for success.  Additional resources provided today: NA  Recommended Physical Activity Goals  Kaetlyn has been advised to work up to 150 minutes of moderate intensity aerobic activity a week and strengthening exercises 2-3 times per week for cardiovascular health, weight loss maintenance and preservation of muscle mass.   She has agreed to Think about ways to increase physical activity, Increase physical activity in their day and reduce sedentary time (increase NEAT)., and Work on scheduling and tracking physical activity.    ASSOCIATED CONDITIONS ADDRESSED TODAY  Action/Plan  Other disorder of eating -      buPROPion HCl ER (SR); Take 1 tablet (150 mg total) by mouth daily.  Dispense: 30 tablet; Refill: 0  Type 2 diabetes mellitus with other specified complication, with long-term current use of insulin Continue to follow up with PCP. Continue meds as directed.    Morbid obesity  BMI 50.0-59.9, adult      We have discussed GLP-1s but she would like to hold off starting until she gets back from vacation in June.   Return in about 4 weeks (around 04/22/2023).Marland Kitchen She was informed of the importance of frequent follow up visits to maximize  her success with intensive lifestyle modifications for her multiple health conditions.   ATTESTASTION STATEMENTS:  Reviewed by clinician on day of visit: allergies, medications, problem list, medical history, surgical history, family history, social history, and previous encounter notes.     Theodis Sato. Kasandra Fehr FNP-C

## 2023-03-25 NOTE — Progress Notes (Deleted)
Office: [phone]  /  Fax: [phone]  WEIGHT SUMMARY AND BIOMETRICS  No data recorded No data recorded  Vitals BP: (!) 142/40 Pulse Rate: 84 SpO2: 97 %   Anthropometric Measurements Height:  (1.626 m) Weight: 299 lb 3.2 oz (135.7 kg) BMI (Calculated): 51.33   No data recorded No data recorded   HPI  Chief Complaint: OBESITY  Bethany Lynch is here to discuss her progress with her obesity treatment plan. She is on the the Category 2 Plan and states she is following her eating plan approximately 75 % of the time. She states she is exercising 45 minutes 1 days per week.   Interval History:  Since last office visit she ***   Pharmacotherapy for weight loss: She {srtis (Optional):29129} currently taking {srtpreviousweightlossmeds (Optional):29124} for medical weight loss.  Denies side effects.    Previous pharmacotherapy for medical weight loss:  {srtpreviousweightlossmeds (Optional):29124}  She stopped *** due to side effects of ***.  Bariatric surgery:  Patient is status post {srtweightlosssurgery:29125} by Dr.  Marland Kitchen in ***.  Her highest weight prior to surgery was *** and her nadir weight after surgery was ***.  She is taking {srtvitamins (Optional):29126}.  She reports {srtrestriction (Optional):29127} restriction.     PHYSICAL EXAM:  There were no vitals taken for this visit. There is no height or weight on file to calculate BMI.  General: She is overweight, cooperative, alert, well developed, and in no acute distress. PSYCH: Has normal mood, affect and thought process.   Extremities: No edema.  Neurologic: No gross sensory or motor deficits. No tremors or fasciculations noted.    DIAGNOSTIC DATA REVIEWED:  BMET    Component Value Date/Time   NA 143 02/23/2022 0953   K 5.0 02/23/2022 0953   CL 104 02/23/2022 0953   CO2 23 02/23/2022 0953   GLUCOSE 88 02/23/2022 0953   BUN 30 (H) 02/23/2022 0953   CREATININE 1.00 02/23/2022 0953   CALCIUM 9.9  02/23/2022 0953   GFRNONAA 44 (L) 01/21/2021 1324   GFRAA 51 (L) 01/21/2021 1324   Lab Results  Component Value Date   HGBA1C 6.2 (H) 02/23/2022   HGBA1C 6.3 (H) 01/28/2021   Lab Results  Component Value Date   INSULIN 3.5 02/23/2022   INSULIN 3.7 01/28/2021   Lab Results  Component Value Date   TSH 3.060 09/17/2022   CBC    Component Value Date/Time   WBC 8.8 09/17/2022 1515   RBC 4.93 09/17/2022 1515   HGB 14.3 09/17/2022 1515   HCT 43.0 09/17/2022 1515   PLT 324 09/17/2022 1515   MCV 87 09/17/2022 1515   MCH 29.0 09/17/2022 1515   MCHC 33.3 09/17/2022 1515   RDW 12.9 09/17/2022 1515   Iron Studies    Component Value Date/Time   FERRITIN 27 09/17/2022 1515   Lipid Panel     Component Value Date/Time   CHOL 155 02/23/2022 0953   TRIG 121 02/23/2022 0953   HDL 47 02/23/2022 0953   LDLCALC 86 02/23/2022 0953   Hepatic Function Panel     Component Value Date/Time   PROT 6.7 02/23/2022 0953   ALBUMIN 4.2 02/23/2022 0953   AST 19 02/23/2022 0953   ALT 20 02/23/2022 0953   ALKPHOS 75 02/23/2022 0953   BILITOT 0.3 02/23/2022 0953      Component Value Date/Time   TSH 3.060 09/17/2022 1515   Nutritional Lab Results  Component Value Date   VD25OH 46.3 09/17/2022   VD25OH 51.6 02/23/2022  VD25OH 60.3 10/09/2021     ASSESSMENT AND PLAN  TREATMENT PLAN FOR OBESITY:  Recommended Dietary Goals  Vianca is currently in the action stage of change. As such, her goal is to continue weight management plan. She has agreed to {MWMwtlossportion/plan2:23431}.  Behavioral Intervention  We discussed the following Behavioral Modification Strategies today: {EMWMwtlossstrategies:28914::"increasing lean protein intake","decreasing simple carbohydrates ","increasing vegetables","increasing lower glycemic fruits","increasing water intake","continue to practice mindfulness when eating","planning for success"}.  Additional resources provided today: NA  Recommended  Physical Activity Goals  Sitlali has been advised to work up to 150 minutes of moderate intensity aerobic activity a week and strengthening exercises 2-3 times per week for cardiovascular health, weight loss maintenance and preservation of muscle mass.   She has agreed to {EMEXERCISE:28847}   Pharmacotherapy We discussed various medication options to help Nancye with her weight loss efforts and we both agreed to ***.  ASSOCIATED CONDITIONS ADDRESSED TODAY  Action/Plan  There are no diagnoses linked to this encounter.       No follow-ups on file.Marland Kitchen She was informed of the importance of frequent follow up visits to maximize her success with intensive lifestyle modifications for her multiple health conditions.   ATTESTASTION STATEMENTS:  Reviewed by clinician on day of visit: allergies, medications, problem list, medical history, surgical history, family history, social history, and previous encounter notes.   Time spent on visit including pre-visit chart review and post-visit care and charting was *** minutes.    Theodis Sato. Laurajean Hosek FNP-C

## 2023-03-25 NOTE — Patient Instructions (Signed)

## 2023-04-08 ENCOUNTER — Ambulatory Visit (INDEPENDENT_AMBULATORY_CARE_PROVIDER_SITE_OTHER): Payer: Medicare Other

## 2023-04-08 ENCOUNTER — Ambulatory Visit: Payer: Medicare Other | Attending: Cardiology

## 2023-04-08 DIAGNOSIS — I350 Nonrheumatic aortic (valve) stenosis: Secondary | ICD-10-CM | POA: Diagnosis present

## 2023-04-08 DIAGNOSIS — I6522 Occlusion and stenosis of left carotid artery: Secondary | ICD-10-CM | POA: Diagnosis present

## 2023-04-08 LAB — ECHOCARDIOGRAM COMPLETE
AR max vel: 1.59 cm2
AV Area VTI: 1.66 cm2
AV Area mean vel: 1.62 cm2
AV Mean grad: 14.8 mmHg
AV Peak grad: 27.3 mmHg
Ao pk vel: 2.61 m/s
S' Lateral: 2.7 cm

## 2023-04-18 ENCOUNTER — Other Ambulatory Visit: Payer: Self-pay | Admitting: Nurse Practitioner

## 2023-04-18 DIAGNOSIS — F5089 Other specified eating disorder: Secondary | ICD-10-CM

## 2023-04-22 ENCOUNTER — Encounter: Payer: Self-pay | Admitting: Bariatrics

## 2023-04-22 ENCOUNTER — Ambulatory Visit (INDEPENDENT_AMBULATORY_CARE_PROVIDER_SITE_OTHER): Payer: Medicare Other | Admitting: Bariatrics

## 2023-04-22 VITALS — BP 142/69 | HR 63 | Temp 97.7°F | Ht 64.0 in | Wt 297.0 lb

## 2023-04-22 DIAGNOSIS — Z7984 Long term (current) use of oral hypoglycemic drugs: Secondary | ICD-10-CM

## 2023-04-22 DIAGNOSIS — Z6841 Body Mass Index (BMI) 40.0 and over, adult: Secondary | ICD-10-CM | POA: Diagnosis not present

## 2023-04-22 DIAGNOSIS — E1169 Type 2 diabetes mellitus with other specified complication: Secondary | ICD-10-CM

## 2023-04-22 DIAGNOSIS — Z794 Long term (current) use of insulin: Secondary | ICD-10-CM

## 2023-04-22 DIAGNOSIS — F5089 Other specified eating disorder: Secondary | ICD-10-CM | POA: Diagnosis not present

## 2023-04-22 MED ORDER — BUPROPION HCL ER (SR) 150 MG PO TB12: 150.0000 mg | ORAL_TABLET | Freq: Every day | ORAL | 0 refills | Status: DC

## 2023-04-22 NOTE — Progress Notes (Signed)
WEIGHT SUMMARY AND BIOMETRICS  Weight Gained Since Last Visit: 1lb   Vitals Temp: 97.7 F (36.5 C) BP: (!) 142/69 Pulse Rate: 63 SpO2: 95 %   Anthropometric Measurements Height: 5\' 4"  (1.626 m) Weight: 297 lb (134.7 kg) BMI (Calculated): 50.95 Weight at Last Visit: 296lb Weight Lost Since Last Visit: 0 Weight Gained Since Last Visit: 1lb Starting Weight: 339lb Total Weight Loss (lbs): 42 lb (19.1 kg)   Body Composition  Body Fat %: 55.6 % Fat Mass (lbs): 165.2 lbs Muscle Mass (lbs): 125.4 lbs Visceral Fat Rating : 23   Other Clinical Data Fasting: no Labs: no Today's Visit #: 32 Starting Date: 01/28/21    OBESITY Bethany Lynch is here to discuss her progress with her obesity treatment plan along with follow-up of her obesity related diagnoses.     Nutrition Plan: the Category 2 plan - 40% adherence.  Current exercise: water aerobics  Interim History:  She is down 3 lbs since her last visit.  Protein intake is as prescribed and Water intake is adequate.  Pharmacotherapy:  Hunger is moderately controlled.  Cravings are moderately controlled.   Assessment/Plan:   1. Other disorder of eating Eating disorder/emotional eating Bethany Lynch has had issues with stress eating and boredom eating. Currently this is moderately controlled. Overall mood is stable. Denies suicidal/homicidal ideation. Medication(s): Wellbutrin 150 mg daily in the am  Plan:  Motivational interviewing as well as evidence-based interventions for health behavior change were utilized today including the discussion of self monitoring techniques, problem-solving barriers and SMART goal setting techniques.  Specifically regarding patient's less desirable eating habits and patterns, we employed the technique of small changes when she cannot fully commit to her prudent nutritional plan. Consider a referral to Dr. Dewaine Conger, PhD psychologist to help with eating behaviors.  Discussed distractions  to curb eating behaviors. Discussed activities to do with one's hands in the evening  Be sure to get adequate rest as lack of rest can trigger appetite.  Have plan in place for stressful events.  Consider other rewards besides food.    2. Type 2 diabetes mellitus with other specified complication, with long-term current use of insulin (HCC)  She is taking Metformin, Farxiga, and Tresiba. . She is unable to take GLP-1's due to side effects.     Morbid Obesity: Current BMI BMI (Calculated): 50.95   Pharmacotherapy Plan Continue and refill    Bethany Lynch is currently in the action stage of change. As such, her goal is to continue with weight loss efforts.  She has agreed to the Category 2 plan.  Exercise goals: Older adults should follow the adult guidelines. When older adults cannot meet the adult guidelines, they should be as physically active as their abilities and conditions will allow.  Older adults should do exercises that maintain or improve balance if they are at risk of falling.   Behavioral modification strategies: meal planning , increase water intake, and better snacking choices.  Bethany Lynch has agreed to follow-up with our clinic in 4 weeks.   No orders of the defined types were placed in this encounter.   Medications Discontinued During This Encounter  Medication Reason   buPROPion (WELLBUTRIN SR) 150 MG 12 hr tablet Reorder     Meds ordered this encounter  Medications   buPROPion (WELLBUTRIN SR) 150 MG 12 hr tablet    Sig: Take 1 tablet (150 mg total) by mouth daily.    Dispense:  30 tablet    Refill:  0  Objective:   VITALS: Per patient if applicable, see vitals. GENERAL: Alert and in no acute distress. CARDIOPULMONARY: No increased WOB. Speaking in clear sentences.  PSYCH: Pleasant and cooperative. Speech normal rate and rhythm. Affect is appropriate. Insight and judgement are appropriate. Attention is focused, linear, and appropriate.  NEURO: Oriented as  arrived to appointment on time with no prompting.   Attestation Statements:    This was prepared with the assistance of Engineer, civil (consulting).  Occasional wrong-word or sound-a-like substitutions may have occurred due to the inherent limitations of voice  recognition software.   [name], DO

## 2023-05-09 ENCOUNTER — Other Ambulatory Visit: Payer: Self-pay | Admitting: Cardiology

## 2023-05-17 ENCOUNTER — Ambulatory Visit (INDEPENDENT_AMBULATORY_CARE_PROVIDER_SITE_OTHER): Payer: Medicare Other | Admitting: Nurse Practitioner

## 2023-05-17 ENCOUNTER — Encounter: Payer: Self-pay | Admitting: Nurse Practitioner

## 2023-05-17 VITALS — BP 149/71 | HR 56 | Temp 98.1°F | Ht 64.0 in | Wt 299.0 lb

## 2023-05-17 DIAGNOSIS — E1169 Type 2 diabetes mellitus with other specified complication: Secondary | ICD-10-CM

## 2023-05-17 DIAGNOSIS — F5089 Other specified eating disorder: Secondary | ICD-10-CM

## 2023-05-17 DIAGNOSIS — Z6841 Body Mass Index (BMI) 40.0 and over, adult: Secondary | ICD-10-CM | POA: Diagnosis not present

## 2023-05-17 DIAGNOSIS — Z794 Long term (current) use of insulin: Secondary | ICD-10-CM

## 2023-05-17 DIAGNOSIS — Z7984 Long term (current) use of oral hypoglycemic drugs: Secondary | ICD-10-CM

## 2023-05-17 MED ORDER — BUPROPION HCL ER (SR) 150 MG PO TB12: 150.0000 mg | ORAL_TABLET | Freq: Every day | ORAL | 0 refills | Status: DC

## 2023-05-17 NOTE — Progress Notes (Signed)
Office: [phone]  /  Fax: [phone]  WEIGHT SUMMARY AND BIOMETRICS  No data recorded Weight Gained Since Last Visit: 2lb   Vitals Temp: 98.1 F (36.7 C) BP: (!) 149/71 Pulse Rate: (!) 56 SpO2: 98 %   Anthropometric Measurements Height: 5\' 4"  (1.626 m) Weight: 299 lb (135.6 kg) BMI (Calculated): 51.3 Weight at Last Visit: 297lb Weight Gained Since Last Visit: 2lb Starting Weight: 339lb Total Weight Loss (lbs): 40 lb (18.1 kg)   Body Composition  Body Fat %: 60.4 % Fat Mass (lbs): 180.8 lbs Muscle Mass (lbs): 112.6 lbs Visceral Fat Rating : 25   Other Clinical Data Fasting: No Labs: No Today's Visit #: 55 Starting Date: 01/28/21     HPI  Chief Complaint: OBESITY  Bethany Lynch is here to discuss her progress with her obesity treatment plan. She is on the the Category 2 Plan and states she is following her eating plan approximately 40-50 % of the time. She states she is exercising 0 minutes 0 days per week.   Interval History:  Since last office visit she has gained 2 pounds. She has been on vacation for 2 weeks since her last visit and is leaving tomorrow for another 1 week vacation.  She recently saw the pain clinic and podiatry.  She had a cortisone injection in her right foot 1 week ago.    Pharmacotherapy for weight loss: She is not currently taking medications  for medical weight loss.   Previous pharmacotherapy for medical weight loss:  none   Bariatric surgery:  has not had bariatric surgery  Pharmacotherapy for DMT2:   She is currently taking Farxiga, Metformin 500mg  BID, Tresiba 70 units am and Novolog 12 units TID. Denies side effects.   Last A1c was 6.8 CBGs: Fasting 70-180, 2 hour PP 160s-170s   Episodes of hypoglycemia: no On ASA 81mg  and statin.  Last eye exam:  2024 She has tried Victoza in the past and stopped due to side effects.    We have discussed GLP-1s but she would like to hold off starting until she gets back from vacation  in August.  She did have side effects to Victoza in the past but is considering retrying a GLP-1 again.   Lab Results  Component Value Date   HGBA1C 6.2 (H) 02/23/2022   HGBA1C 6.3 (H) 10/09/2021   HGBA1C 6.3 (H) 01/28/2021   Lab Results  Component Value Date   LDLCALC 86 02/23/2022   CREATININE 1.00 02/23/2022    Disorder of eating Taking Wellbutrin SR 150mg .  Denies side effects.  Helps with cravings.    PHYSICAL EXAM:  Blood pressure (!) 149/71, pulse (!) 56, temperature 98.1 F (36.7 C), height 5\' 4"  (1.626 m), weight 299 lb (135.6 kg), SpO2 98 %. Body mass index is 51.32 kg/m.  General: She is overweight, cooperative, alert, well developed, and in no acute distress. PSYCH: Has normal mood, affect and thought process.   Extremities: No edema.  Neurologic: No gross sensory or motor deficits. No tremors or fasciculations noted.    DIAGNOSTIC DATA REVIEWED:  BMET    Component Value Date/Time   NA 143 02/23/2022 0953   K 5.0 02/23/2022 0953   CL 104 02/23/2022 0953   CO2 23 02/23/2022 0953   GLUCOSE 88 02/23/2022 0953   BUN 30 (H) 02/23/2022 0953   CREATININE 1.00 02/23/2022 0953   CALCIUM 9.9 02/23/2022 0953   GFRNONAA 44 (L) 01/21/2021 1324   GFRAA 51 (L) 01/21/2021 1324  Lab Results  Component Value Date   HGBA1C 6.2 (H) 02/23/2022   HGBA1C 6.3 (H) 01/28/2021   Lab Results  Component Value Date   INSULIN 3.5 02/23/2022   INSULIN 3.7 01/28/2021   Lab Results  Component Value Date   TSH 3.060 09/17/2022   CBC    Component Value Date/Time   WBC 8.8 09/17/2022 1515   RBC 4.93 09/17/2022 1515   HGB 14.3 09/17/2022 1515   HCT 43.0 09/17/2022 1515   PLT 324 09/17/2022 1515   MCV 87 09/17/2022 1515   MCH 29.0 09/17/2022 1515   MCHC 33.3 09/17/2022 1515   RDW 12.9 09/17/2022 1515   Iron Studies    Component Value Date/Time   FERRITIN 27 09/17/2022 1515   Lipid Panel     Component Value Date/Time   CHOL 155 02/23/2022 0953   TRIG 121  02/23/2022 0953   HDL 47 02/23/2022 0953   LDLCALC 86 02/23/2022 0953   Hepatic Function Panel     Component Value Date/Time   PROT 6.7 02/23/2022 0953   ALBUMIN 4.2 02/23/2022 0953   AST 19 02/23/2022 0953   ALT 20 02/23/2022 0953   ALKPHOS 75 02/23/2022 0953   BILITOT 0.3 02/23/2022 0953      Component Value Date/Time   TSH 3.060 09/17/2022 1515   Nutritional Lab Results  Component Value Date   VD25OH 46.3 09/17/2022   VD25OH 51.6 02/23/2022   VD25OH 60.3 10/09/2021     ASSESSMENT AND PLAN  TREATMENT PLAN FOR OBESITY:  Recommended Dietary Goals  Bethany Lynch is currently in the action stage of change. As such, her goal is to continue weight management plan. She has agreed to the Category 2 Plan.  Behavioral Intervention  We discussed the following Behavioral Modification Strategies today: increasing lean protein intake, decreasing simple carbohydrates , increasing vegetables, increasing lower glycemic fruits, increasing fiber rich foods, avoiding skipping meals, increasing water intake, work on meal planning and preparation, keeping healthy foods at home, work on managing stress, creating time for self-care and relaxation measures, continue to practice mindfulness when eating, and planning for success.  Additional resources provided today: NA  Recommended Physical Activity Goals  Bethany Lynch has been advised to work up to 150 minutes of moderate intensity aerobic activity a week and strengthening exercises 2-3 times per week for cardiovascular health, weight loss maintenance and preservation of muscle mass.   She has agreed to Think about ways to increase daily physical activity and overcoming barriers to exercise    ASSOCIATED CONDITIONS ADDRESSED TODAY  Action/Plan  Type 2 diabetes mellitus with other specified complication, with long-term current use of insulin (HCC) Continue to follow-up with PCP.  Continue medications as directed.    Good blood sugar control is  important to decrease the likelihood of diabetic complications such as nephropathy, neuropathy, limb loss, blindness, coronary artery disease, and death. Intensive lifestyle modification including diet, exercise and weight loss are the first line of treatment for diabetes.    Other disorder of eating -     buPROPion HCl ER (SR); Take 1 tablet (150 mg total) by mouth daily.  Dispense: 30 tablet; Refill: 0  Morbid obesity (HCC)  BMI 50.0-59.9, adult (HCC)       She is scheduled for labs in Aug with PCP.    Return in about 4 weeks (around 06/14/2023).Marland Kitchen She was informed of the importance of frequent follow up visits to maximize her success with intensive lifestyle modifications for her multiple health conditions.  ATTESTASTION STATEMENTS:  Reviewed by clinician on day of visit: allergies, medications, problem list, medical history, surgical history, family history, social history, and previous encounter notes.     Theodis Sato. Rigley Niess FNP-C

## 2023-05-27 ENCOUNTER — Other Ambulatory Visit: Payer: Self-pay | Admitting: Cardiology

## 2023-06-13 ENCOUNTER — Other Ambulatory Visit: Payer: Self-pay | Admitting: Nurse Practitioner

## 2023-06-13 DIAGNOSIS — F5089 Other specified eating disorder: Secondary | ICD-10-CM

## 2023-06-17 ENCOUNTER — Encounter: Payer: Self-pay | Admitting: Bariatrics

## 2023-06-17 ENCOUNTER — Ambulatory Visit (INDEPENDENT_AMBULATORY_CARE_PROVIDER_SITE_OTHER): Payer: Medicare Other | Admitting: Bariatrics

## 2023-06-17 VITALS — BP 127/79 | HR 54 | Temp 97.5°F | Ht 64.0 in | Wt 299.0 lb

## 2023-06-17 DIAGNOSIS — Z6841 Body Mass Index (BMI) 40.0 and over, adult: Secondary | ICD-10-CM | POA: Diagnosis not present

## 2023-06-17 DIAGNOSIS — I10 Essential (primary) hypertension: Secondary | ICD-10-CM

## 2023-06-17 DIAGNOSIS — F5089 Other specified eating disorder: Secondary | ICD-10-CM

## 2023-06-17 MED ORDER — BUPROPION HCL ER (SR) 150 MG PO TB12: 150.0000 mg | ORAL_TABLET | Freq: Every day | ORAL | 0 refills | Status: DC

## 2023-06-17 NOTE — Progress Notes (Signed)
WEIGHT SUMMARY AND BIOMETRICS  Weight Lost Since Last Visit: 0  Weight Gained Since Last Visit: 0   Vitals Temp: (!) 97.5 F (36.4 C) BP: 127/79 Pulse Rate: (!) 54 SpO2: 98 %   Anthropometric Measurements Height: 5\' 4"  (1.626 m) Weight: 299 lb (135.6 kg) BMI (Calculated): 51.3 Weight at Last Visit: 299lb Weight Lost Since Last Visit: 0 Weight Gained Since Last Visit: 0 Starting Weight: 339lb Total Weight Loss (lbs): 40 lb (18.1 kg)   Body Composition  Body Fat %: 59.4 % Fat Mass (lbs): 177.6 lbs Muscle Mass (lbs): 115.2 lbs Visceral Fat Rating : 24   Other Clinical Data Fasting: no Labs: no Today's Visit #: 34 Starting Date: 01/28/21    OBESITY Bethany Lynch is here to discuss her progress with her obesity treatment plan along with follow-up of her obesity related diagnoses.     Nutrition Plan: the Category 2 plan - 60% adherence.  Current exercise:  water aerobics  Interim History:  Her weight remains the same. She has been on vacation.  Eating all of the food on the plan., Protein intake is as prescribed, and Water intake is adequate.  Hunger is moderately controlled.  Cravings are moderately controlled.   Assessment/Plan:   1. Other disorder of eating  Bethany Lynch has had issues with stress eating and emotional eating. Currently this is moderately controlled. Overall mood is stable. Denies suicidal/homicidal ideation. Medication(s): Wellbutrin 150 mg daily in the am  Plan:  Specifically regarding patient's less desirable eating habits and patterns, we employed the technique of small changes when she cannot fully commit to her prudent nutritional plan. Travel sheet.  Discussed distractions to curb eating behaviors. Discussed activities to do with one's hands in the evening  Be sure to get adequate rest as lack of rest can trigger appetite.  Have plan in place for stressful events.  Consider other rewards besides food.     Hypertension Hypertension stable.  Medication(s): Micardis, Spirinolactone and Norvasc.   BP Readings from Last 3 Encounters:  06/17/23 127/79  05/17/23 (!) 149/71  04/22/23 (!) 142/69   Lab Results  Component Value Date   CREATININE 1.00 02/23/2022   CREATININE 0.90 10/09/2021   CREATININE 1.26 (H) 01/21/2021   No results found for: "GFR"  Plan: Continue all antihypertensives at current dosages. No added salt. Will keep sodium content to 1,500 mg or less per day.     Morbid Obesity: Current BMI BMI (Calculated): 51.3    Bethany Lynch is currently in the action stage of change. As such, her goal is to continue with weight loss efforts.  She has agreed to the Category 2 plan.  Exercise goals: Older adults should follow the adult guidelines. When older adults cannot meet the adult guidelines, they should be as physically active as their abilities and conditions will allow.   Behavioral modification strategies: increasing lean protein intake, decrease eating out, meal planning , planning for success, and mindful eating.  Bethany Lynch has agreed to follow-up with our clinic in 4 weeks.     Objective:   VITALS: Per patient if applicable, see vitals. GENERAL: Alert and in no acute distress. CARDIOPULMONARY: No increased WOB. Speaking in clear sentences.  PSYCH: Pleasant and cooperative. Speech normal rate and rhythm. Affect is appropriate. Insight and judgement are appropriate. Attention is focused, linear, and appropriate.  NEURO: Oriented as arrived to appointment on time with no prompting.   Attestation Statements:   This was prepared with the assistance of Engineer, civil (consulting).  Occasional wrong-word or sound-a-like substitutions may have occurred due to the inherent limitations of voice recognition software.   [name], DO

## 2023-07-22 ENCOUNTER — Encounter: Payer: Self-pay | Admitting: Nurse Practitioner

## 2023-07-22 ENCOUNTER — Ambulatory Visit (INDEPENDENT_AMBULATORY_CARE_PROVIDER_SITE_OTHER): Payer: Medicare Other | Admitting: Nurse Practitioner

## 2023-07-22 VITALS — BP 148/70 | HR 68 | Temp 98.0°F | Ht 64.0 in | Wt 299.0 lb

## 2023-07-22 DIAGNOSIS — I890 Lymphedema, not elsewhere classified: Secondary | ICD-10-CM | POA: Diagnosis not present

## 2023-07-22 DIAGNOSIS — Z6841 Body Mass Index (BMI) 40.0 and over, adult: Secondary | ICD-10-CM

## 2023-07-22 DIAGNOSIS — Z7985 Long-term (current) use of injectable non-insulin antidiabetic drugs: Secondary | ICD-10-CM

## 2023-07-22 DIAGNOSIS — Z7984 Long term (current) use of oral hypoglycemic drugs: Secondary | ICD-10-CM

## 2023-07-22 DIAGNOSIS — Z794 Long term (current) use of insulin: Secondary | ICD-10-CM

## 2023-07-22 DIAGNOSIS — E1169 Type 2 diabetes mellitus with other specified complication: Secondary | ICD-10-CM

## 2023-07-22 NOTE — Progress Notes (Addendum)
Office: [phone]  /  Fax: [phone]  WEIGHT SUMMARY AND BIOMETRICS  Weight Lost Since Last Visit: 0lb  Weight Gained Since Last Visit: 0lb   Vitals Temp: 98 F (36.7 C) BP: (!) 148/70 Pulse Rate: 68 SpO2: 99 %   Anthropometric Measurements Height: 5\' 4"  (1.626 m) Weight: 299 lb (135.6 kg) BMI (Calculated): 51.3 Weight at Last Visit: 299lb Weight Lost Since Last Visit: 0lb Weight Gained Since Last Visit: 0lb Starting Weight: 339lb Total Weight Loss (lbs): 40 lb (18.1 kg)   Body Composition  Body Fat %: 58.6 % Fat Mass (lbs): 175.4 lbs Muscle Mass (lbs): 117.8 lbs Visceral Fat Rating : 24   Other Clinical Data Fasting: No Labs: No Today's Visit #: 35 Starting Date: 01/28/21     HPI  Chief Complaint: OBESITY  Bethany Lynch is here to discuss her progress with her obesity treatment plan. She is on the the Category 2 Plan and states she is following her eating plan approximately 50 % of the time. She states she is exercising 45 minutes 1 days per week.   Interval History:  Since last office visit she has maintained her weight.  She is not skipping meals.     Pharmacotherapy for weight loss: She is not currently taking medications  for medical weight loss.    Previous pharmacotherapy for medical weight loss:  None  Bariatric surgery:  Patient has not had bariatric surgery  Pharmacotherapy for DMT2:   She is currently taking Farxiga, Metformin 1000 mg BID (increased by PCP), Evaristo Bury 70 units am and Novolog 12 units TID. Denies side effects.   She was started on Mounjaro 2.5mg  2 weeks ago by her PCP and tolerating well.   Last A1c was 7.0 on 07/01/23 CBGs: 98-133 average 117 over the past 2 weeks since increasing Metformin and starting Mounjaro 2.5mg     Episodes of hypoglycemia: 50 yesterday at lunch time-once On ASA 81mg  and statin.  Last eye exam:  2024-next appt is Dec 16th She has tried Victoza in the past and stopped due to side effects.    B  Lymphedema  Seeing a vein specialist and has started lymha press compression therapy.  She is seeing them on a regular basis for varicose vein.    PHYSICAL EXAM:  Blood pressure (!) 148/70, pulse 68, temperature 98 F (36.7 C), height 5\' 4"  (1.626 m), weight 299 lb (135.6 kg), SpO2 99%. Body mass index is 51.32 kg/m.  General: She is overweight, cooperative, alert, well developed, and in no acute distress. PSYCH: Has normal mood, affect and thought process.   Extremities: No edema.  Neurologic: No gross sensory or motor deficits. No tremors or fasciculations noted.    DIAGNOSTIC DATA REVIEWED:  BMET    Component Value Date/Time   NA 143 02/23/2022 0953   K 5.0 02/23/2022 0953   CL 104 02/23/2022 0953   CO2 23 02/23/2022 0953   GLUCOSE 88 02/23/2022 0953   BUN 30 (H) 02/23/2022 0953   CREATININE 1.00 02/23/2022 0953   CALCIUM 9.9 02/23/2022 0953   GFRNONAA 44 (L) 01/21/2021 1324   GFRAA 51 (L) 01/21/2021 1324   Lab Results  Component Value Date   HGBA1C 6.2 (H) 02/23/2022   HGBA1C 6.3 (H) 01/28/2021   Lab Results  Component Value Date   INSULIN 3.5 02/23/2022   INSULIN 3.7 01/28/2021   Lab Results  Component Value Date   TSH 3.060 09/17/2022   CBC    Component Value Date/Time   WBC  8.8 09/17/2022 1515   RBC 4.93 09/17/2022 1515   HGB 14.3 09/17/2022 1515   HCT 43.0 09/17/2022 1515   PLT 324 09/17/2022 1515   MCV 87 09/17/2022 1515   MCH 29.0 09/17/2022 1515   MCHC 33.3 09/17/2022 1515   RDW 12.9 09/17/2022 1515   Iron Studies    Component Value Date/Time   FERRITIN 27 09/17/2022 1515   Lipid Panel     Component Value Date/Time   CHOL 155 02/23/2022 0953   TRIG 121 02/23/2022 0953   HDL 47 02/23/2022 0953   LDLCALC 86 02/23/2022 0953   Hepatic Function Panel     Component Value Date/Time   PROT 6.7 02/23/2022 0953   ALBUMIN 4.2 02/23/2022 0953   AST 19 02/23/2022 0953   ALT 20 02/23/2022 0953   ALKPHOS 75 02/23/2022 0953   BILITOT 0.3  02/23/2022 0953      Component Value Date/Time   TSH 3.060 09/17/2022 1515   Nutritional Lab Results  Component Value Date   VD25OH 46.3 09/17/2022   VD25OH 51.6 02/23/2022   VD25OH 60.3 10/09/2021     ASSESSMENT AND PLAN  TREATMENT PLAN FOR OBESITY:  Recommended Dietary Goals  Bethany Lynch is currently in the action stage of change. As such, her goal is to continue weight management plan. She has agreed to the Category 2 Plan.  Behavioral Intervention  We discussed the following Behavioral Modification Strategies today: increasing lean protein intake, decreasing simple carbohydrates , increasing vegetables, increasing lower glycemic fruits, increasing water intake, reading food labels , keeping healthy foods at home, continue to practice mindfulness when eating, and planning for success.  Additional resources provided today: NA  Recommended Physical Activity Goals  Bethany Lynch has been advised to work up to 150 minutes of moderate intensity aerobic activity a week and strengthening exercises 2-3 times per week for cardiovascular health, weight loss maintenance and preservation of muscle mass.   She has agreed to Think about ways to increase daily physical activity and overcoming barriers to exercise and Increase physical activity in their day and reduce sedentary time (increase NEAT).   ASSOCIATED CONDITIONS ADDRESSED TODAY  Action/Plan  Type 2 diabetes mellitus with other specified complication, with long-term current use of insulin (HCC) To contact PCP to discuss insulin, GLP-1 and Metformin Do not skip meals  B Lymphedema  Continue to follow up with [facility]   Morbid obesity (HCC)  BMI 50.0-59.9, adult (HCC)     07/27/23:  prescribed Mounjaro 2.5mg , will adjust insulin as directed by PCP.  Continue to monitor BS at home.      Return in about 4 weeks (around 08/19/2023).Marland Kitchen She was informed of the importance of frequent follow up visits to maximize her  success with intensive lifestyle modifications for her multiple health conditions.   ATTESTASTION STATEMENTS:  Reviewed by clinician on day of visit: allergies, medications, problem list, medical history, surgical history, family history, social history, and previous encounter notes.   Time spent on visit including pre-visit chart review and post-visit care and charting was 30 minutes.    Theodis Sato. Freddrick Gladson FNP-C

## 2023-07-27 ENCOUNTER — Other Ambulatory Visit: Payer: Self-pay | Admitting: Nurse Practitioner

## 2023-07-27 DIAGNOSIS — E1169 Type 2 diabetes mellitus with other specified complication: Secondary | ICD-10-CM

## 2023-07-27 MED ORDER — TIRZEPATIDE 2.5 MG/0.5ML ~~LOC~~ SOAJ
2.5000 mg | SUBCUTANEOUS | 0 refills | Status: DC
Start: 2023-07-27 — End: 2023-09-13

## 2023-07-27 NOTE — Addendum Note (Signed)
Addended by: Irene Limbo on: 07/27/2023 07:23 AM   Modules accepted: Orders

## 2023-08-14 ENCOUNTER — Other Ambulatory Visit: Payer: Self-pay | Admitting: Bariatrics

## 2023-08-14 DIAGNOSIS — F5089 Other specified eating disorder: Secondary | ICD-10-CM

## 2023-08-19 ENCOUNTER — Ambulatory Visit (INDEPENDENT_AMBULATORY_CARE_PROVIDER_SITE_OTHER): Payer: Medicare Other | Admitting: Bariatrics

## 2023-08-19 ENCOUNTER — Encounter: Payer: Self-pay | Admitting: Bariatrics

## 2023-08-19 VITALS — BP 145/73 | HR 65 | Temp 97.6°F | Ht 64.0 in | Wt 302.0 lb

## 2023-08-19 DIAGNOSIS — Z6841 Body Mass Index (BMI) 40.0 and over, adult: Secondary | ICD-10-CM | POA: Diagnosis not present

## 2023-08-19 DIAGNOSIS — E1169 Type 2 diabetes mellitus with other specified complication: Secondary | ICD-10-CM | POA: Diagnosis not present

## 2023-08-19 DIAGNOSIS — R632 Polyphagia: Secondary | ICD-10-CM

## 2023-08-19 DIAGNOSIS — Z7985 Long-term (current) use of injectable non-insulin antidiabetic drugs: Secondary | ICD-10-CM

## 2023-08-19 DIAGNOSIS — I890 Lymphedema, not elsewhere classified: Secondary | ICD-10-CM

## 2023-08-19 DIAGNOSIS — Z794 Long term (current) use of insulin: Secondary | ICD-10-CM

## 2023-08-19 NOTE — Progress Notes (Signed)
WEIGHT SUMMARY AND BIOMETRICS  Weight Gained Since Last Visit: 3lb   Vitals Temp: 97.6 F (36.4 C) BP: (!) 145/73 Pulse Rate: 65 SpO2: 97 %   Anthropometric Measurements Height: 5\' 4"  (1.626 m) Weight: (!) 302 lb (137 kg) BMI (Calculated): 51.81 Weight at Last Visit: 299lb Weight Gained Since Last Visit: 3lb Starting Weight: 339lb Total Weight Loss (lbs): 37 lb (16.8 kg)   Body Composition  Body Fat %: 59.1 % Fat Mass (lbs): 179 lbs Muscle Mass (lbs): 117.6 lbs Visceral Fat Rating : 25   Other Clinical Data Fasting: no Labs: no Today's Visit #: 33 Starting Date: 01/28/21    OBESITY Satin is here to discuss her progress with her obesity treatment plan along with follow-up of her obesity related diagnoses.     Nutrition Plan: the Category 2 plan - 70% adherence.  Current exercise: none  Interim History:  She is up 3 lbs since her next visit. She is using the lymph pumps on her legs, and and venous ablation in both legs.  Eating all of the food on the plan., Protein intake is as prescribed, Is not skipping meals, and Water intake is adequate.  Pharmacotherapy: Nili is on Mounjaro 2.5 mg SQ weekly Adverse side effects: GERD, and occasional bloating Hunger is moderately controlled.  Cravings are moderately controlled.  Assessment/Plan:   1. Type 2 diabetes mellitus with other specified complication, with long-term current use of insulin (HCC) Type II Diabetes HgbA1c is at goal. Last A1c was 6.2 CBGs: Fasting 90 to 130      Libre 3 system Episodes of hypoglycemia: yes - occasionally  Medication(s): Mounjaro 2.5 mg SQ weekly  Lab Results  Component Value Date   HGBA1C 6.2 (H) 02/23/2022   HGBA1C 6.3 (H) 10/09/2021   HGBA1C 6.3 (H) 01/28/2021   Lab Results  Component Value Date   LDLCALC 86 02/23/2022   CREATININE 1.00 02/23/2022   No results found for: "GFR"  Plan: Continue Mounjaro 2.5 mg SQ weekly Continue all other  medications.  Will keep all carbohydrates low both sweets and starches.  Will do some minor exercise.  Aim for 7 to 9 hours of sleep nightly.  Eat more low glycemic index foods.  Will decrease liquid calories.   Polyphagia Lennie endorses excessive hunger at times. .  Medication(s): Mounjaro Effects of medication:  moderately controlled. Cravings are moderately controlled.   Plan: Medication(s): Mounjaro 2.5 mg SQ weekly, and Metformin.  Will increase water, protein and fiber to help assuage hunger.  Will minimize foods that have a high glucose index/load to minimize reactive hypoglycemia. She will talk to her PCP about lowering     Morbid Obesity: Current BMI BMI (Calculated): 51.81   Pharmacotherapy Plan Continue  Mounjaro 2.5 mg SQ weekly  Kourtlyn is currently in the action stage of change. As such, her goal is to continue with weight loss efforts.  She has agreed to the Category 2 plan.  Exercise goals: Older adults should do exercises that maintain or improve balance if they are at risk of falling.  She is walking at least 5 minutes daily.   Behavioral modification strategies: increasing lean protein intake, no meal skipping, meal planning , increase water intake, better snacking choices, planning for success, get rid of junk food in the home, and mindful eating.  Jolett has agreed to follow-up with our clinic in 4 weeks.     Objective:   VITALS: Per patient if applicable, see vitals. GENERAL: Alert and in  no acute distress. CARDIOPULMONARY: No increased WOB. Speaking in clear sentences.  PSYCH: Pleasant and cooperative. Speech normal rate and rhythm. Affect is appropriate. Insight and judgement are appropriate. Attention is focused, linear, and appropriate.  NEURO: Oriented as arrived to appointment on time with no prompting.   Attestation Statements:   This was prepared with the assistance of Engineer, civil (consulting).  Occasional wrong-word or sound-a-like  substitutions may have occurred due to the inherent limitations of voice recognition software. [name], DO

## 2023-09-06 LAB — HM DIABETES EYE EXAM

## 2023-09-13 ENCOUNTER — Encounter: Payer: Self-pay | Admitting: Bariatrics

## 2023-09-13 ENCOUNTER — Ambulatory Visit (INDEPENDENT_AMBULATORY_CARE_PROVIDER_SITE_OTHER): Payer: Medicare Other | Admitting: Bariatrics

## 2023-09-13 VITALS — BP 143/66 | HR 60 | Temp 97.6°F | Ht 64.0 in | Wt 301.0 lb

## 2023-09-13 DIAGNOSIS — E1169 Type 2 diabetes mellitus with other specified complication: Secondary | ICD-10-CM

## 2023-09-13 DIAGNOSIS — E782 Mixed hyperlipidemia: Secondary | ICD-10-CM

## 2023-09-13 DIAGNOSIS — Z7985 Long-term (current) use of injectable non-insulin antidiabetic drugs: Secondary | ICD-10-CM

## 2023-09-13 DIAGNOSIS — E66813 Obesity, class 3: Secondary | ICD-10-CM | POA: Diagnosis not present

## 2023-09-13 DIAGNOSIS — Z794 Long term (current) use of insulin: Secondary | ICD-10-CM

## 2023-09-13 DIAGNOSIS — Z6841 Body Mass Index (BMI) 40.0 and over, adult: Secondary | ICD-10-CM

## 2023-09-13 LAB — HM MAMMOGRAPHY

## 2023-09-13 MED ORDER — TIRZEPATIDE 5 MG/0.5ML ~~LOC~~ SOAJ
5.0000 mg | SUBCUTANEOUS | 0 refills | Status: DC
Start: 2023-09-13 — End: 2023-10-06

## 2023-09-15 NOTE — Progress Notes (Signed)
Chief Complaint:   OBESITY Bethany Lynch is here to discuss her progress with her obesity treatment plan along with follow-up of her obesity related diagnoses. Bethany Lynch is on the Category 2 Plan and states she is following her eating plan approximately 70% of the time. Bethany Lynch states she is doing water aerobics for 45 minutes 2 times per week.  Today's visit was #: 37 Starting weight: 339 lbs Starting date: 01/28/2021 Today's weight: 301 lbs Today's date: 09/13/2023 Total lbs lost to date: 38 Total lbs lost since last in-office visit: 1  Interim History: Patient is down 1 lb since her last visit.   Subjective:   1. Type 2 diabetes mellitus with other specified complication, with long-term current use of insulin (HCC) Patient is taking Mounjaro 2.5 mg, and she notes occasional belching.   2. Mixed hyperlipidemia Patient is taking Crestor, and she denies myalgias.   Assessment/Plan:   1. Type 2 diabetes mellitus with other specified complication, with long-term current use of insulin (HCC) Patient agreed to increase Mounjaro to 5 mg once weekly, and we will refill for 1 month.   - tirzepatide [facility]) 5 MG/0.5ML Pen; Inject 5 mg into the skin once a week.  Dispense: 2 mL; Refill: 0  2. Mixed hyperlipidemia Patient will continue her Crestor as directed.   3. Class 3 severe obesity due to excess calories with serious comorbidity and body mass index (BMI) of 50.0 to 59.9 in adult [employer] [employer]) Bethany Lynch is currently in the action stage of change. As such, her goal is to continue with weight loss efforts. She has agreed to the Category 2 Plan and the Pescatarian Plan.   Meal planning and intentional eating were discussed.   Exercise goals: As is.   Behavioral modification strategies: increasing lean protein intake, decreasing simple carbohydrates, increasing vegetables, increasing water intake, decreasing eating out, no skipping meals, meal planning and cooking strategies, keeping healthy foods  in the home, and planning for success.  Bethany Lynch has agreed to follow-up with our clinic in 4 weeks. She was informed of the importance of frequent follow-up visits to maximize her success with intensive lifestyle modifications for her multiple health conditions.   Objective:   Blood pressure (!) 143/66, pulse 60, temperature 97.6 F (36.4 C), height 5\' 4"  (1.626 m), weight (!) 301 lb (136.5 kg), SpO2 99%. Body mass index is 51.67 kg/m.  General: Cooperative, alert, well developed, in no acute distress. HEENT: Conjunctivae and lids unremarkable. Cardiovascular: Regular rhythm.  Lungs: Normal work of breathing. Neurologic: No focal deficits.   Lab Results  Component Value Date   CREATININE 1.00 02/23/2022   BUN 30 (H) 02/23/2022   NA 143 02/23/2022   K 5.0 02/23/2022   CL 104 02/23/2022   CO2 23 02/23/2022   Lab Results  Component Value Date   ALT 20 02/23/2022   AST 19 02/23/2022   ALKPHOS 75 02/23/2022   BILITOT 0.3 02/23/2022   Lab Results  Component Value Date   HGBA1C 6.2 (H) 02/23/2022   HGBA1C 6.3 (H) 10/09/2021   HGBA1C 6.3 (H) 01/28/2021   Lab Results  Component Value Date   INSULIN 3.5 02/23/2022   INSULIN 3.7 01/28/2021   Lab Results  Component Value Date   TSH 3.060 09/17/2022   Lab Results  Component Value Date   CHOL 155 02/23/2022   HDL 47 02/23/2022   LDLCALC 86 02/23/2022   TRIG 121 02/23/2022   Lab Results  Component Value Date   VD25OH 46.3 09/17/2022  VD25OH 51.6 02/23/2022   VD25OH 60.3 10/09/2021   Lab Results  Component Value Date   WBC 8.8 09/17/2022   HGB 14.3 09/17/2022   HCT 43.0 09/17/2022   MCV 87 09/17/2022   PLT 324 09/17/2022   Lab Results  Component Value Date   FERRITIN 27 09/17/2022   Attestation Statements:   Reviewed by clinician on day of visit: allergies, medications, problem list, medical history, surgical history, family history, social history, and previous encounter notes.    Trude Mcburney, am  acting as Energy manager for [employer], DO.  I have reviewed the above documentation for accuracy and completeness, and I agree with the above. [name], DO

## 2023-10-04 LAB — COMPREHENSIVE METABOLIC PANEL
Albumin: 4.2 (ref 3.5–5.0)
Calcium: 10.5 (ref 8.7–10.7)
Globulin: 2.4

## 2023-10-04 LAB — LIPID PANEL
Cholesterol: 132 (ref 0–200)
HDL: 48 (ref 35–70)
Triglycerides: 115 (ref 40–160)

## 2023-10-04 LAB — BASIC METABOLIC PANEL
BUN: 26 — AB (ref 4–21)
CO2: 24 — AB (ref 13–22)
Chloride: 101 (ref 99–108)
Creatinine: 1 (ref 0.5–1.1)
Glucose: 112
Potassium: 5.3 meq/L — AB (ref 3.5–5.1)
Sodium: 140 (ref 137–147)

## 2023-10-04 LAB — HEPATIC FUNCTION PANEL
ALT: 23 U/L (ref 7–35)
AST: 19 (ref 13–35)
Alkaline Phosphatase: 72 (ref 25–125)
Bilirubin, Total: 0.3

## 2023-10-04 LAB — HEMOGLOBIN A1C: Hemoglobin A1C: 6

## 2023-10-06 ENCOUNTER — Ambulatory Visit (INDEPENDENT_AMBULATORY_CARE_PROVIDER_SITE_OTHER): Payer: Medicare Other | Admitting: Nurse Practitioner

## 2023-10-06 ENCOUNTER — Encounter: Payer: Self-pay | Admitting: Nurse Practitioner

## 2023-10-06 VITALS — BP 138/69 | HR 63 | Temp 97.7°F | Ht 64.0 in | Wt 296.0 lb

## 2023-10-06 DIAGNOSIS — E1169 Type 2 diabetes mellitus with other specified complication: Secondary | ICD-10-CM | POA: Diagnosis not present

## 2023-10-06 DIAGNOSIS — Z7985 Long-term (current) use of injectable non-insulin antidiabetic drugs: Secondary | ICD-10-CM

## 2023-10-06 DIAGNOSIS — Z6841 Body Mass Index (BMI) 40.0 and over, adult: Secondary | ICD-10-CM | POA: Diagnosis not present

## 2023-10-06 DIAGNOSIS — Z794 Long term (current) use of insulin: Secondary | ICD-10-CM | POA: Diagnosis not present

## 2023-10-06 MED ORDER — TIRZEPATIDE 5 MG/0.5ML ~~LOC~~ SOAJ: 5.0000 mg | SUBCUTANEOUS | 0 refills | Status: DC

## 2023-10-06 NOTE — Progress Notes (Signed)
Office: [phone]  /  Fax: [phone]  WEIGHT SUMMARY AND BIOMETRICS  Weight Lost Since Last Visit: 5lb  Weight Gained Since Last Visit: 0lb   Vitals Temp: 97.7 F (36.5 C) BP: 138/69 Pulse Rate: 63 SpO2: 100 %   Anthropometric Measurements Height: 5\' 4"  (1.626 m) Weight: 296 lb (134.3 kg) BMI (Calculated): 50.78 Weight at Last Visit: 301lb Weight Lost Since Last Visit: 5lb Weight Gained Since Last Visit: 0lb Starting Weight: 339lb Total Weight Loss (lbs): 43 lb (19.5 kg)   Body Composition  Body Fat %: 53.8 % Fat Mass (lbs): 159.2 lbs Muscle Mass (lbs): 130 lbs Total Body Water (lbs): 93.4 lbs Visceral Fat Rating : 22   Other Clinical Data Fasting: No Labs: No Today's Visit #: 38 Starting Date: 01/28/21     HPI  Chief Complaint: OBESITY  Bethany Lynch is here to discuss her progress with her obesity treatment plan. She is on the the Category 2 Plan and states she is following her eating plan approximately 75-80 % of the time. She states she is exercising 0 minutes 0 days per week. She is not doing water aerobics at this time due to having treatment on her varicose veins.  She is wearing compression hose and is scheduled for another procedure.     Interval History:  Since last office visit she has lost 5 pounds.  She is drinking water daily.    Pharmacotherapy for weight loss: She is not currently taking medications  for medical weight loss.     Previous pharmacotherapy for medical weight loss:  None   Bariatric surgery:  Patient has not had bariatric surgery   Pharmacotherapy for DMT2:   She is currently taking Mounjaro 5mg  (notes some GI upset), Farxiga, Metformin 1000 mg BID (increased by PCP), Evaristo Bury 70 units am and Novolog 12 units TID.   Last A1c was 7.0 on 07/01/23-had labs obtained this week. She is seeing her PCP tomorrow for follow up.   CBGs: average 116 Episodes of hypoglycemia: once episode of 50 On ASA 81mg  and statin.  Last eye exam:   2024-next appt is Dec 16th She has tried Victoza in the past and stopped due to side effects.       PHYSICAL EXAM:  Blood pressure 138/69, pulse 63, temperature 97.7 F (36.5 C), height 5\' 4"  (1.626 m), weight 296 lb (134.3 kg), SpO2 100%. Body mass index is 50.81 kg/m.  General: She is overweight, cooperative, alert, well developed, and in no acute distress. PSYCH: Has normal mood, affect and thought process.   Extremities: No edema.  Neurologic: No gross sensory or motor deficits. No tremors or fasciculations noted.    DIAGNOSTIC DATA REVIEWED:  BMET    Component Value Date/Time   NA 143 02/23/2022 0953   K 5.0 02/23/2022 0953   CL 104 02/23/2022 0953   CO2 23 02/23/2022 0953   GLUCOSE 88 02/23/2022 0953   BUN 30 (H) 02/23/2022 0953   CREATININE 1.00 02/23/2022 0953   CALCIUM 9.9 02/23/2022 0953   GFRNONAA 44 (L) 01/21/2021 1324   GFRAA 51 (L) 01/21/2021 1324   Lab Results  Component Value Date   HGBA1C 6.2 (H) 02/23/2022   HGBA1C 6.3 (H) 01/28/2021   Lab Results  Component Value Date   INSULIN 3.5 02/23/2022   INSULIN 3.7 01/28/2021   Lab Results  Component Value Date   TSH 3.060 09/17/2022   CBC    Component Value Date/Time   WBC 8.8 09/17/2022 1515  RBC 4.93 09/17/2022 1515   HGB 14.3 09/17/2022 1515   HCT 43.0 09/17/2022 1515   PLT 324 09/17/2022 1515   MCV 87 09/17/2022 1515   MCH 29.0 09/17/2022 1515   MCHC 33.3 09/17/2022 1515   RDW 12.9 09/17/2022 1515   Iron Studies    Component Value Date/Time   FERRITIN 27 09/17/2022 1515   Lipid Panel     Component Value Date/Time   CHOL 155 02/23/2022 0953   TRIG 121 02/23/2022 0953   HDL 47 02/23/2022 0953   LDLCALC 86 02/23/2022 0953   Hepatic Function Panel     Component Value Date/Time   PROT 6.7 02/23/2022 0953   ALBUMIN 4.2 02/23/2022 0953   AST 19 02/23/2022 0953   ALT 20 02/23/2022 0953   ALKPHOS 75 02/23/2022 0953   BILITOT 0.3 02/23/2022 0953      Component Value Date/Time    TSH 3.060 09/17/2022 1515   Nutritional Lab Results  Component Value Date   VD25OH 46.3 09/17/2022   VD25OH 51.6 02/23/2022   VD25OH 60.3 10/09/2021     ASSESSMENT AND PLAN  TREATMENT PLAN FOR OBESITY:  Recommended Dietary Goals  Bethany Lynch is currently in the action stage of change. As such, her goal is to continue weight management plan. She has agreed to the Category 2 Plan.  Behavioral Intervention  We discussed the following Behavioral Modification Strategies today: increasing lean protein intake to established goals, increasing water intake , reading food labels , keeping healthy foods at home, and continue to work on maintaining a reduced calorie state, getting the recommended amount of protein, incorporating whole foods, making healthy choices, staying well hydrated and practicing mindfulness when eating..  Additional resources provided today: NA  Recommended Physical Activity Goals  Decie has been advised to work up to 150 minutes of moderate intensity aerobic activity a week and strengthening exercises 2-3 times per week for cardiovascular health, weight loss maintenance and preservation of muscle mass.   She has agreed to Unable to participate in physical activity at present due to medical conditions    ASSOCIATED CONDITIONS ADDRESSED TODAY  Action/Plan  Type 2 diabetes mellitus with other specified complication, with long-term current use of insulin (HCC) -     continue Tirzepatide; Inject 5 mg into the skin once a week.  Dispense: 2 mL; Refill: 0. Side effects discussed.   Morbid obesity (HCC)  BMI 50.0-59.9, adult (HCC)     Keep appt with PCP tomorrow for follow up and lab results.  To discuss DMT2 meds and side effects with PCP.      Return in about 4 weeks (around 11/03/2023).Marland Kitchen She was informed of the importance of frequent follow up visits to maximize her success with intensive lifestyle modifications for her multiple health  conditions.   ATTESTASTION STATEMENTS:  Reviewed by clinician on day of visit: allergies, medications, problem list, medical history, surgical history, family history, social history, and previous encounter notes.     Theodis Sato. Kalynn Declercq FNP-C

## 2023-10-30 ENCOUNTER — Other Ambulatory Visit: Payer: Self-pay | Admitting: Nurse Practitioner

## 2023-10-30 DIAGNOSIS — E1169 Type 2 diabetes mellitus with other specified complication: Secondary | ICD-10-CM

## 2023-11-03 ENCOUNTER — Encounter: Payer: Self-pay | Admitting: Bariatrics

## 2023-11-03 ENCOUNTER — Ambulatory Visit (INDEPENDENT_AMBULATORY_CARE_PROVIDER_SITE_OTHER): Payer: Medicare Other | Admitting: Bariatrics

## 2023-11-03 VITALS — BP 156/68 | HR 61 | Temp 98.0°F | Ht 64.0 in | Wt 296.0 lb

## 2023-11-03 DIAGNOSIS — Z6841 Body Mass Index (BMI) 40.0 and over, adult: Secondary | ICD-10-CM

## 2023-11-03 DIAGNOSIS — Z7985 Long-term (current) use of injectable non-insulin antidiabetic drugs: Secondary | ICD-10-CM | POA: Diagnosis not present

## 2023-11-03 DIAGNOSIS — Z794 Long term (current) use of insulin: Secondary | ICD-10-CM | POA: Diagnosis not present

## 2023-11-03 DIAGNOSIS — E66813 Obesity, class 3: Secondary | ICD-10-CM

## 2023-11-03 DIAGNOSIS — E1169 Type 2 diabetes mellitus with other specified complication: Secondary | ICD-10-CM | POA: Diagnosis not present

## 2023-11-03 DIAGNOSIS — F5089 Other specified eating disorder: Secondary | ICD-10-CM

## 2023-11-03 MED ORDER — TIRZEPATIDE 2.5 MG/0.5ML ~~LOC~~ SOAJ: 2.5000 mg | SUBCUTANEOUS | 0 refills | Status: DC

## 2023-11-03 MED ORDER — BUPROPION HCL ER (SR) 150 MG PO TB12: 150.0000 mg | ORAL_TABLET | Freq: Every day | ORAL | 0 refills | Status: DC

## 2023-11-03 NOTE — Progress Notes (Signed)
WEIGHT SUMMARY AND BIOMETRICS  Weight Lost Since Last Visit: 0  Weight Gained Since Last Visit: 0   Vitals Temp: 98 F (36.7 C) BP: (!) 156/68 Pulse Rate: 61 SpO2: 100 %   Anthropometric Measurements Height: 5\' 4"  (1.626 m) Weight: 296 lb (134.3 kg) BMI (Calculated): 50.78 Weight at Last Visit: 296lb Weight Lost Since Last Visit: 0 Weight Gained Since Last Visit: 0 Starting Weight: 339lb Total Weight Loss (lbs): 43 lb (19.5 kg)   Body Composition  Body Fat %: 57.1 % Fat Mass (lbs): 169 lbs Muscle Mass (lbs): 120.6 lbs Visceral Fat Rating : 23   Other Clinical Data Fasting: no Labs: no Today's Visit #: 66 Starting Date: 01/28/21    OBESITY Bethany Lynch is here to discuss her progress with her obesity treatment plan along with follow-up of her obesity related diagnoses.    Nutrition Plan: the Category 2 plan - 70% adherence.  Current exercise: none  Interim History:  Her weight remains the same.  Eating all of the food on the plan., Protein intake is as prescribed, Water intake is adequate., and Denies polyphagia   Pharmacotherapy: Bethany Lynch is on Mounjaro 2.5 mg SQ weekly Adverse side effects: Nausea Hunger is moderately controlled.  Cravings are moderately controlled.  Assessment/Plan:   1. Type 2 diabetes mellitus with other specified complication, with long-term current use of insulin (HCC) Type II Diabetes HgbA1c is at goal. Last A1c was 6.0 CBGs: Fasting 120 and 130"s   occasional lows     Episodes of hypoglycemia: yes - occasionally, as she did not eat enough.  Medication(s): Mounjaro 2.5 mg SQ weekly  Lab Results  Component Value Date   HGBA1C 6.2 (H) 02/23/2022   HGBA1C 6.3 (H) 10/09/2021   HGBA1C 6.3 (H) 01/28/2021   Lab Results  Component Value Date   LDLCALC 86 02/23/2022   CREATININE 1.00 02/23/2022   No results found for:  "GFR"  Plan: Continue and decrease dose Mounjaro 2.5 mg SQ weekly Continue all other medications.  Will keep all carbohydrates low both sweets and starches.  Will continue exercise regimen to 30 to 60 minutes on most days of the week.  Aim for 7 to 9 hours of sleep nightly.  Eat more low glycemic index foods.   Eating disorder/emotional eating Bethany Lynch has had issues with stress eating, emotional eating, and boredom eating. Currently this is moderately controlled. Overall mood is stable. Denies suicidal/homicidal ideation. Medication(s): Wellbutrin 150 mg daily in the am  Plan:  Specifically regarding patient's less desirable eating habits and patterns, we employed the technique of small changes when she cannot fully commit to her prudent nutritional plan. Discussed distractions to curb eating behaviors. Discussed activities to do with one's hands in the evening  Be sure to get adequate rest as lack of rest can trigger appetite.  Have plan in place for stressful events.  Consider other rewards besides food.      Morbid Obesity: Current BMI BMI (Calculated): 50.78   Pharmacotherapy Plan Continue and refill  Mounjaro 2.5 mg SQ weekly, Metformin 1,000 mg BID.   Harjot is currently in the action stage of change. As such, her goal is to continue with weight loss efforts.  She has agreed to the Category 2 plan.  Exercise goals: Older adults should determine their level of effort for physical activity relative to their level of fitness.  [facility] She is using her lymphedema pumps.   Behavioral modification strategies: increasing lean protein intake, no meal skipping, meal planning , increase water intake, better snacking choices, increasing vegetables, increasing fiber rich foods, decrease snacking , avoiding temptations, pack lunch for work, and mindful eating.  Kortney has agreed to follow-up with our clinic in 4 weeks.      Objective:   VITALS:  Per patient if applicable, see vitals. GENERAL: Alert and in no acute distress. CARDIOPULMONARY: No increased WOB. Speaking in clear sentences.  PSYCH: Pleasant and cooperative. Speech normal rate and rhythm. Affect is appropriate. Insight and judgement are appropriate. Attention is focused, linear, and appropriate.  NEURO: Oriented as arrived to appointment on time with no prompting.   Attestation Statements:   This was prepared with the assistance of Engineer, civil (consulting).  Occasional wrong-word or sound-a-like substitutions may have occurred due to the inherent limitations of voice recognition   Bethany Capra, DO

## 2023-11-08 ENCOUNTER — Other Ambulatory Visit: Payer: Self-pay

## 2023-12-02 ENCOUNTER — Ambulatory Visit: Payer: Medicare Other | Admitting: Nurse Practitioner

## 2023-12-02 ENCOUNTER — Encounter: Payer: Self-pay | Admitting: Nurse Practitioner

## 2023-12-02 VITALS — BP 144/56 | HR 66 | Temp 97.5°F | Ht 64.0 in | Wt 294.0 lb

## 2023-12-02 DIAGNOSIS — E1159 Type 2 diabetes mellitus with other circulatory complications: Secondary | ICD-10-CM | POA: Diagnosis not present

## 2023-12-02 DIAGNOSIS — Z7985 Long-term (current) use of injectable non-insulin antidiabetic drugs: Secondary | ICD-10-CM

## 2023-12-02 DIAGNOSIS — E66813 Obesity, class 3: Secondary | ICD-10-CM | POA: Diagnosis not present

## 2023-12-02 DIAGNOSIS — I152 Hypertension secondary to endocrine disorders: Secondary | ICD-10-CM | POA: Diagnosis not present

## 2023-12-02 DIAGNOSIS — Z794 Long term (current) use of insulin: Secondary | ICD-10-CM

## 2023-12-02 DIAGNOSIS — E1169 Type 2 diabetes mellitus with other specified complication: Secondary | ICD-10-CM

## 2023-12-02 DIAGNOSIS — Z6841 Body Mass Index (BMI) 40.0 and over, adult: Secondary | ICD-10-CM

## 2023-12-02 MED ORDER — TIRZEPATIDE 2.5 MG/0.5ML ~~LOC~~ SOAJ: 2.5000 mg | SUBCUTANEOUS | 0 refills | Status: DC

## 2023-12-02 NOTE — Progress Notes (Signed)
 Office: [phone]  /  Fax: [phone]  WEIGHT SUMMARY AND BIOMETRICS  Weight Lost Since Last Visit: 2lb  Weight Gained Since Last Visit: 0   Vitals Temp: (!) 97.5 F (36.4 C) BP: (!) 144/56 Pulse Rate: 66 SpO2: 99 %   Anthropometric Measurements Height: 5' 4 (1.626 m) Weight: 294 lb (133.4 kg) BMI (Calculated): 50.44 Weight at Last Visit: 296lb Weight Lost Since Last Visit: 2lb Weight Gained Since Last Visit: 0 Starting Weight: 339lb Total Weight Loss (lbs): 45 lb (20.4 kg)   Body Composition  Body Fat %: 55.1 % Fat Mass (lbs): 162 lbs Muscle Mass (lbs): 125.4 lbs Total Body Water (lbs): 98.2 lbs Visceral Fat Rating : 23   Other Clinical Data Fasting: no Labs: no Today's Visit #: 39 Starting Date: 01/28/21     HPI  Chief Complaint: OBESITY  Kadeisha is here to discuss her progress with her obesity treatment plan. She is on the the Category 2 Plan and states she is following her eating plan approximately 25 % of the time. She states she is exercising 0 minutes 0 days per week.   Interval History:  Since last office visit she has lost 2 pounds.    Pharmacotherapy for weight loss: She is not currently taking medications  for medical weight loss.     Previous pharmacotherapy for medical weight loss:  None   Bariatric surgery:  Patient has not had bariatric surgery  Pharmacotherapy for DMT2:  She is taking Mounjaro  2.5mg  (GI upset has improved since decreasing dose from 5mg ), Farxiga , Metformin  1000 mg BID, Tresiba 70 units am and Novolog 10 units TID.   Has appt with cardiology on 2/7 and PCP on 2/10.    Last A1c was 6.0 October 04, 2023 CBGs: 95-250  Episodes of hypoglycemia: no On ASA 81mg  and statin.  Last eye exam:  Dec 16th, 2024 She has tried Victoza in the past and stopped due to side effects.     Lab Results  Component Value Date   HGBA1C 6.0 10/04/2023   HGBA1C 6.2 (H) 02/23/2022   HGBA1C 6.3 (H) 10/09/2021   Lab Results   Component Value Date   LDLCALC 86 02/23/2022   CREATININE 1.0 10/04/2023    Hypertension Had an episode on Dec 20th of dizziness and hypotension-lasted most of the day but got better in the evening.  Denies any dizziness since Dec 20th.  She has an appt with cardiology on 01/07/24. She has ordered a new BP cuff that should be delivered tomorrow.     Medication(s): Norvasc  5mg , ASA 81mg , lasix  40mg , hydralazine  50mg , spironolactone  25mg , micardis  80mg  Denies chest pain, palpitations and SOB.  BP Readings from Last 3 Encounters:  12/02/23 (!) 144/56  11/03/23 (!) 156/68  10/06/23 138/69   Lab Results  Component Value Date   CREATININE 1.0 10/04/2023   CREATININE 1.00 02/23/2022   CREATININE 0.90 10/09/2021     PHYSICAL EXAM:  Blood pressure (!) 144/56, pulse 66, temperature (!) 97.5 F (36.4 C), height 5' 4 (1.626 m), weight 294 lb (133.4 kg), SpO2 99%. Body mass index is 50.46 kg/m.  General: She is overweight, cooperative, alert, well developed, and in no acute distress. PSYCH: Has normal mood, affect and thought process.   Extremities: No edema.  Neurologic: No gross sensory or motor deficits. No tremors or fasciculations noted.    DIAGNOSTIC DATA REVIEWED:  BMET    Component Value Date/Time   NA 140 10/04/2023 0835   K 5.3 (A)  10/04/2023 0835   CL 101 10/04/2023 0835   CO2 24 (A) 10/04/2023 0835   GLUCOSE 88 02/23/2022 0953   BUN 26 (A) 10/04/2023 0835   CREATININE 1.0 10/04/2023 0835   CREATININE 1.00 02/23/2022 0953   CALCIUM  10.5 10/04/2023 0835   GFRNONAA 44 (L) 01/21/2021 1324   GFRAA 51 (L) 01/21/2021 1324   Lab Results  Component Value Date   HGBA1C 6.0 10/04/2023   HGBA1C 6.3 (H) 01/28/2021   Lab Results  Component Value Date   INSULIN 3.5 02/23/2022   INSULIN 3.7 01/28/2021   Lab Results  Component Value Date   TSH 3.060 09/17/2022   CBC    Component Value Date/Time   WBC 8.8 09/17/2022 1515   RBC 4.93 09/17/2022 1515   HGB 14.3  09/17/2022 1515   HCT 43.0 09/17/2022 1515   PLT 324 09/17/2022 1515   MCV 87 09/17/2022 1515   MCH 29.0 09/17/2022 1515   MCHC 33.3 09/17/2022 1515   RDW 12.9 09/17/2022 1515   Iron Studies    Component Value Date/Time   FERRITIN 27 09/17/2022 1515   Lipid Panel     Component Value Date/Time   CHOL 132 10/04/2023 0835   CHOL 155 02/23/2022 0953   TRIG 115 10/04/2023 0835   HDL 48 10/04/2023 0835   HDL 47 02/23/2022 0953   LDLCALC 86 02/23/2022 0953   Hepatic Function Panel     Component Value Date/Time   PROT 6.7 02/23/2022 0953   ALBUMIN 4.2 10/04/2023 0835   ALBUMIN 4.2 02/23/2022 0953   AST 19 10/04/2023 0835   ALT 23 10/04/2023 0835   ALKPHOS 72 10/04/2023 0835   BILITOT 0.3 02/23/2022 0953      Component Value Date/Time   TSH 3.060 09/17/2022 1515   Nutritional Lab Results  Component Value Date   VD25OH 46.3 09/17/2022   VD25OH 51.6 02/23/2022   VD25OH 60.3 10/09/2021     ASSESSMENT AND PLAN  TREATMENT PLAN FOR OBESITY:  Recommended Dietary Goals  Sammy is currently in the action stage of change. As such, her goal is to continue weight management plan. She has agreed to the Category 2 Plan.  Behavioral Intervention  We discussed the following Behavioral Modification Strategies today: increasing lean protein intake to established goals, decreasing simple carbohydrates , increasing vegetables, increasing fiber rich foods, increasing water intake , work on meal planning and preparation, reading food labels , keeping healthy foods at home, continue to work on implementation of reduced calorie nutritional plan, continue to practice mindfulness when eating, planning for success, better snacking choices, and continue to work on maintaining a reduced calorie state, getting the recommended amount of protein, incorporating whole foods, making healthy choices, staying well hydrated and practicing mindfulness when eating..  Additional resources provided today:  NA  Recommended Physical Activity Goals  Elysse has been advised to work up to 150 minutes of moderate intensity aerobic activity a week and strengthening exercises 2-3 times per week for cardiovascular health, weight loss maintenance and preservation of muscle mass.   She has agreed to Think about enjoyable ways to increase daily physical activity and overcoming barriers to exercise and Increase physical activity in their day and reduce sedentary time (increase NEAT).  ASSOCIATED CONDITIONS ADDRESSED TODAY  Action/Plan  Type 2 diabetes mellitus with other specified complication, with long-term current use of insulin (HCC) -     Continue Tirzepatide ; Inject 2.5 mg into the skin once a week.  Dispense: 2 mL; Refill: 0.  Side effects discussed.    Hypertension associated with type 2 diabetes mellitus (HCC) Continue to follow up with PCP and keep appt with cardiology.  Take meds as directed.  To monitor BP at home and review with cardiology.   Class 3 severe obesity due to excess calories with serious comorbidity and body mass index (BMI) of 50.0 to 59.9 in adult [facility])         Return in about 4 weeks (around 12/30/2023).SABRA She was informed of the importance of frequent follow up visits to maximize her success with intensive lifestyle modifications for her multiple health conditions.   ATTESTASTION STATEMENTS:  Reviewed by clinician on day of visit: allergies, medications, problem list, medical history, surgical history, family history, social history, and previous encounter notes.    Corean SAUNDERS. Sahalie Beth FNP-C

## 2023-12-16 ENCOUNTER — Other Ambulatory Visit: Payer: Self-pay | Admitting: Cardiology

## 2023-12-30 ENCOUNTER — Ambulatory Visit: Payer: Medicare Other | Admitting: Nurse Practitioner

## 2023-12-30 ENCOUNTER — Encounter: Payer: Self-pay | Admitting: Nurse Practitioner

## 2023-12-30 VITALS — BP 168/69 | HR 64 | Temp 98.0°F | Ht 64.0 in | Wt 292.0 lb

## 2023-12-30 DIAGNOSIS — E66813 Obesity, class 3: Secondary | ICD-10-CM | POA: Diagnosis not present

## 2023-12-30 DIAGNOSIS — E1159 Type 2 diabetes mellitus with other circulatory complications: Secondary | ICD-10-CM

## 2023-12-30 DIAGNOSIS — E1169 Type 2 diabetes mellitus with other specified complication: Secondary | ICD-10-CM

## 2023-12-30 DIAGNOSIS — I152 Hypertension secondary to endocrine disorders: Secondary | ICD-10-CM | POA: Diagnosis not present

## 2023-12-30 DIAGNOSIS — Z6841 Body Mass Index (BMI) 40.0 and over, adult: Secondary | ICD-10-CM

## 2023-12-30 DIAGNOSIS — Z794 Long term (current) use of insulin: Secondary | ICD-10-CM

## 2023-12-30 DIAGNOSIS — Z7984 Long term (current) use of oral hypoglycemic drugs: Secondary | ICD-10-CM

## 2023-12-30 DIAGNOSIS — Z7985 Long-term (current) use of injectable non-insulin antidiabetic drugs: Secondary | ICD-10-CM

## 2023-12-30 MED ORDER — TIRZEPATIDE 2.5 MG/0.5ML ~~LOC~~ SOAJ: 2.5000 mg | SUBCUTANEOUS | 0 refills | Status: DC

## 2023-12-30 NOTE — Progress Notes (Signed)
Office: [phone]  /  Fax: [phone]  WEIGHT SUMMARY AND BIOMETRICS  Weight Lost Since Last Visit: 2lb  Weight Gained Since Last Visit: 0lb   Vitals Temp: 98 F (36.7 C) BP: (!) 168/69 Pulse Rate: 64 SpO2: 98 %   Anthropometric Measurements Height: 5\' 4"  (1.626 m) Weight: 292 lb (132.5 kg) BMI (Calculated): 50.1 Weight at Last Visit: 294lb Weight Lost Since Last Visit: 2lb Weight Gained Since Last Visit: 0lb Starting Weight: 339lb Total Weight Loss (lbs): 47 lb (21.3 kg)   Body Composition  Body Fat %: 56.2 % Fat Mass (lbs): 164.4 lbs Muscle Mass (lbs): 121.6 lbs Total Body Water (lbs): 94.4 lbs Visceral Fat Rating : 23   Other Clinical Data Fasting: No Labs: No Today's Visit #: 40 Starting Date: 01/28/21     HPI  Chief Complaint: OBESITY  Bethany Lynch is here to discuss her progress with her obesity treatment plan. She is on the the Category 2 Plan and states she is following her eating plan approximately 60 % of the time. She states she is exercising 45 minutes 1 days per week.   Interval History:  Since last office visit she has lost 2 pounds. She celebrated her birthday since her last visit. She started going back to water aerobics this week.     Pharmacotherapy for weight loss: She is not currently taking medications  for medical weight loss.     Previous pharmacotherapy for medical weight loss:  None   Bariatric surgery:  Patient has not had bariatric surgery  Pharmacotherapy for DMT2:  She is taking Mounjaro 2.5mg  (diarrhea-better since decreased from 5mg ), Farxiga, Metformin 1000 mg BID, Tresiba 70 units am and Novolog 10 units TID.   Has appt with cardiology on 2/7 and PCP on 2/10.    Last A1c was 6.0 October 04, 2023 CBGs: average 127 for the past 30 days  Episodes of hypoglycemia: one in the 50s On ASA 81mg  and statin.  Last eye exam:  Dec 16th, 2024 She has tried Victoza in the past and stopped due to side effects.    Lab Results   Component Value Date   HGBA1C 6.0 10/04/2023   HGBA1C 6.2 (H) 02/23/2022   HGBA1C 6.3 (H) 10/09/2021   Lab Results  Component Value Date   LDLCALC 86 02/23/2022   CREATININE 1.0 10/04/2023     Hypertension Hypertension BP is elevated today.  Has been monitoring her BP at home-154/37, 15?/45-has appt with cardiology on 01/07/24 Medication(s): Norvasc 5mg , micardis 80mg , lasix 40mg , hydralazine 50mg  TID Denies chest pain, palpitations and SOB.  BP Readings from Last 3 Encounters:  12/30/23 (!) 168/69  12/02/23 (!) 144/56  11/03/23 (!) 156/68   Lab Results  Component Value Date   CREATININE 1.0 10/04/2023   CREATININE 1.00 02/23/2022   CREATININE 0.90 10/09/2021     PHYSICAL EXAM:  Blood pressure (!) 168/69, pulse 64, temperature 98 F (36.7 C), height 5\' 4"  (1.626 m), weight 292 lb (132.5 kg), SpO2 98%. Body mass index is 50.12 kg/m.  General: She is overweight, cooperative, alert, well developed, and in no acute distress. PSYCH: Has normal mood, affect and thought process.   Extremities: No edema.  Neurologic: No gross sensory or motor deficits. No tremors or fasciculations noted.    DIAGNOSTIC DATA REVIEWED:  BMET    Component Value Date/Time   NA 140 10/04/2023 0835   K 5.3 (A) 10/04/2023 0835   CL 101 10/04/2023 0835   CO2 24 (A) 10/04/2023  0835   GLUCOSE 88 02/23/2022 0953   BUN 26 (A) 10/04/2023 0835   CREATININE 1.0 10/04/2023 0835   CREATININE 1.00 02/23/2022 0953   CALCIUM 10.5 10/04/2023 0835   GFRNONAA 44 (L) 01/21/2021 1324   GFRAA 51 (L) 01/21/2021 1324   Lab Results  Component Value Date   HGBA1C 6.0 10/04/2023   HGBA1C 6.3 (H) 01/28/2021   Lab Results  Component Value Date   INSULIN 3.5 02/23/2022   INSULIN 3.7 01/28/2021   Lab Results  Component Value Date   TSH 3.060 09/17/2022   CBC    Component Value Date/Time   WBC 8.8 09/17/2022 1515   RBC 4.93 09/17/2022 1515   HGB 14.3 09/17/2022 1515   HCT 43.0 09/17/2022 1515   PLT  324 09/17/2022 1515   MCV 87 09/17/2022 1515   MCH 29.0 09/17/2022 1515   MCHC 33.3 09/17/2022 1515   RDW 12.9 09/17/2022 1515   Iron Studies    Component Value Date/Time   FERRITIN 27 09/17/2022 1515   Lipid Panel     Component Value Date/Time   CHOL 132 10/04/2023 0835   CHOL 155 02/23/2022 0953   TRIG 115 10/04/2023 0835   HDL 48 10/04/2023 0835   HDL 47 02/23/2022 0953   LDLCALC 86 02/23/2022 0953   Hepatic Function Panel     Component Value Date/Time   PROT 6.7 02/23/2022 0953   ALBUMIN 4.2 10/04/2023 0835   ALBUMIN 4.2 02/23/2022 0953   AST 19 10/04/2023 0835   ALT 23 10/04/2023 0835   ALKPHOS 72 10/04/2023 0835   BILITOT 0.3 02/23/2022 0953      Component Value Date/Time   TSH 3.060 09/17/2022 1515   Nutritional Lab Results  Component Value Date   VD25OH 46.3 09/17/2022   VD25OH 51.6 02/23/2022   VD25OH 60.3 10/09/2021     ASSESSMENT AND PLAN  TREATMENT PLAN FOR OBESITY:  Recommended Dietary Goals  Edell is currently in the action stage of change. As such, her goal is to continue weight management plan. She has agreed to the Category 2 Plan.  Behavioral Intervention  We discussed the following Behavioral Modification Strategies today: increasing lean protein intake to established goals, decreasing simple carbohydrates , increasing vegetables, avoiding skipping meals, increasing water intake , reading food labels , keeping healthy foods at home, continue to work on implementation of reduced calorie nutritional plan, continue to practice mindfulness when eating, and continue to work on maintaining a reduced calorie state, getting the recommended amount of protein, incorporating whole foods, making healthy choices, staying well hydrated and practicing mindfulness when eating..  Additional resources provided today: NA  Recommended Physical Activity Goals  Garnet has been advised to work up to 150 minutes of moderate intensity aerobic activity a week  and strengthening exercises 2-3 times per week for cardiovascular health, weight loss maintenance and preservation of muscle mass.   She has agreed to Think about enjoyable ways to increase daily physical activity and overcoming barriers to exercise, Increase physical activity in their day and reduce sedentary time (increase NEAT)., and Work on scheduling and tracking physical activity.    ASSOCIATED CONDITIONS ADDRESSED TODAY  Action/Plan  Type 2 diabetes mellitus with other specified complication, with long-term current use of insulin (HCC) -     Continue Tirzepatide; Inject 2.5 mg into the skin once a week.  Dispense: 2 mL; Refill: 0.  Side effects discussed  Hypertension associated with type 2 diabetes mellitus (HCC) Keep follow-up appointment with cardiology.  Take medications as directed.  She is checking her blood pressure at home.  Advised her to take her cuff into her next visit with cardiology so they can check her cuff to make sure that it is reading correctly.  Class 3 severe obesity due to excess calories with serious comorbidity and body mass index (BMI) of 50.0 to 59.9 in adult [facility])         Return in about 4 weeks (around 01/27/2024).Marland Kitchen She was informed of the importance of frequent follow up visits to maximize her success with intensive lifestyle modifications for her multiple health conditions.   ATTESTASTION STATEMENTS:  Reviewed by clinician on day of visit: allergies, medications, problem list, medical history, surgical history, family history, social history, and previous encounter notes.     Theodis Sato. Jeris Easterly FNP-C

## 2024-01-05 LAB — VITAMIN B12: Vitamin B-12: 779

## 2024-01-05 LAB — COMPREHENSIVE METABOLIC PANEL
A1c: 6.2
Albumin: 4.2 (ref 3.5–5.0)
Calcium: 10.1 (ref 8.7–10.7)
Globulin: 2.3
eGFR: 57

## 2024-01-05 LAB — HEPATIC FUNCTION PANEL
ALT: 26 U/L (ref 7–35)
AST: 22 (ref 13–35)
Alkaline Phosphatase: 70 (ref 25–125)

## 2024-01-05 LAB — TSH: TSH: 3.28 (ref 0.41–5.90)

## 2024-01-05 LAB — BASIC METABOLIC PANEL
BUN: 37 — AB (ref 4–21)
CO2: 21 (ref 13–22)
Chloride: 102 (ref 99–108)
Creatinine: 1 (ref 0.5–1.1)
Glucose: 135
Potassium: 4.8 meq/L (ref 3.5–5.1)
Sodium: 137 (ref 137–147)

## 2024-01-07 ENCOUNTER — Encounter: Payer: Self-pay | Admitting: Cardiology

## 2024-01-07 ENCOUNTER — Ambulatory Visit: Payer: Medicare Other | Attending: Cardiology | Admitting: Cardiology

## 2024-01-07 VITALS — BP 136/48 | HR 71 | Ht 64.0 in | Wt 301.2 lb

## 2024-01-07 DIAGNOSIS — R0609 Other forms of dyspnea: Secondary | ICD-10-CM | POA: Insufficient documentation

## 2024-01-07 DIAGNOSIS — E1159 Type 2 diabetes mellitus with other circulatory complications: Secondary | ICD-10-CM | POA: Insufficient documentation

## 2024-01-07 DIAGNOSIS — E782 Mixed hyperlipidemia: Secondary | ICD-10-CM | POA: Insufficient documentation

## 2024-01-07 DIAGNOSIS — E1169 Type 2 diabetes mellitus with other specified complication: Secondary | ICD-10-CM | POA: Diagnosis present

## 2024-01-07 DIAGNOSIS — G4733 Obstructive sleep apnea (adult) (pediatric): Secondary | ICD-10-CM | POA: Insufficient documentation

## 2024-01-07 DIAGNOSIS — E669 Obesity, unspecified: Secondary | ICD-10-CM | POA: Diagnosis present

## 2024-01-07 DIAGNOSIS — I152 Hypertension secondary to endocrine disorders: Secondary | ICD-10-CM | POA: Diagnosis present

## 2024-01-07 NOTE — Patient Instructions (Signed)

## 2024-01-07 NOTE — Progress Notes (Signed)
 Cardiology Office Note:    Date:  01/07/2024   ID:  Bethany Lynch, DOB March 25, 1951, MRN [id number]  PCP:  Bethany Mitzie SAUNDERS, FNP  Cardiologist:  Bethany Fitch, MD    Referring MD: Bethany Mitzie SAUNDERS, FNP   Chief Complaint  Patient presents with   Follow-up    History of Present Illness:    Bethany Lynch is a 73 y.o. female past medical history significant for diabetes, essential hypertension, morbid obesity, chronic arterial disease, mild to moderate aortic stenosis.  Comes today to months for follow-up she said in December she gets monthly situations he became dizzy she was getting making her bed became very dizzy interesting it lasted almost all day.  Otherwise no chest pain tightness squeezing pressure burning chest, if she does what she wants to do with no difficulties she did have some work done on her legs for swelling and she also got massages that she is getting for her legs  Past Medical History:  Diagnosis Date   Anxiety    Aortic stenosis 10/19/2019   Arthritis    Back pain    CFS (chronic fatigue syndrome)    Constipation    Diabetes (HCC)    Edema of both lower extremities    Essential hypertension 09/28/2018   GERD (gastroesophageal reflux disease)    Glaucoma    Hyperlipidemia 09/28/2018   IBS (irritable bowel syndrome)    Joint pain    Low energy    Lymphedema of lower extremity 01/02/2019   Palpitations    Shortness of breath 09/28/2018   SOB (shortness of breath)    Stenosis of left carotid artery 09/28/2018   Swallowing difficulty     Past Surgical History:  Procedure Laterality Date   GALLBLADDER SURGERY  07/27/1988   HERNIA REPAIR  04/04/2012   HERNIA REPAIR  09/09/2015   HYESTERECTOMY  02/06/1991    Current Medications: Current Meds  Medication Sig   amLODipine  (NORVASC ) 5 MG tablet Take 1 tablet (5 mg total) by mouth daily.   aspirin 81 MG chewable tablet Chew 81 mg by mouth daily.   buPROPion  (WELLBUTRIN  SR) 150 MG 12 hr tablet Take 1 tablet (150  mg total) by mouth daily.   calcium -vitamin D  (OSCAL WITH D) 500-200 MG-UNIT tablet Take 2 tablets by mouth daily with breakfast.   cetirizine (ZYRTEC) 10 MG tablet Take 10 mg by mouth daily.   Cholecalciferol 50 MCG (2000 UT) CAPS Take 1 capsule by mouth daily as needed (Patient determines when to take).   doxycycline (PERIOSTAT) 20 MG tablet Take 20 mg by mouth 2 (two) times daily.   esomeprazole  (NEXIUM ) 40 MG capsule Take 40 mg by mouth daily.   FARXIGA  10 MG TABS tablet Take 10 mg by mouth daily.   furosemide  (LASIX ) 40 MG tablet Take 1 tablet (40 mg total) by mouth daily.   gabapentin  (NEURONTIN ) 100 MG capsule Take 100 mg by mouth 3 (three) times daily as needed (nerve pain).   hydrALAZINE  (APRESOLINE ) 50 MG tablet Take 1 tablet by mouth 3 (three) times daily.   LORazepam  (ATIVAN ) 0.5 MG tablet Take 0.5 mg by mouth 2 (two) times daily as needed for anxiety.   metFORMIN  (GLUCOPHAGE ) 500 MG tablet Take 1,000 mg by mouth 2 (two) times daily.   Misc Natural Products (TART CHERRY ADVANCED PO) Take 1 tablet by mouth daily as needed (Patient determines when to take).   Multiple Vitamins-Minerals (VISION PLUS PO) Take 1 tablet by mouth daily.   NON FORMULARY  Take 1 tablet by mouth See admin instructions. ORGANIC TUMERIC CURCUMIN  WITH GINGER AND BIOPERINE DIETARY SUPPLEMENT TAKE AS NEEDED   NOVOLOG FLEXPEN 100 UNIT/ML FlexPen Inject 11 Units into the muscle 3 (three) times daily with meals.   Omega-3 Fatty Acids (FISH OIL PO) Take 1,400 mg by mouth daily.   ondansetron  (ZOFRAN ) 4 MG tablet Take 1 tablet (4 mg total) by mouth every 8 (eight) hours as needed for nausea or vomiting.   OVER THE COUNTER MEDICATION Take 1 tablet by mouth as needed (loose stools).   Pediatric Multivitamins-Fl (MULTIVITAMIN DROPS/FLUORIDE PO) Take 1 tablet by mouth daily.   rosuvastatin  (CRESTOR ) 10 MG tablet TAKE 1 TABLET BY MOUTH EVERY DAY   Simethicone (GAS-X PO) Take 1 tablet by mouth as needed (Gas).    spironolactone  (ALDACTONE ) 25 MG tablet Take 25 mg by mouth daily. with food   telmisartan  (MICARDIS ) 80 MG tablet Take 1 tablet by mouth daily.   tirzepatide  (MOUNJARO ) 2.5 MG/0.5ML Pen Inject 2.5 mg into the skin once a week.   TRESIBA FLEXTOUCH 200 UNIT/ML SOPN Inject 80 Units into the skin at bedtime.   vitamin E 400 UNIT capsule Take 400 Units by mouth daily.   zinc gluconate 50 MG tablet Take 50 mg by mouth daily.     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  Tobacco Use   Smoking status: Never   Smokeless tobacco: Never  Substance and Sexual Activity   Alcohol use: Not on file   Drug use: Not on file   Sexual activity: Not on file  Other Topics Concern   Not on file  Social History Narrative   Not on file   Social Drivers of Health   Financial Resource Strain: Not on file  Food Insecurity: Not on file  Transportation Needs: Not on file  Physical Activity: Not on file  Stress: Not on file  Social Connections: Not on file     Family History: The patient's family history includes Colon cancer in her mother; Diabetes in her mother; Diabetes type II in her mother; Heart disease in her father; High Cholesterol in her father and mother; High blood pressure in her father and mother; Leukemia in her father; Memory loss in her mother; Obesity in her father and mother; Sleep apnea in her father; Stroke in her father. ROS:   Please see the history of present illness.    All 14 point review of systems negative except as described per history of present illness  EKGs/Labs/Other Studies Reviewed:    EKG Interpretation Date/Time:  Friday January 07 2024 15:35:05 EST Ventricular Rate:  63 PR Interval:  146 QRS  Duration:  104 QT Interval:  386 QTC Calculation: 395 R Axis:   -36  Text Interpretation: Normal sinus rhythm Left axis deviation Left ventricular hypertrophy with repolarization abnormality Cannot rule out Septal infarct , age undetermined Abnormal ECG No previous ECGs available Confirmed by Bernie Charleston [phone]) on 01/07/2024 3:58:32 PM    Recent Labs: 10/04/2023: ALT 23; BUN 26; Creatinine 1.0; Potassium 5.3; Sodium 140  Recent Lipid Panel    Component Value Date/Time   CHOL 132 10/04/2023 0835   CHOL 155 02/23/2022 0953  TRIG 115 10/04/2023 0835   HDL 48 10/04/2023 0835   HDL 47 02/23/2022 0953   LDLCALC 86 02/23/2022 0953    Physical Exam:    VS:  BP (!) 136/48 (BP Location: Right Arm, Patient Position: Sitting)   Pulse 71   Ht 5' 4 (1.626 m)   Wt (!) 301 lb 3.2 oz (136.6 kg)   SpO2 93%   BMI 51.70 kg/m     Wt Readings from Last 3 Encounters:  01/07/24 (!) 301 lb 3.2 oz (136.6 kg)  12/30/23 292 lb (132.5 kg)  12/02/23 294 lb (133.4 kg)     GEN:  Well nourished, well developed in no acute distress HEENT: Normal NECK: No JVD; No carotid bruits LYMPHATICS: No lymphadenopathy CARDIAC: RRR, systolic ejection murmur grade 2/6 best heard right upper portion of the sternum S2 is still present, no rubs, no gallops RESPIRATORY:  Clear to auscultation without rales, wheezing or rhonchi  ABDOMEN: Soft, non-tender, non-distended MUSCULOSKELETAL:  No edema; No deformity  SKIN: Warm and dry LOWER EXTREMITIES: no swelling NEUROLOGIC:  Alert and oriented x 3 PSYCHIATRIC:  Normal affect   ASSESSMENT:    1. Hypertension associated with type 2 diabetes mellitus (HCC)   2. Obstructive sleep apnea   3. Type 2 diabetes mellitus with obesity (HCC)   4. Mixed hyperlipidemia    PLAN:    In order of problems listed above:  Aortic stenosis last time assessment was mild to moderate but she did have some symptoms that are worrisome I will schedule her to have echocardiogram  done people with severe aortic stenosis can develop shortness of breath dizziness or chest pain.  She did have dizziness. Obstructive sleep apnea followed by internal medicine team apparently stable. Dyslipidemia I did review K PN which show me LDL 86 HDL 48.  She is taking Crestor  10 mg daily which I will continue. Type 2 diabetes I did review K PN which show me hemoglobin A1c of 6.0 this is from November 2024 decent cholesterol profile   Medication Adjustments/Labs and Tests Ordered: Current medicines are reviewed at length with the patient today.  Concerns regarding medicines are outlined above.  Orders Placed This Encounter  Procedures   EKG 12-Lead   Medication changes: No orders of the defined types were placed in this encounter.   Signed, Bethany DOROTHA Fitch, MD, [facility] 01/07/2024 3:59 PM    [facility]

## 2024-01-27 ENCOUNTER — Encounter: Payer: Self-pay | Admitting: Bariatrics

## 2024-01-27 ENCOUNTER — Ambulatory Visit (INDEPENDENT_AMBULATORY_CARE_PROVIDER_SITE_OTHER): Payer: Medicare Other | Admitting: Bariatrics

## 2024-01-27 VITALS — BP 136/62 | HR 67 | Temp 97.4°F | Ht 64.0 in | Wt 297.0 lb

## 2024-01-27 DIAGNOSIS — Z794 Long term (current) use of insulin: Secondary | ICD-10-CM

## 2024-01-27 DIAGNOSIS — E66813 Obesity, class 3: Secondary | ICD-10-CM

## 2024-01-27 DIAGNOSIS — Z6841 Body Mass Index (BMI) 40.0 and over, adult: Secondary | ICD-10-CM

## 2024-01-27 DIAGNOSIS — I10 Essential (primary) hypertension: Secondary | ICD-10-CM | POA: Diagnosis not present

## 2024-01-27 DIAGNOSIS — E1169 Type 2 diabetes mellitus with other specified complication: Secondary | ICD-10-CM | POA: Diagnosis not present

## 2024-01-27 DIAGNOSIS — Z7985 Long-term (current) use of injectable non-insulin antidiabetic drugs: Secondary | ICD-10-CM

## 2024-01-27 DIAGNOSIS — I152 Hypertension secondary to endocrine disorders: Secondary | ICD-10-CM

## 2024-01-27 MED ORDER — TIRZEPATIDE 2.5 MG/0.5ML ~~LOC~~ SOAJ: 2.5000 mg | SUBCUTANEOUS | 0 refills | Status: DC

## 2024-01-27 NOTE — Progress Notes (Signed)
 WEIGHT SUMMARY AND BIOMETRICS  No data recorded No data recorded  No data recorded Anthropometric Measurements Height: 5\' 4"  (1.626 m) Weight at Last Visit: 292lb Starting Weight: 339lb   No data recorded Other Clinical Data Fasting: No Labs: No Today's Visit #: 6 Starting Date: 01/28/21    OBESITY Bethany Lynch is here to discuss her progress with her obesity treatment plan along with follow-up of her obesity related diagnoses.    Nutrition Plan: the Category 2 plan - 60% adherence.  Current exercise: aerobics (water) 45 mins a week.  Interim History:  She is up 5 lbs since his last visit.  Eating all of the food on the plan., Protein intake is less than prescribed., and Water intake is adequate.   Pharmacotherapy: Bethany Lynch is on Mounjaro 2.5 mg SQ weekly Adverse side effects: Nausea, but well controlled at the dose of 2.5 mg, increased with the dose of 5 mg. Hunger is moderately controlled.  Cravings are moderately controlled.  Assessment/Plan:   1. Type 2 diabetes mellitus with other specified complication, with long-term current use of insulin (HCC)  HgbA1c is at goal. Last A1c was 6.2 CBGs: Fasting 70 to 180       Episodes of hypoglycemia: yes - x 1 Medication(s): Mounjaro 2.5 mg SQ weekly  Lab Results  Component Value Date   HGBA1C 6.0 10/04/2023   HGBA1C 6.2 (H) 02/23/2022   HGBA1C 6.3 (H) 10/09/2021   Lab Results  Component Value Date   LDLCALC 86 02/23/2022   CREATININE 1.0 10/04/2023   No results found for: "GFR"  Plan: Continue and refill Mounjaro 2.5 mg SQ weekly Continue all other medications.  Will keep all carbohydrates low both sweets and starches.  Will continue exercise regimen to 30 to 60 minutes on most days of the week.  Aim for 7 to 9 hours of sleep nightly.  Eat more low glycemic index foods.  Recipes with with the  serving size, calories, and protein values printed.  Will continue to prepare meals at home.  Hypertension Hypertension reasonably well controlled.  Medication(s): Amlodipine 5 mg 1 daily  and Spironolactone 25 mg daily in am   and Apresoline 50 mg  BP Readings from Last 3 Encounters:  01/27/24 136/62  01/07/24 (!) 136/48  12/30/23 (!) 168/69   Lab Results  Component Value Date   CREATININE 1.0 10/04/2023   CREATININE 1.00 02/23/2022   CREATININE 0.90 10/09/2021   No results found for: "GFR"  Plan: Continue all antihypertensives at current dosages. Will check blood pressures at home.  No added salt. Will keep sodium content to 1,500 mg or less per day.     Labs reviewed today (CMP, Lipids, HgbA1c, insulin, B 12, and TSH).    Morbid Obesity: Current BMI No data recorded  Pharmacotherapy Plan Continue and refill  Mounjaro 2.5 mg SQ weekly  Bethany Lynch is currently  in the action stage of change. As such, her goal is to continue with weight loss efforts.  She has agreed to the Category 2 plan.  Exercise goals: Older adults should follow the adult guidelines. When older adults cannot meet the adult guidelines, they should be as physically active as their abilities and conditions will allow.  She will get back to her water aerobics at least 2 x per week.   Behavioral modification strategies: increasing lean protein intake, decreasing simple carbohydrates , no meal skipping, decrease eating out, meal planning , increase water intake, better snacking choices, and planning for success.  Bethany Lynch has agreed to follow-up with our clinic in 4 weeks.     Objective:   VITALS: Per patient if applicable, see vitals. GENERAL: Alert and in no acute distress. CARDIOPULMONARY: No increased WOB. Speaking in clear sentences.  PSYCH: Pleasant and cooperative. Speech normal rate and rhythm. Affect is appropriate. Insight and judgement are appropriate. Attention is focused, linear, and appropriate.   NEURO: Oriented as arrived to appointment on time with no prompting.   Attestation Statements:   This was prepared with the assistance of Engineer, civil (consulting).  Occasional wrong-word or sound-a-like substitutions may have occurred due to the inherent limitations of voice recognition   [name], DO

## 2024-01-28 ENCOUNTER — Ambulatory Visit: Payer: Medicare Other | Attending: Cardiology

## 2024-01-28 DIAGNOSIS — R0609 Other forms of dyspnea: Secondary | ICD-10-CM | POA: Insufficient documentation

## 2024-01-28 LAB — ECHOCARDIOGRAM COMPLETE
AR max vel: 1.48 cm2
AV Area VTI: 1.48 cm2
AV Area mean vel: 1.5 cm2
AV Mean grad: 17.6 mmHg
AV Peak grad: 32.2 mmHg
Ao pk vel: 2.84 m/s
Area-P 1/2: 3.13 cm2
MV M vel: 4.61 m/s
MV Peak grad: 84.8 mmHg
S' Lateral: 3 cm

## 2024-02-04 ENCOUNTER — Telehealth: Payer: Self-pay

## 2024-02-04 NOTE — Telephone Encounter (Signed)
 Echo Results reviewed with pt as per [facility] note.  Pt verbalized understanding and had no additional questions. Routed to PCP

## 2024-02-10 DIAGNOSIS — M76821 Posterior tibial tendinitis, right leg: Secondary | ICD-10-CM | POA: Insufficient documentation

## 2024-02-21 ENCOUNTER — Ambulatory Visit (INDEPENDENT_AMBULATORY_CARE_PROVIDER_SITE_OTHER): Payer: Medicare Other | Admitting: Nurse Practitioner

## 2024-02-21 ENCOUNTER — Encounter: Payer: Self-pay | Admitting: Nurse Practitioner

## 2024-02-21 VITALS — BP 117/65 | HR 64 | Temp 98.0°F | Ht 64.0 in | Wt 292.0 lb

## 2024-02-21 DIAGNOSIS — Z794 Long term (current) use of insulin: Secondary | ICD-10-CM

## 2024-02-21 DIAGNOSIS — E66813 Obesity, class 3: Secondary | ICD-10-CM | POA: Diagnosis not present

## 2024-02-21 DIAGNOSIS — E1169 Type 2 diabetes mellitus with other specified complication: Secondary | ICD-10-CM | POA: Diagnosis not present

## 2024-02-21 DIAGNOSIS — Z6841 Body Mass Index (BMI) 40.0 and over, adult: Secondary | ICD-10-CM | POA: Diagnosis not present

## 2024-02-21 DIAGNOSIS — Z7984 Long term (current) use of oral hypoglycemic drugs: Secondary | ICD-10-CM

## 2024-02-21 DIAGNOSIS — Z7985 Long-term (current) use of injectable non-insulin antidiabetic drugs: Secondary | ICD-10-CM

## 2024-02-21 MED ORDER — TIRZEPATIDE 2.5 MG/0.5ML ~~LOC~~ SOAJ: 2.5000 mg | SUBCUTANEOUS | 0 refills | Status: DC

## 2024-02-21 NOTE — Progress Notes (Signed)
 Office: [phone]  /  Fax: [phone]  WEIGHT SUMMARY AND BIOMETRICS  Weight Lost Since Last Visit: 5lb  Weight Gained Since Last Visit: 0lb   Vitals Temp: 98 F (36.7 C) BP: 117/65 Pulse Rate: 64 SpO2: 100 %   Anthropometric Measurements Height: 5\' 4"  (1.626 m) Weight: 292 lb (132.5 kg) BMI (Calculated): 50.1 Weight at Last Visit: 297lb Weight Lost Since Last Visit: 5lb Weight Gained Since Last Visit: 0lb Starting Weight: 339lb Total Weight Loss (lbs): 47 lb (21.3 kg)   Body Composition  Body Fat %: 60.2 % Fat Mass (lbs): 176.2 lbs Muscle Mass (lbs): 110.6 lbs Visceral Fat Rating : 24   Other Clinical Data Fasting: No Labs: No Today's Visit #: 42 Starting Date: 01/28/21     HPI  Chief Complaint: OBESITY  Bethany Lynch is here to discuss her progress with her obesity treatment plan. She is on the the Category 2 Plan and states she is following her eating plan approximately 70 % of the time. She states she is exercising 45 minutes 1 days per week.   Interval History:  Since last office visit she has lost 5 pounds. She doesn't feel that she is eating enough protein.  Doesn't know why she's not eating enough.  She notes she is not hungry at night.  She eats breakfast and lunch and does well.  For dinner she is eating a yogurt or yasso bar.  She feels that her weight loss is due to decreasing soda intake and drinking more water. She is also drinking coffee with collagen daily. Reports polyphagia.     Pharmacotherapy for weight loss: She is not currently taking medications  for medical weight loss.     Previous pharmacotherapy for medical weight loss:  None   Bariatric surgery:  Patient has not had bariatric surgery  Pharmacotherapy for DMT2:  She  is taking Mounjaro 2.5mg , Farxiga, Metformin 1000 mg BID, Tresiba 70 units pm and Novolog 10 units TID.  Denies side effects.  Saw cardiology last on 2/7 and PCP last on 2/10.      Last A1c was 6.2  on 2/7  CBGs:  average 120 for the past 30 days Episodes of hypoglycemia: 11 episodes over the past month and 3 episodes over the past 14 days-50s On ASA 81mg  and statin.  Last eye exam:  Dec 16th, 2024 She has tried Victoza in the past and stopped due to side effects.    Lab Results  Component Value Date   HGBA1C 6.0 10/04/2023   HGBA1C 6.2 (H) 02/23/2022   HGBA1C 6.3 (H) 10/09/2021   Lab Results  Component Value Date   LDLCALC 86 02/23/2022   CREATININE 1.0 01/05/2024       PHYSICAL EXAM:  Blood pressure 117/65, pulse 64, temperature 98 F (36.7 C), height 5\' 4"  (1.626 m), weight 292 lb (132.5 kg), SpO2 100%. Body mass index is 50.12 kg/m.  General: She is overweight, cooperative, alert, well developed, and in no acute distress. PSYCH: Has normal mood, affect and thought process.   Extremities: No edema.  Neurologic: No gross sensory or motor deficits. No tremors or fasciculations noted.    DIAGNOSTIC DATA REVIEWED:  BMET    Component Value Date/Time   NA 137 01/05/2024 0000   K 4.8 01/05/2024 0000   CL 102 01/05/2024 0000   CO2 21 01/05/2024 0000   GLUCOSE 88 02/23/2022 0953   BUN 37 (A) 01/05/2024 0000   CREATININE 1.0 01/05/2024 0000   CREATININE 1.00  02/23/2022 0953   CALCIUM 10.1 01/05/2024 0000   GFRNONAA 44 (L) 01/21/2021 1324   GFRAA 51 (L) 01/21/2021 1324   Lab Results  Component Value Date   HGBA1C 6.0 10/04/2023   HGBA1C 6.3 (H) 01/28/2021   Lab Results  Component Value Date   INSULIN 3.5 02/23/2022   INSULIN 3.7 01/28/2021   Lab Results  Component Value Date   TSH 3.28 01/05/2024   CBC    Component Value Date/Time   WBC 8.8 09/17/2022 1515   RBC 4.93 09/17/2022 1515   HGB 14.3 09/17/2022 1515   HCT 43.0 09/17/2022 1515   PLT 324 09/17/2022 1515   MCV 87 09/17/2022 1515   MCH 29.0 09/17/2022 1515   MCHC 33.3 09/17/2022 1515   RDW 12.9 09/17/2022 1515   Iron Studies    Component Value Date/Time   FERRITIN 27 09/17/2022 1515   Lipid Panel      Component Value Date/Time   CHOL 132 10/04/2023 0835   CHOL 155 02/23/2022 0953   TRIG 115 10/04/2023 0835   HDL 48 10/04/2023 0835   HDL 47 02/23/2022 0953   LDLCALC 86 02/23/2022 0953   Hepatic Function Panel     Component Value Date/Time   PROT 6.7 02/23/2022 0953   ALBUMIN 4.2 01/05/2024 0000   ALBUMIN 4.2 02/23/2022 0953   AST 22 01/05/2024 0000   ALT 26 01/05/2024 0000   ALKPHOS 70 01/05/2024 0000   BILITOT 0.3 02/23/2022 0953      Component Value Date/Time   TSH 3.28 01/05/2024 0000   TSH 3.060 09/17/2022 1515   Nutritional Lab Results  Component Value Date   VD25OH 46.3 09/17/2022   VD25OH 51.6 02/23/2022   VD25OH 60.3 10/09/2021     ASSESSMENT AND PLAN  TREATMENT PLAN FOR OBESITY:  Recommended Dietary Goals  Bethany Lynch is currently in the action stage of change. As such, her goal is to continue weight management plan. She has agreed to the Category 2 Plan.  Discussed extensively that she needs to eat dinner and a snack at dinnertime.   Behavioral Intervention  We discussed the following Behavioral Modification Strategies today: increasing lean protein intake to established goals, decreasing simple carbohydrates , increasing vegetables, increasing fiber rich foods, increasing water intake , reading food labels , keeping healthy foods at home, planning for success, and continue to work on maintaining a reduced calorie state, getting the recommended amount of protein, incorporating whole foods, making healthy choices, staying well hydrated and practicing mindfulness when eating..  Additional resources provided today: NA  Recommended Physical Activity Goals  Bethany Lynch has been advised to work up to 150 minutes of moderate intensity aerobic activity a week and strengthening exercises 2-3 times per week for cardiovascular health, weight loss maintenance and preservation of muscle mass.   She has agreed to Think about enjoyable ways to increase daily physical  activity and overcoming barriers to exercise, Increase physical activity in their day and reduce sedentary time (increase NEAT)., Increase the intensity, frequency or duration of strengthening exercises , and Increase the intensity, frequency or duration of aerobic exercises     ASSOCIATED CONDITIONS ADDRESSED TODAY  Action/Plan  Type 2 diabetes mellitus with other specified complication, with long-term current use of insulin (HCC) -     Tirzepatide; Inject 2.5 mg into the skin once a week.  Dispense: 2 mL; Refill: 0     Decrease novolog to 5 units at dinner.  To send me blood sugars next week-specifically number of lows  at night.  I wonder if her sensor is working correctly.  She notes that her sensor is usually 10 points lower then when she checks her CBGs   Class 3 severe obesity due to excess calories with serious comorbidity and body mass index (BMI) of 50.0 to 59.9 in adult [facility])      Return in about 4 weeks (around 03/20/2024).Marland Kitchen She was informed of the importance of frequent follow up visits to maximize her success with intensive lifestyle modifications for her multiple health conditions.   ATTESTASTION STATEMENTS:  Reviewed by clinician on day of visit: allergies, medications, problem list, medical history, surgical history, family history, social history, and previous encounter notes.     Theodis Sato. Tamsin Nader FNP-C

## 2024-02-28 DIAGNOSIS — M25571 Pain in right ankle and joints of right foot: Secondary | ICD-10-CM | POA: Insufficient documentation

## 2024-03-10 ENCOUNTER — Other Ambulatory Visit: Payer: Self-pay | Admitting: Bariatrics

## 2024-03-10 DIAGNOSIS — F5089 Other specified eating disorder: Secondary | ICD-10-CM

## 2024-03-11 ENCOUNTER — Other Ambulatory Visit: Payer: Self-pay | Admitting: Cardiology

## 2024-03-15 ENCOUNTER — Other Ambulatory Visit: Payer: Self-pay | Admitting: Cardiology

## 2024-03-20 ENCOUNTER — Encounter: Payer: Self-pay | Admitting: Nurse Practitioner

## 2024-03-20 ENCOUNTER — Ambulatory Visit (INDEPENDENT_AMBULATORY_CARE_PROVIDER_SITE_OTHER): Admitting: Nurse Practitioner

## 2024-03-20 VITALS — BP 130/72 | HR 70 | Temp 98.1°F | Ht 64.0 in | Wt 295.0 lb

## 2024-03-20 DIAGNOSIS — Z794 Long term (current) use of insulin: Secondary | ICD-10-CM

## 2024-03-20 DIAGNOSIS — Z6841 Body Mass Index (BMI) 40.0 and over, adult: Secondary | ICD-10-CM

## 2024-03-20 DIAGNOSIS — E1169 Type 2 diabetes mellitus with other specified complication: Secondary | ICD-10-CM | POA: Diagnosis not present

## 2024-03-20 DIAGNOSIS — Z7984 Long term (current) use of oral hypoglycemic drugs: Secondary | ICD-10-CM

## 2024-03-20 DIAGNOSIS — E66813 Obesity, class 3: Secondary | ICD-10-CM

## 2024-03-20 DIAGNOSIS — Z7985 Long-term (current) use of injectable non-insulin antidiabetic drugs: Secondary | ICD-10-CM

## 2024-03-20 MED ORDER — TIRZEPATIDE 2.5 MG/0.5ML ~~LOC~~ SOAJ: 2.5000 mg | SUBCUTANEOUS | 0 refills | Status: DC

## 2024-03-20 NOTE — Progress Notes (Signed)
 Office: [phone]  /  Fax: [phone]  WEIGHT SUMMARY AND BIOMETRICS  Weight Lost Since Last Visit: 0lb  Weight Gained Since Last Visit: 3lb   Vitals Temp: 98.1 F (36.7 C) BP: 130/72 Pulse Rate: 70 SpO2: 96 %   Anthropometric Measurements Height: 5\' 4"  (1.626 m) Weight: 295 lb (133.8 kg) BMI (Calculated): 50.61 Weight at Last Visit: 292lb Weight Lost Since Last Visit: 0lb Weight Gained Since Last Visit: 3lb Starting Weight: 339lb Total Weight Loss (lbs): 44 lb (20 kg)   No data recorded Other Clinical Data Fasting: No Labs: No Today's Visit #: 78 Starting Date: 01/28/21     HPI  Chief Complaint: OBESITY  Bethany Lynch is here to discuss her progress with her obesity treatment plan. She is on the the Category 2 Plan and states she is following her eating plan approximately 50 % of the time. She states she is exercising 45 minutes 1 days per week.   Interval History:  Since last office visit she has gained 3 pounds.  She is drinking water and diet sodas.  She is going to PT 2 day per week for right ankle pain.    Pharmacotherapy for weight loss: She is not currently taking medications  for medical weight loss.     Previous pharmacotherapy for medical weight loss:  None   Bariatric surgery:  Patient has not had bariatric surgery  Pharmacotherapy for DMT2:   She  is taking Mounjaro  2.5mg , Farxiga, Metformin 1000 mg BID, Tresiba 70 units pm and Novolog 10 units BID and 5-10 units nightly based upon her BS readings. If 100 or below she will take 5 units or none.  Denies side effects.  Has appt with PCP in May for labs and follow up.  Notes her sensor is around 10 lower then when she checks her BS at home.  To discuss with PCP.  Saw cardiology last on 2/7 and PCP last on 2/10.      Last A1c was 6.2 on 2/7  CBGs: average 132 for the past 30 day-90-100 am.   Episodes of hypoglycemia: one in last 7 days during the night-denies any symptoms of hypoglycemia On ASA  81mg  and statin.  Last eye exam:  Dec 16th, 2024 She has tried Victoza in the past and stopped due to side effects.      Lab Results  Component Value Date   HGBA1C 6.0 10/04/2023   HGBA1C 6.2 (H) 02/23/2022   HGBA1C 6.3 (H) 10/09/2021   Lab Results  Component Value Date   LDLCALC 86 02/23/2022   CREATININE 1.0 01/05/2024      PHYSICAL EXAM:  Blood pressure 130/72, pulse 70, temperature 98.1 F (36.7 C), height 5\' 4"  (1.626 m), weight 295 lb (133.8 kg), SpO2 96%. Body mass index is 50.64 kg/m.  General: She is overweight, cooperative, alert, well developed, and in no acute distress. PSYCH: Has normal mood, affect and thought process.   Extremities: No edema.  Neurologic: No gross sensory or motor deficits. No tremors or fasciculations noted.    DIAGNOSTIC DATA REVIEWED:  BMET    Component Value Date/Time   NA 137 01/05/2024 0000   K 4.8 01/05/2024 0000   CL 102 01/05/2024 0000   CO2 21 01/05/2024 0000   GLUCOSE 88 02/23/2022 0953   BUN 37 (A) 01/05/2024 0000   CREATININE 1.0 01/05/2024 0000   CREATININE 1.00 02/23/2022 0953   CALCIUM  10.1 01/05/2024 0000   GFRNONAA 44 (L) 01/21/2021 1324   GFRAA 51 (  L) 01/21/2021 1324   Lab Results  Component Value Date   HGBA1C 6.0 10/04/2023   HGBA1C 6.3 (H) 01/28/2021   Lab Results  Component Value Date   INSULIN 3.5 02/23/2022   INSULIN 3.7 01/28/2021   Lab Results  Component Value Date   TSH 3.28 01/05/2024   CBC    Component Value Date/Time   WBC 8.8 09/17/2022 1515   RBC 4.93 09/17/2022 1515   HGB 14.3 09/17/2022 1515   HCT 43.0 09/17/2022 1515   PLT 324 09/17/2022 1515   MCV 87 09/17/2022 1515   MCH 29.0 09/17/2022 1515   MCHC 33.3 09/17/2022 1515   RDW 12.9 09/17/2022 1515   Iron Studies    Component Value Date/Time   FERRITIN 27 09/17/2022 1515   Lipid Panel     Component Value Date/Time   CHOL 132 10/04/2023 0835   CHOL 155 02/23/2022 0953   TRIG 115 10/04/2023 0835   HDL 48 10/04/2023 0835    HDL 47 02/23/2022 0953   LDLCALC 86 02/23/2022 0953   Hepatic Function Panel     Component Value Date/Time   PROT 6.7 02/23/2022 0953   ALBUMIN 4.2 01/05/2024 0000   ALBUMIN 4.2 02/23/2022 0953   AST 22 01/05/2024 0000   ALT 26 01/05/2024 0000   ALKPHOS 70 01/05/2024 0000   BILITOT 0.3 02/23/2022 0953      Component Value Date/Time   TSH 3.28 01/05/2024 0000   TSH 3.060 09/17/2022 1515   Nutritional Lab Results  Component Value Date   VD25OH 46.3 09/17/2022   VD25OH 51.6 02/23/2022   VD25OH 60.3 10/09/2021     ASSESSMENT AND PLAN  TREATMENT PLAN FOR OBESITY:  Recommended Dietary Goals  Bethany Lynch is currently in the action stage of change. As such, her goal is to continue weight management plan. She has agreed to the Category 2 Plan.  Behavioral Intervention  We discussed the following Behavioral Modification Strategies today: increasing lean protein intake to established goals, decreasing simple carbohydrates , increasing vegetables, increasing water intake , work on meal planning and preparation, reading food labels , and continue to work on maintaining a reduced calorie state, getting the recommended amount of protein, incorporating whole foods, making healthy choices, staying well hydrated and practicing mindfulness when eating..  Additional resources provided today: NA  Recommended Physical Activity Goals  Bethany Lynch has been advised to work up to 150 minutes of moderate intensity aerobic activity a week and strengthening exercises 2-3 times per week for cardiovascular health, weight loss maintenance and preservation of muscle mass.   She has agreed to keep going to PT.     ASSOCIATED CONDITIONS ADDRESSED TODAY  Action/Plan  Type 2 diabetes mellitus with other specified complication, with long-term current use of insulin (HCC) -     Tirzepatide ; Inject 2.5 mg into the skin once a week.  Dispense: 2 mL; Refill: 0  Keep appt with PCP.  To monitor for  hypoglycemia.  To let me or PCP know if has episodes of hypoglycemia.  Plans to discuss decreasing or stopping meal time insulin with PCP.  Would like to see about getting a new sensor.    Class 3 severe obesity due to excess calories with serious comorbidity and body mass index (BMI) of 50.0 to 59.9 in adult [facility])         Return in about 4 weeks (around 04/17/2024).Aaron Aas She was informed of the importance of frequent follow up visits to maximize her success with intensive lifestyle modifications  for her multiple health conditions.   ATTESTASTION STATEMENTS:  Reviewed by clinician on day of visit: allergies, medications, problem list, medical history, surgical history, family history, social history, and previous encounter notes.     Crist Dominion. Issa Luster FNP-C

## 2024-03-22 ENCOUNTER — Other Ambulatory Visit: Payer: Self-pay | Admitting: Cardiology

## 2024-04-06 ENCOUNTER — Other Ambulatory Visit: Payer: Self-pay | Admitting: Bariatrics

## 2024-04-06 DIAGNOSIS — F5089 Other specified eating disorder: Secondary | ICD-10-CM

## 2024-04-06 LAB — LAB REPORT - SCANNED
A1c: 6
EGFR: 60

## 2024-04-17 ENCOUNTER — Encounter: Payer: Self-pay | Admitting: Nurse Practitioner

## 2024-04-17 ENCOUNTER — Ambulatory Visit: Admitting: Nurse Practitioner

## 2024-04-17 VITALS — BP 136/68 | HR 64 | Temp 98.1°F | Ht 64.0 in | Wt 289.0 lb

## 2024-04-17 DIAGNOSIS — F5089 Other specified eating disorder: Secondary | ICD-10-CM | POA: Diagnosis not present

## 2024-04-17 DIAGNOSIS — E66813 Obesity, class 3: Secondary | ICD-10-CM

## 2024-04-17 DIAGNOSIS — Z794 Long term (current) use of insulin: Secondary | ICD-10-CM

## 2024-04-17 DIAGNOSIS — Z6841 Body Mass Index (BMI) 40.0 and over, adult: Secondary | ICD-10-CM | POA: Diagnosis not present

## 2024-04-17 DIAGNOSIS — Z7984 Long term (current) use of oral hypoglycemic drugs: Secondary | ICD-10-CM

## 2024-04-17 DIAGNOSIS — E1169 Type 2 diabetes mellitus with other specified complication: Secondary | ICD-10-CM

## 2024-04-17 DIAGNOSIS — Z7985 Long-term (current) use of injectable non-insulin antidiabetic drugs: Secondary | ICD-10-CM

## 2024-04-17 MED ORDER — BUPROPION HCL ER (SR) 150 MG PO TB12: 150.0000 mg | ORAL_TABLET | Freq: Every day | ORAL | 0 refills | Status: DC

## 2024-04-17 MED ORDER — TIRZEPATIDE 2.5 MG/0.5ML ~~LOC~~ SOAJ: 2.5000 mg | SUBCUTANEOUS | 0 refills | Status: DC

## 2024-04-17 NOTE — Progress Notes (Signed)
 Office: [phone]  /  Fax: [phone]  WEIGHT SUMMARY AND BIOMETRICS  Weight Lost Since Last Visit: 6lb  Weight Gained Since Last Visit: 0lb   Vitals Temp: 98.1 F (36.7 C) BP: 136/68 Pulse Rate: 64 SpO2: 95 %   Anthropometric Measurements Height: 5\' 4"  (1.626 m) Weight: 289 lb (131.1 kg) BMI (Calculated): 49.58 Weight at Last Visit: 295lb Weight Lost Since Last Visit: 6lb Weight Gained Since Last Visit: 0lb Starting Weight: 339lb Total Weight Loss (lbs): 50 lb (22.7 kg)   Body Composition  Body Fat %: 58 % Fat Mass (lbs): 168 lbs Muscle Mass (lbs): 115.6 lbs Visceral Fat Rating : 23   Other Clinical Data Fasting: No Labs: No Today's Visit #: 44 Starting Date: 01/28/21     HPI  Chief Complaint: OBESITY  Bethany Lynch is here to discuss her progress with her obesity treatment plan. She is on the the Category 2 Plan and states she is following her eating plan approximately 60 % of the time. She states she is exercising 45 minutes 1 days per week.   Interval History:  Since last office visit she has lost 6 pounds. She is not skipping meals.  She is snacking on protein bars and fruits. She finished PT last week.  Went to PT 2 days per week for one month.  Denies polyphagia and cravings.      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  Denies side effects.     Previous pharmacotherapy for medical weight loss:  None   Bariatric surgery:  Patient has not had bariatric surgery  Pharmacotherapy for DMT2:   She is currently taking Mounjaro  2.5mg , Farxiga, Metformin 1000 mg BID, Tresiba 70 units pm and Novolog 5 units TID.  Denies side effects.   Saw cardiology last on 2/7 and PCP last on 04/10/24      Last A1c was 6 on 04/05/24 CBGs:  average 124 Episodes of hypoglycemia:  four in the last 30 days-occurs around 6am On ASA 81mg  and statin.  Last eye exam:  Dec 16th, 2024 She has tried Victoza in the past  and stopped due to side effects.  Lab Results  Component Value Date   HGBA1C 6.0 10/04/2023   HGBA1C 6.2 (H) 02/23/2022   HGBA1C 6.3 (H) 10/09/2021   Lab Results  Component Value Date   LDLCALC 86 02/23/2022   CREATININE 1.0 01/05/2024      PHYSICAL EXAM:  Blood pressure 136/68, pulse 64, temperature 98.1 F (36.7 C), height 5\' 4"  (1.626 m), weight 289 lb (131.1 kg), SpO2 95%. Body mass index is 49.61 kg/m.  General: She is overweight, cooperative, alert, well developed, and in no acute distress. PSYCH: Has normal mood, affect and thought process.   Extremities: No edema.  Neurologic: No gross sensory or motor deficits. No tremors or fasciculations noted.    DIAGNOSTIC DATA REVIEWED:  BMET    Component Value Date/Time   NA 137 01/05/2024 0000   K 4.8 01/05/2024 0000   CL 102 01/05/2024 0000   CO2 21 01/05/2024 0000   GLUCOSE 88 02/23/2022 0953   BUN 37 (A) 01/05/2024 0000   CREATININE 1.0 01/05/2024 0000   CREATININE 1.00 02/23/2022 0953   CALCIUM  10.1 01/05/2024 0000   GFRNONAA 44 (L) 01/21/2021 1324   GFRAA 51 (L) 01/21/2021 1324   Lab Results  Component Value Date   HGBA1C 6.0 10/04/2023   HGBA1C 6.3 (H) 01/28/2021   Lab Results  Component Value Date   INSULIN 3.5 02/23/2022   INSULIN 3.7 01/28/2021   Lab Results  Component Value Date   TSH 3.28 01/05/2024   CBC    Component Value Date/Time   WBC 8.8 09/17/2022 1515   RBC 4.93 09/17/2022 1515   HGB 14.3 09/17/2022 1515   HCT 43.0 09/17/2022 1515   PLT 324 09/17/2022 1515   MCV 87 09/17/2022 1515   MCH 29.0 09/17/2022 1515   MCHC 33.3 09/17/2022 1515   RDW 12.9 09/17/2022 1515   Iron Studies    Component Value Date/Time   FERRITIN 27 09/17/2022 1515   Lipid Panel     Component Value Date/Time   CHOL 132 10/04/2023 0835   CHOL 155 02/23/2022 0953   TRIG 115 10/04/2023 0835   HDL 48 10/04/2023 0835   HDL 47 02/23/2022 0953   LDLCALC 86 02/23/2022 0953   Hepatic Function Panel      Component Value Date/Time   PROT 6.7 02/23/2022 0953   ALBUMIN 4.2 01/05/2024 0000   ALBUMIN 4.2 02/23/2022 0953   AST 22 01/05/2024 0000   ALT 26 01/05/2024 0000   ALKPHOS 70 01/05/2024 0000   BILITOT 0.3 02/23/2022 0953      Component Value Date/Time   TSH 3.28 01/05/2024 0000   TSH 3.060 09/17/2022 1515   Nutritional Lab Results  Component Value Date   VD25OH 46.3 09/17/2022   VD25OH 51.6 02/23/2022   VD25OH 60.3 10/09/2021     ASSESSMENT AND PLAN  TREATMENT PLAN FOR OBESITY:  Recommended Dietary Goals  Bethany Lynch is currently in the action stage of change. As such, her goal is to continue weight management plan. She has agreed to the Category 2 Plan.  Behavioral Intervention  We discussed the following Behavioral Modification Strategies today: increasing lean protein intake to established goals, decreasing simple carbohydrates , increasing vegetables, increasing fiber rich foods, increasing water intake , work on meal planning and preparation, and continue to work on maintaining a reduced calorie state, getting the recommended amount of protein, incorporating whole foods, making healthy choices, staying well hydrated and practicing mindfulness when eating..  Additional resources provided today: NA  Recommended Physical Activity Goals  Bethany Lynch has been advised to work up to 150 minutes of moderate intensity aerobic activity a week and strengthening exercises 2-3 times per week for cardiovascular health, weight loss maintenance and preservation of muscle mass.   She has agreed to Think about enjoyable ways to increase daily physical activity and overcoming barriers to exercise, Increase physical activity in their day and reduce sedentary time (increase NEAT)., and Work on scheduling and tracking physical activity.   Plans to start going back to water aerobics.    ASSOCIATED CONDITIONS ADDRESSED TODAY  Action/Plan  Type 2 diabetes mellitus with other specified  complication, with long-term current use of insulin (HCC) -     Continue Tirzepatide ; Inject 2.5 mg into the skin once a week.  Dispense: 2 mL; Refill: 0. Side effects discussed.   Other disorder of eating -     Continue buPROPion  HCl ER (SR); Take 1 tablet (150 mg total) by mouth daily.  Dispense: 90 tablet; Refill: 0.  Side effects discussed.   Class 3 severe obesity due to excess calories with body mass index (BMI) of 45.0 to 49.9 in adult         Return in about 4 weeks (around 05/15/2024).Bethany Lynch She was informed of the importance of frequent follow up visits to maximize her success with intensive  lifestyle modifications for her multiple health conditions.   ATTESTASTION STATEMENTS:  Reviewed by clinician on day of visit: allergies, medications, problem list, medical history, surgical history, family history, social history, and previous encounter notes.    Crist Dominion. Bethany Foye FNP-C

## 2024-04-26 ENCOUNTER — Telehealth: Payer: Self-pay

## 2024-04-26 NOTE — Telephone Encounter (Signed)
 Appointment has been scheduled for June 2 with Steele Edelson, FNP.

## 2024-04-26 NOTE — Telephone Encounter (Signed)
 Copied from CRM [id number]. Topic: Appointments - Scheduling Inquiry for Clinic >> Apr 25, 2024  2:51 PM Rosaria Common wrote: Reason for CRM: Pt calling to establish new care with [facility] 3 providers are accepting new pts at this time, but calendar is not showing any open dates. Callback number is [phone].

## 2024-05-01 ENCOUNTER — Ambulatory Visit: Admitting: Family Medicine

## 2024-05-01 ENCOUNTER — Encounter: Payer: Self-pay | Admitting: Family Medicine

## 2024-05-01 VITALS — BP 136/60 | HR 68 | Temp 98.0°F | Resp 16 | Ht 64.0 in | Wt 300.6 lb

## 2024-05-01 DIAGNOSIS — I152 Hypertension secondary to endocrine disorders: Secondary | ICD-10-CM

## 2024-05-01 DIAGNOSIS — F419 Anxiety disorder, unspecified: Secondary | ICD-10-CM | POA: Diagnosis not present

## 2024-05-01 DIAGNOSIS — I6522 Occlusion and stenosis of left carotid artery: Secondary | ICD-10-CM

## 2024-05-01 DIAGNOSIS — Z780 Asymptomatic menopausal state: Secondary | ICD-10-CM

## 2024-05-01 DIAGNOSIS — Z13820 Encounter for screening for osteoporosis: Secondary | ICD-10-CM

## 2024-05-01 DIAGNOSIS — M722 Plantar fascial fibromatosis: Secondary | ICD-10-CM

## 2024-05-01 DIAGNOSIS — I890 Lymphedema, not elsewhere classified: Secondary | ICD-10-CM | POA: Diagnosis not present

## 2024-05-01 DIAGNOSIS — E1159 Type 2 diabetes mellitus with other circulatory complications: Secondary | ICD-10-CM

## 2024-05-01 MED ORDER — LORAZEPAM 0.5 MG PO TABS: 0.5000 mg | ORAL_TABLET | Freq: Two times a day (BID) | ORAL | 2 refills | Status: DC | PRN

## 2024-05-01 NOTE — Progress Notes (Signed)
 New Patient Office Visit  Subjective    Patient ID: Bethany Lynch, female    DOB: 1951/05/27  Age: 73 y.o. MRN: [id number]  CC:  Chief Complaint  Patient presents with   Establish Care    DIABETES HYPERTENTION   Hypertension   Discussed the use of AI scribe software for clinical note transcription with the patient, who gave verbal consent to proceed.  History of Present Illness   Bethany Lynch is a 73 year old female who presents for a new patient visit and medication management.  She is managing hypertension with hydralazine and a chewable aspirin. She experiences leg swelling, which was evaluated with an ultrasound last June, showing no blood clots. A vein specialist diagnosed venous insufficiency and treated varicose veins. She uses compression stockings, pumps, and receives lymphatic massages to manage symptoms.  She has type 2 diabetes diagnosed at age 46, currently managed with metformin 1000 mg twice daily, Mounjaro , and insulin (NovoLog 5 units per meal and Tresiba 70 units daily). She has lost about 50 pounds over the past three years and is involved in a weight management program. Her last A1c was 6.0 in May. She experiences gastrointestinal side effects from metformin and Mounjaro .  She has a history of gastroesophageal reflux disease, managed with Nexium and Zofran . 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  She reports headaches since childhood, associated with blood pressure. She has worn glasses since first grade due to vision issues and experiences occasional blurry vision, advised to use reading glasses with a prism .  Her family history includes diabetes affecting her mother, grandfather, and aunts, and colon cancer in her mother, prompting regular colonoscopies, the last two years ago.  She experiences anxiety and is prescribed lorazepam. She reports stress, particularly when caring for her mother in a nursing  home.  She has a history of glaucoma and has undergone laser treatment.       Outpatient Encounter Medications as of 05/01/2024  Medication Sig   amLODipine  (NORVASC ) 5 MG tablet Take 1 tablet (5 mg total) by mouth daily.   aspirin 81 MG chewable tablet Chew 81 mg by mouth daily.   buPROPion  (WELLBUTRIN  SR) 150 MG 12 hr tablet Take 1 tablet (150 mg total) by mouth daily.   cetirizine (ZYRTEC) 10 MG tablet Take 10 mg by mouth daily.   Cholecalciferol 50 MCG (2000 UT) CAPS Take 1 capsule by mouth daily as needed (Patient determines when to take).   dicyclomine (BENTYL) 20 MG tablet Take 20 mg by mouth every 6 (six) hours. PRN   doxycycline (PERIOSTAT) 20 MG tablet Take 20 mg by mouth 2 (two) times daily.   esomeprazole (NEXIUM) 40 MG capsule Take 40 mg by mouth daily.   FARXIGA 10 MG TABS tablet Take 10 mg by mouth daily.   furosemide  (LASIX ) 40 MG tablet Take 1 tablet (40 mg total) by mouth daily.   gabapentin (NEURONTIN) 100 MG capsule Take 100 mg by mouth 3 (three) times daily as needed (nerve pain).   hydrALAZINE (APRESOLINE) 50 MG tablet Take 1 tablet by mouth 3 (three) times daily.   metFORMIN (GLUCOPHAGE) 500 MG tablet Take 1,000 mg by mouth 2 (two) times daily.   Misc Natural Products (TART CHERRY ADVANCED PO) Take 1 tablet by mouth daily as needed (Patient determines when to take).   Multiple Vitamin (MULTIVITAMIN) tablet Take 1 tablet by mouth daily.   Multiple Vitamins-Minerals (VISION PLUS PO) Take 1 tablet by mouth  daily.   NON FORMULARY Take 1 tablet by mouth See admin instructions. ORGANIC TUMERIC CURCUMIN  WITH GINGER AND BIOPERINE DIETARY SUPPLEMENT TAKE AS NEEDED   NOVOLOG FLEXPEN 100 UNIT/ML FlexPen Inject 5 Units into the muscle 3 (three) times daily with meals.   Omega-3 Fatty Acids (FISH OIL PO) Take 1,400 mg by mouth daily.   OVER THE COUNTER MEDICATION Take 1 tablet by mouth as needed (loose stools).   rosuvastatin  (CRESTOR ) 10 MG tablet Take 1 tablet (10 mg total) by  mouth daily.   Simethicone (GAS-X PO) Take 1 tablet by mouth as needed (Gas).   spironolactone (ALDACTONE) 25 MG tablet Take 25 mg by mouth daily. with food   telmisartan (MICARDIS) 80 MG tablet Take 1 tablet by mouth daily.   tirzepatide  (MOUNJARO ) 2.5 MG/0.5ML Pen Inject 2.5 mg into the skin once a week.   TRESIBA FLEXTOUCH 200 UNIT/ML SOPN Inject 70 Units into the skin at bedtime.   vitamin E 400 UNIT capsule Take 400 Units by mouth daily.   zinc gluconate 50 MG tablet Take 50 mg by mouth daily.   [DISCONTINUED] LORazepam (ATIVAN) 0.5 MG tablet Take 0.5 mg by mouth 2 (two) times daily as needed for anxiety.   LORazepam (ATIVAN) 0.5 MG tablet Take 1 tablet (0.5 mg total) by mouth 2 (two) times daily as needed for anxiety.   ondansetron  (ZOFRAN ) 4 MG tablet Take 1 tablet (4 mg total) by mouth every 8 (eight) hours as needed for nausea or vomiting. (Patient not taking: Reported on 05/01/2024)   [DISCONTINUED] calcium -vitamin D  (OSCAL WITH D) 500-200 MG-UNIT tablet Take 2 tablets by mouth daily with breakfast.   [DISCONTINUED] Pediatric Multivitamins-Fl (MULTIVITAMIN DROPS/FLUORIDE PO) Take 1 tablet by mouth daily.   No facility-administered encounter medications on file as of 05/01/2024.    Past Medical History:  Diagnosis Date   Anxiety    Aortic stenosis 10/19/2019   Arthritis    Back pain    CFS (chronic fatigue syndrome)    Constipation    Diabetes (HCC)    Edema of both lower extremities    Essential hypertension 09/28/2018   GERD (gastroesophageal reflux disease)    Glaucoma    Hyperlipidemia 09/28/2018   IBS (irritable bowel syndrome)    Joint pain    Low energy    Lymphedema of lower extremity 01/02/2019   Palpitations    Shortness of breath 09/28/2018   SOB (shortness of breath)    Stenosis of left carotid artery 09/28/2018   Swallowing difficulty     Past Surgical History:  Procedure Laterality Date   GALLBLADDER SURGERY  07/27/1988   HERNIA REPAIR  04/04/2012    HERNIA REPAIR  09/09/2015   HYESTERECTOMY  02/06/1991    Family History  Problem Relation Age of Onset   Memory loss Mother    Diabetes Mother    Colon cancer Mother    Diabetes type II Mother    High blood pressure Mother    High Cholesterol Mother    Obesity Mother    Stroke Father    Leukemia Father    High blood pressure Father    High Cholesterol Father    Heart disease Father    Sleep apnea Father    Obesity Father     Social History   Socioeconomic History   Marital status: Married    Spouse name: Vallorie Gayer   Number of children: Not on file   Years of education: Not on file   Highest education  level: Not on file  Occupational History   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  Tobacco Use   Smoking status: Never   Smokeless tobacco: Never  Vaping Use   Vaping status: Never Used  Substance and Sexual Activity   Alcohol use: Never   Drug use: Never   Sexual activity: Not Currently  Other Topics Concern   Not on file  Social History Narrative   Not on file   Social Drivers of Health   Financial Resource Strain: Not on file  Food Insecurity: Not on file  Transportation Needs: Not on file  Physical Activity: Not on file  Stress: Not on file  Social Connections: Not on file  Intimate Partner Violence: Not on file    Review of Systems  Constitutional:  Negative for chills, fever and malaise/fatigue.  HENT:  Negative for congestion, ear pain, sinus pain and sore throat.   Eyes:  Positive for blurred vision.  Respiratory:  Negative for cough, shortness of breath and wheezing.   Cardiovascular:  Positive for leg swelling. Negative for chest pain and palpitations.  Gastrointestinal:  Negative for constipation, diarrhea, nausea and vomiting.  Genitourinary:  Negative for dysuria, frequency and urgency.  Musculoskeletal: Negative.   Skin: Negative.   Neurological:  Positive for headaches (occasional). Negative for dizziness.   Endo/Heme/Allergies: Negative.   Psychiatric/Behavioral: Negative.       Objective    BP 136/60   Pulse 68   Temp 98 F (36.7 C) (Temporal)   Resp 16   Ht 5\' 4"  (1.626 m)   Wt (!) 300 lb 9.6 oz (136.4 kg)   SpO2 95%   BMI 51.60 kg/m   Physical Exam Constitutional:      General: She is not in acute distress.    Appearance: Normal appearance. She is obese. She is not ill-appearing.  Eyes:     Conjunctiva/sclera: Conjunctivae normal.  Cardiovascular:     Rate and Rhythm: Normal rate and regular rhythm.     Heart sounds: Murmur heard.  Pulmonary:     Effort: Pulmonary effort is normal.     Breath sounds: Normal breath sounds. No wheezing.  Musculoskeletal:        General: Normal range of motion.  Skin:    General: Skin is warm.  Neurological:     Mental Status: She is alert. Mental status is at baseline.  Psychiatric:        Mood and Affect: Mood normal.        Behavior: Behavior normal.         Assessment & Plan:   Problem List Items Addressed This Visit       Cardiovascular and Mediastinum   Hypertension associated with type 2 diabetes mellitus (HCC)   Type 2 Diabetes Mellitus Long-standing Type 2 Diabetes Mellitus with improved glucose control. Gastrointestinal side effects from metformin and Mounjaro  noted. Lab Results  Component Value Date   HGBA1C 6.0 10/04/2023   - Continue metformin 1000 mg twice daily. - Continue Mounjaro  at 2.5 mg. - Continue NovoLog at 5 units per meal and Tresiba at 70 units daily. - Consider reducing metformin dose if gastrointestinal symptoms persist. - Refer to diabetes educator for medication and diet management.  Hypertension Blood pressure well-controlled with current regimen. BP Readings from Last 3 Encounters:  05/01/24 136/60  04/17/24 136/68  03/20/24 130/72   - Continue amlodipine  and hydralazine.       Stenosis of left carotid artery   Heart murmur with  mild left carotid stenosis and mitral valve  regurgitation. Recent echocardiogram shows improvement. - Continue regular cardiology follow-ups.        Musculoskeletal and Integument   Plantar fasciitis   Plantar fasciitis managed with orthotics and exercises. - Continue home exercises. - Use ankle brace as needed.        Other   Lymphedema of lower extremity   Chronic lymphedema with symptom improvement under current management. - Continue compression stockings and pneumatic compression pumps. - Continue lymphatic massage therapy.      Anxiety - Primary   Anxiety managed with lorazepam. Stress related to caregiving noted. - Continue lorazepam. - Consider discussing stress management strategies in future visits.      Relevant Medications   LORazepam (ATIVAN) 0.5 MG tablet   Encounter for osteoporosis screening in asymptomatic postmenopausal patient   Relevant Orders   DG BONE DENSITY (DXA)      Follow-up: Return in about 2 months (around 07/07/2024) for chronic.   Delford Felling, FNP [facility] [phone]

## 2024-05-01 NOTE — Assessment & Plan Note (Signed)
 Heart murmur with mild left carotid stenosis and mitral valve regurgitation. Recent echocardiogram shows improvement. - Continue regular cardiology follow-ups.

## 2024-05-01 NOTE — Assessment & Plan Note (Signed)
 Plantar fasciitis managed with orthotics and exercises. - Continue home exercises. - Use ankle brace as needed.

## 2024-05-01 NOTE — Assessment & Plan Note (Signed)
 Type 2 Diabetes Mellitus Long-standing Type 2 Diabetes Mellitus with improved glucose control. Gastrointestinal side effects from metformin and Mounjaro  noted. Lab Results  Component Value Date   HGBA1C 6.0 10/04/2023   - Continue metformin 1000 mg twice daily. - Continue Mounjaro  at 2.5 mg. - Continue NovoLog at 5 units per meal and Tresiba at 70 units daily. - Consider reducing metformin dose if gastrointestinal symptoms persist. - Refer to diabetes educator for medication and diet management.  Hypertension Blood pressure well-controlled with current regimen. BP Readings from Last 3 Encounters:  05/01/24 136/60  04/17/24 136/68  03/20/24 130/72   - Continue amlodipine  and hydralazine.

## 2024-05-01 NOTE — Assessment & Plan Note (Signed)
 Anxiety managed with lorazepam. Stress related to caregiving noted. - Continue lorazepam. - Consider discussing stress management strategies in future visits.

## 2024-05-01 NOTE — Assessment & Plan Note (Signed)
 Chronic GERD with history of gastric erosion. Gastrointestinal symptoms possibly exacerbated by diabetes medications. - Continue Nexium. - Adjust diabetes medications if gastrointestinal symptoms persist.

## 2024-05-01 NOTE — Assessment & Plan Note (Signed)
 Chronic lymphedema with symptom improvement under current management. - Continue compression stockings and pneumatic compression pumps. - Continue lymphatic massage therapy.

## 2024-05-03 ENCOUNTER — Ambulatory Visit: Payer: Self-pay | Admitting: Family Medicine

## 2024-05-03 ENCOUNTER — Ambulatory Visit (INDEPENDENT_AMBULATORY_CARE_PROVIDER_SITE_OTHER)
Admission: RE | Admit: 2024-05-03 | Discharge: 2024-05-03 | Disposition: A | Discharge status: Home / Self Care | Source: Ambulatory Visit | Attending: Family Medicine | Admitting: Family Medicine

## 2024-05-03 DIAGNOSIS — Z13820 Encounter for screening for osteoporosis: Secondary | ICD-10-CM | POA: Diagnosis not present

## 2024-05-03 DIAGNOSIS — Z780 Asymptomatic menopausal state: Secondary | ICD-10-CM | POA: Diagnosis not present

## 2024-05-16 ENCOUNTER — Encounter: Payer: Self-pay | Admitting: Cardiology

## 2024-05-16 ENCOUNTER — Ambulatory Visit (INDEPENDENT_AMBULATORY_CARE_PROVIDER_SITE_OTHER): Admitting: Nurse Practitioner

## 2024-05-16 ENCOUNTER — Telehealth: Payer: Self-pay | Admitting: Family Medicine

## 2024-05-16 ENCOUNTER — Encounter: Payer: Self-pay | Admitting: Nurse Practitioner

## 2024-05-16 VITALS — BP 129/69 | HR 68 | Temp 98.6°F | Ht 64.0 in | Wt 290.0 lb

## 2024-05-16 DIAGNOSIS — E1159 Type 2 diabetes mellitus with other circulatory complications: Secondary | ICD-10-CM | POA: Diagnosis not present

## 2024-05-16 DIAGNOSIS — Z6841 Body Mass Index (BMI) 40.0 and over, adult: Secondary | ICD-10-CM

## 2024-05-16 DIAGNOSIS — I152 Hypertension secondary to endocrine disorders: Secondary | ICD-10-CM | POA: Diagnosis not present

## 2024-05-16 DIAGNOSIS — E782 Mixed hyperlipidemia: Secondary | ICD-10-CM | POA: Diagnosis not present

## 2024-05-16 DIAGNOSIS — E66813 Obesity, class 3: Secondary | ICD-10-CM

## 2024-05-16 DIAGNOSIS — Z7984 Long term (current) use of oral hypoglycemic drugs: Secondary | ICD-10-CM

## 2024-05-16 DIAGNOSIS — Z7985 Long-term (current) use of injectable non-insulin antidiabetic drugs: Secondary | ICD-10-CM

## 2024-05-16 DIAGNOSIS — Z794 Long term (current) use of insulin: Secondary | ICD-10-CM

## 2024-05-16 DIAGNOSIS — E1169 Type 2 diabetes mellitus with other specified complication: Secondary | ICD-10-CM

## 2024-05-16 MED ORDER — TIRZEPATIDE 2.5 MG/0.5ML ~~LOC~~ SOAJ: 2.5000 mg | SUBCUTANEOUS | 0 refills | Status: DC

## 2024-05-16 NOTE — Progress Notes (Signed)
 Office: [phone]  /  Fax: [phone]  WEIGHT SUMMARY AND BIOMETRICS  Weight Lost Since Last Visit: 0  Weight Gained Since Last Visit: 1lb   Vitals Temp: 98.6 F (37 C) BP: 129/69 Pulse Rate: 68 SpO2: 97 %   Anthropometric Measurements Height: 5' 4 (1.626 m) Weight: 290 lb (131.5 kg) BMI (Calculated): 49.75 Weight at Last Visit: 289lb Weight Lost Since Last Visit: 0 Weight Gained Since Last Visit: 1lb Starting Weight: 339lb Total Weight Loss (lbs): 49 lb (22.2 kg)   Body Composition  Body Fat %: 58.7 % Fat Mass (lbs): 170.4 lbs Muscle Mass (lbs): 113.8 lbs Visceral Fat Rating : 24   Other Clinical Data Fasting: no Labs: no Today's Visit #: 45 Starting Date: 01/28/21     HPI  Chief Complaint: OBESITY  Bethany Lynch is here to discuss her progress with her obesity treatment plan. She is on the the Category 2 Plan and states she is following her eating plan approximately 40 % of the time. She states she is exercising 10 minutes 3-4 days per week.   Interval History:  Since last office visit she has gained 1 pound.  She was on vacation/camp last week.  She notes she got off track and is planning to get back on track.  She is drinking water and diet sodas.    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  Denies side effects.     Previous pharmacotherapy for medical weight loss:  None   Bariatric surgery:  Patient has not had bariatric surgery  Pharmacotherapy for DMT2:   She is taking Mounjaro  2.5mg , Farxiga, Metformin 1000 mg BID, Tresiba 70 units pm and Novolog 5-10 units TID.  Reports side effects GI upset and nausea.   Saw cardiology last on 2/7 and PCP last on 05/01/24      Last A1c was 6 on 04/05/24 CBGs:  78-150s, highest 300 Episodes of hypoglycemia: none On ASA 81mg  and statin.  Last eye exam:  Dec 16th, 2024 She has tried Victoza in the past and stopped due to side effects.  Saw PCP last  on 05/01/24 and has been referred to diabetic educator.      Lab Results  Component Value Date   HGBA1C 6.0 10/04/2023   HGBA1C 6.2 (H) 02/23/2022   HGBA1C 6.3 (H) 10/09/2021   Lab Results  Component Value Date   LDLCALC 86 02/23/2022   CREATININE 1.0 01/05/2024     Hyperlipidemia Medication(s): Crestor  10mg . Reports leg pain.  Took Lipitor and Zocor in the past and stopped due to side effects of leg pain.   Cardiovascular risk factors: diabetes mellitus, dyslipidemia, hypertension, obesity (BMI >= 30 kg/m2), and sedentary lifestyle  Lab Results  Component Value Date   CHOL 132 10/04/2023   HDL 48 10/04/2023   LDLCALC 86 02/23/2022   TRIG 115 10/04/2023   Lab Results  Component Value Date   ALT 26 01/05/2024   AST 22 01/05/2024   ALKPHOS 70 01/05/2024   BILITOT 0.3 02/23/2022   The 10-year ASCVD risk score (Arnett DK, et al., 2019) is: 23.7%*   Values used to calculate the score:     Age: 73 years     Clincally relevant sex: Female     Is Non-Hispanic African American: No     Diabetic: Yes     Tobacco smoker: No     Systolic Blood Pressure: 129 mmHg     Is BP treated: Yes  HDL Cholesterol: 48 mg/dL*     Total Cholesterol: 132 mg/dL*     * - Cholesterol units were assumed for this score calculation   PHYSICAL EXAM:  Blood pressure 129/69, pulse 68, temperature 98.6 F (37 C), height 5' 4 (1.626 m), weight 290 lb (131.5 kg), SpO2 97%. Body mass index is 49.78 kg/m.  General: She is overweight, cooperative, alert, well developed, and in no acute distress. PSYCH: Has normal mood, affect and thought process.   Extremities: No edema.  Neurologic: No gross sensory or motor deficits. No tremors or fasciculations noted.    DIAGNOSTIC DATA REVIEWED:  BMET    Component Value Date/Time   NA 137 01/05/2024 0000   K 4.8 01/05/2024 0000   CL 102 01/05/2024 0000   CO2 21 01/05/2024 0000   GLUCOSE 88 02/23/2022 0953   BUN 37 (A) 01/05/2024 0000   CREATININE 1.0  01/05/2024 0000   CREATININE 1.00 02/23/2022 0953   CALCIUM  10.1 01/05/2024 0000   GFRNONAA 44 (L) 01/21/2021 1324   GFRAA 51 (L) 01/21/2021 1324   Lab Results  Component Value Date   HGBA1C 6.0 10/04/2023   HGBA1C 6.3 (H) 01/28/2021   Lab Results  Component Value Date   INSULIN 3.5 02/23/2022   INSULIN 3.7 01/28/2021   Lab Results  Component Value Date   TSH 3.28 01/05/2024   CBC    Component Value Date/Time   WBC 8.8 09/17/2022 1515   RBC 4.93 09/17/2022 1515   HGB 14.3 09/17/2022 1515   HCT 43.0 09/17/2022 1515   PLT 324 09/17/2022 1515   MCV 87 09/17/2022 1515   MCH 29.0 09/17/2022 1515   MCHC 33.3 09/17/2022 1515   RDW 12.9 09/17/2022 1515   Iron Studies    Component Value Date/Time   FERRITIN 27 09/17/2022 1515   Lipid Panel     Component Value Date/Time   CHOL 132 10/04/2023 0835   CHOL 155 02/23/2022 0953   TRIG 115 10/04/2023 0835   HDL 48 10/04/2023 0835   HDL 47 02/23/2022 0953   LDLCALC 86 02/23/2022 0953   Hepatic Function Panel     Component Value Date/Time   PROT 6.7 02/23/2022 0953   ALBUMIN 4.2 01/05/2024 0000   ALBUMIN 4.2 02/23/2022 0953   AST 22 01/05/2024 0000   ALT 26 01/05/2024 0000   ALKPHOS 70 01/05/2024 0000   BILITOT 0.3 02/23/2022 0953      Component Value Date/Time   TSH 3.28 01/05/2024 0000   TSH 3.060 09/17/2022 1515   Nutritional Lab Results  Component Value Date   VD25OH 46.3 09/17/2022   VD25OH 51.6 02/23/2022   VD25OH 60.3 10/09/2021   Hypertension Hypertension stable.  Medication(s): Norvasc  5mg , ASA 81mg , lasix  40mg , hydralazine 50mg , micardis 80mg .  Denies side effects.   Had an episode today of tachycardia 136 around 7:30 am.  Unsure of how long it lasted.  She took an ASA81mg .  Noted some chest pain-discomfort without radiation during the episode.  Denies SHOB.  Saw cardiology last 01/07/24.  Last echo was 01/28/24  BP Readings from Last 3 Encounters:  05/16/24 129/69  05/01/24 136/60  04/17/24 136/68    Lab Results  Component Value Date   CREATININE 1.0 01/05/2024   CREATININE 1.0 10/04/2023   CREATININE 1.00 02/23/2022     ASSESSMENT AND PLAN  TREATMENT PLAN FOR OBESITY:  Recommended Dietary Goals  [name] is currently in the action stage of change. As such, her goal is to continue weight management  plan. She has agreed to the Category 2 Plan.  Behavioral Intervention  We discussed the following Behavioral Modification Strategies today: increasing lean protein intake to established goals, decreasing simple carbohydrates , increasing vegetables, increasing fiber rich foods, increasing water intake , and continue to work on maintaining a reduced calorie state, getting the recommended amount of protein, incorporating whole foods, making healthy choices, staying well hydrated and practicing mindfulness when eating..  Additional resources provided today: NA  Recommended Physical Activity Goals  Tyliah has been advised to work up to 150 minutes of moderate intensity aerobic activity a week and strengthening exercises 2-3 times per week for cardiovascular health, weight loss maintenance and preservation of muscle mass.   She has agreed to Think about enjoyable ways to increase daily physical activity and overcoming barriers to exercise, Increase physical activity in their day and reduce sedentary time (increase NEAT)., and Work on scheduling and tracking physical activity.     ASSOCIATED CONDITIONS ADDRESSED TODAY  Action/Plan  Type 2 diabetes mellitus with other specified complication, with long-term current use of insulin (HCC) -     Tirzepatide ; Inject 2.5 mg into the skin once a week.  Dispense: 2 mL; Refill: 0  Reduce Meformin 500mg  BID to see if will help with GI symptoms.   To contact me next week to let me know how she is doing. To continue monitors blood sugars.    Mixed hyperlipidemia To reach out to cardiology concerning side effects to Crestor   Hypertension  associated with type 2 diabetes mellitus (HCC) To reach out to cardiology to sched an appt for tachycardia and HLD.    Message sent to cardiology while in office  Class 3 severe obesity due to excess calories with body mass index (BMI) of 45.0 to 49.9 in adult         Return in about 4 weeks (around 06/13/2024).Aaron Aas She was informed of the importance of frequent follow up visits to maximize her success with intensive lifestyle modifications for her multiple health conditions.   ATTESTASTION STATEMENTS:  Reviewed by clinician on day of visit: allergies, medications, problem list, medical history, surgical history, family history, social history, and previous encounter notes.     Crist Dominion. Lalania Haseman FNP-C

## 2024-05-16 NOTE — Telephone Encounter (Signed)
 [facility]

## 2024-05-18 ENCOUNTER — Ambulatory Visit: Admitting: Nurse Practitioner

## 2024-05-23 ENCOUNTER — Encounter: Payer: Self-pay | Admitting: Family Medicine

## 2024-05-23 ENCOUNTER — Ambulatory Visit (INDEPENDENT_AMBULATORY_CARE_PROVIDER_SITE_OTHER): Admitting: Family Medicine

## 2024-05-23 VITALS — BP 138/50 | HR 71 | Temp 97.5°F | Resp 16 | Ht 64.0 in | Wt 295.0 lb

## 2024-05-23 DIAGNOSIS — I152 Hypertension secondary to endocrine disorders: Secondary | ICD-10-CM

## 2024-05-23 DIAGNOSIS — K219 Gastro-esophageal reflux disease without esophagitis: Secondary | ICD-10-CM | POA: Diagnosis not present

## 2024-05-23 DIAGNOSIS — E1159 Type 2 diabetes mellitus with other circulatory complications: Secondary | ICD-10-CM

## 2024-05-23 DIAGNOSIS — E782 Mixed hyperlipidemia: Secondary | ICD-10-CM | POA: Diagnosis not present

## 2024-05-23 DIAGNOSIS — R221 Localized swelling, mass and lump, neck: Secondary | ICD-10-CM | POA: Diagnosis not present

## 2024-05-23 NOTE — Assessment & Plan Note (Signed)
 Type 2 Diabetes Mellitus Long-standing Type 2 Diabetes Mellitus with improved glucose control. Gastrointestinal side effects from metformin and Mounjaro  noted. Lab Results  Component Value Date   HGBA1C 6.0 10/04/2023   - Continue metformin 500 mg twice daily. - Continue Mounjaro  at 2.5 mg. - Continue NovoLog at 10 units for lunch and 5 units before breakfast and dinner and Tresiba at 70 units daily.   Hypertension Variable blood pressure with recent low diastolic pressure. History of white coat syndrome. Occasional dizziness, especially in the morning. - Consider medication adjustment if hypotension persists.  BP Readings from Last 3 Encounters:  05/23/24 (!) 138/50  05/16/24 129/69  05/01/24 136/60   - Continue medication regimen

## 2024-05-23 NOTE — Assessment & Plan Note (Signed)
 Recent palpable neck mass with throat discomfort. Differential includes thyroid nodule changes or other neck masses. - Order neck ultrasound to evaluate the mass. - Refer to a specialist if ultrasound findings are concerning.

## 2024-05-23 NOTE — Progress Notes (Signed)
 Subjective:  Patient ID: Bethany Lynch, female    DOB: Sep 12, 1951  Age: 73 y.o. MRN: [id number]  Chief Complaint  Patient presents with   Cyst    Upper chest    Discussed the use of AI scribe software for clinical note transcription with the patient, who gave verbal consent to proceed.  History of Present Illness   Bethany Lynch is a 73 year old female with diabetes who presents with elevated blood sugar levels and throat discomfort.  Neck mass: She has a history of thyroid nodules, previously biopsied and found to be noncancerous. She experiences frequent throat discomfort and recently noticed a 'knot' in her throat area. A dentist observed a red area in her throat, which resolved upon re-evaluation. She also experiences dry mouth and epistaxis, which she attributes to her CPAP machine.  Diabetes: She has experienced elevated blood sugar levels recently, which she attributes to dietary indiscretions during a recent camp trip. She is currently on metformin 500 mg twice daily, reduced from 1000 mg twice daily due to gastrointestinal side effects, and Mounjaro  2.5 mg to avoid rapid weight loss. Her insulin regimen was adjusted to 5 units three times a day, down from 10 units, due to nocturnal hypoglycemic episodes. However, her blood sugar has been running high since the adjustment. Reports eating things that aren't on her diet. CGM states her blood sugar was 198.   GERD: She experiences occasional indigestion and queasy stomach, particularly after consuming certain foods. She takes esomeprazole for reflux and uses papaya enzymes and DGL licorice for stomach comfort.  Hypertension: Her diastolic blood pressure has been noted to be low, with a recent reading of 138/50. She experiences occasional dizziness, particularly in the mornings. Amlodipine  5 mg, lasix  40 mg, aldactone 25 mg and telmisartan 80 mg once daily and hydralazine 50 mg by mouth THREE TIMES A DAY. Denies chest pain or shortness of  breath.  Hyperlipidemia: She has a history of leg pain, which she suspects may be related to her cholesterol medication. She takes Crestor  10 mg by mouth once daily.         05/01/2024    3:08 PM 01/28/2021   10:04 AM  Depression screen PHQ 2/9  Decreased Interest 1 3  Down, Depressed, Hopeless 1 1  PHQ - 2 Score 2 4  Altered sleeping 1 3  Tired, decreased energy 1 3  Change in appetite 1 1  Feeling bad or failure about yourself  1 2  Trouble concentrating 1 2  Moving slowly or fidgety/restless 1 2  Suicidal thoughts 1 1  PHQ-9 Score 9 18  Difficult doing work/chores Somewhat difficult Very difficult        05/01/2024    3:08 PM  Fall Risk   Falls in the past year? 0  Number falls in past yr: 0  Injury with Fall? 0  Risk for fall due to : No Fall Risks  Follow up Falls evaluation completed    Patient Care Team: Teressa Harrie HERO, FNP as PCP - General (Family Medicine)   Review of Systems  Constitutional:  Negative for chills, fatigue and fever.  HENT:  Negative for congestion, ear pain and sore throat.   Respiratory:  Negative for cough and shortness of breath.   Cardiovascular:  Negative for chest pain and palpitations.  Gastrointestinal:  Negative for abdominal pain, constipation, diarrhea, nausea and vomiting.  Endocrine: Negative for polydipsia, polyphagia and polyuria.  Genitourinary:  Negative for difficulty urinating and dysuria.  Musculoskeletal:  Negative for arthralgias, back pain and myalgias.  Skin:  Negative for rash.       Knot on the left side on the upper chest.  Neurological:  Negative for headaches.  Psychiatric/Behavioral:  Negative for dysphoric mood. The patient is not nervous/anxious.     Current Outpatient Medications on File Prior to Visit  Medication Sig Dispense Refill   amLODipine  (NORVASC ) 5 MG tablet Take 1 tablet (5 mg total) by mouth daily. 90 tablet 2   aspirin 81 MG chewable tablet Chew 81 mg by mouth daily.     buPROPion  (WELLBUTRIN  SR)  150 MG 12 hr tablet Take 1 tablet (150 mg total) by mouth daily. 90 tablet 0   cetirizine (ZYRTEC) 10 MG tablet Take 10 mg by mouth daily.     Cholecalciferol 50 MCG (2000 UT) CAPS Take 1 capsule by mouth daily as needed (Patient determines when to take).     dicyclomine (BENTYL) 20 MG tablet Take 20 mg by mouth every 6 (six) hours. PRN     doxycycline (PERIOSTAT) 20 MG tablet Take 20 mg by mouth 2 (two) times daily.     esomeprazole (NEXIUM) 40 MG capsule Take 40 mg by mouth daily.     FARXIGA 10 MG TABS tablet Take 10 mg by mouth daily.  1   furosemide  (LASIX ) 40 MG tablet Take 1 tablet (40 mg total) by mouth daily. 90 tablet 0   gabapentin (NEURONTIN) 100 MG capsule Take 100 mg by mouth 3 (three) times daily as needed (nerve pain).     hydrALAZINE (APRESOLINE) 50 MG tablet Take 1 tablet by mouth 3 (three) times daily.  1   LORazepam  (ATIVAN ) 0.5 MG tablet Take 1 tablet (0.5 mg total) by mouth 2 (two) times daily as needed for anxiety. 30 tablet 2   metFORMIN (GLUCOPHAGE) 500 MG tablet Take 1,000 mg by mouth 2 (two) times daily.     Misc Natural Products (TART CHERRY ADVANCED PO) Take 1 tablet by mouth daily as needed (Patient determines when to take).     Multiple Vitamin (MULTIVITAMIN) tablet Take 1 tablet by mouth daily.     Multiple Vitamins-Minerals (VISION PLUS PO) Take 1 tablet by mouth daily.     NON FORMULARY Take 1 tablet by mouth See admin instructions. ORGANIC TUMERIC CURCUMIN  WITH GINGER AND BIOPERINE DIETARY SUPPLEMENT TAKE AS NEEDED     NOVOLOG FLEXPEN 100 UNIT/ML FlexPen Inject 5 Units into the muscle 3 (three) times daily with meals.  1   Omega-3 Fatty Acids (FISH OIL PO) Take 1,400 mg by mouth daily.     ondansetron  (ZOFRAN ) 4 MG tablet Take 1 tablet (4 mg total) by mouth every 8 (eight) hours as needed for nausea or vomiting. 20 tablet 1   OVER THE COUNTER MEDICATION Take 1 tablet by mouth as needed (loose stools).     rosuvastatin  (CRESTOR ) 10 MG tablet Take 1 tablet (10 mg  total) by mouth daily. 90 tablet 2   Simethicone (GAS-X PO) Take 1 tablet by mouth as needed (Gas).     spironolactone (ALDACTONE) 25 MG tablet Take 25 mg by mouth daily. with food     telmisartan (MICARDIS) 80 MG tablet Take 1 tablet by mouth daily.     tirzepatide  (MOUNJARO ) 2.5 MG/0.5ML Pen Inject 2.5 mg into the skin once a week. 2 mL 0   TRESIBA FLEXTOUCH 200 UNIT/ML SOPN Inject 70 Units into the skin at bedtime.  0   vitamin E 400 UNIT capsule  Take 400 Units by mouth daily.     zinc gluconate 50 MG tablet Take 50 mg by mouth daily.     No current facility-administered medications on file prior to visit.   Past Medical History:  Diagnosis Date   Anxiety    Aortic stenosis 10/19/2019   Arthritis    Back pain    CFS (chronic fatigue syndrome)    Constipation    Diabetes (HCC)    Edema of both lower extremities    Essential hypertension 09/28/2018   GERD (gastroesophageal reflux disease)    Glaucoma    Hyperlipidemia 09/28/2018   IBS (irritable bowel syndrome)    Joint pain    Low energy    Lymphedema of lower extremity 01/02/2019   Palpitations    Shortness of breath 09/28/2018   SOB (shortness of breath)    Stenosis of left carotid artery 09/28/2018   Swallowing difficulty    Past Surgical History:  Procedure Laterality Date   GALLBLADDER SURGERY  07/27/1988   HERNIA REPAIR  04/04/2012   HERNIA REPAIR  09/09/2015   HYESTERECTOMY  02/06/1991    Family History  Problem Relation Age of Onset   Memory loss Mother    Diabetes Mother    Colon cancer Mother    Diabetes type II Mother    High blood pressure Mother    High Cholesterol Mother    Obesity Mother    Stroke Father    Leukemia Father    High blood pressure Father    High Cholesterol Father    Heart disease Father    Sleep apnea Father    Obesity Father    Social History   Socioeconomic History   Marital status: Married    Spouse name: Butler   Number of children: Not on file   Years of education:  Not on file   Highest education level: Not on file  Occupational History   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  Tobacco Use   Smoking status: Never   Smokeless tobacco: Never  Vaping Use   Vaping status: Never Used  Substance and Sexual Activity   Alcohol use: Never   Drug use: Never   Sexual activity: Not Currently  Other Topics Concern   Not on file  Social History Narrative   Not on file   Social Drivers of Health   Financial Resource Strain: Not on file  Food Insecurity: No Food Insecurity (05/02/2024)   Hunger Vital Sign    Worried About Running Out of Food in the Last Year: Never true    Ran Out of Food in the Last Year: Never true  Transportation Needs: No Transportation Needs (05/02/2024)   PRAPARE - Administrator, Civil Service (Medical): No    Lack of Transportation (Non-Medical): No  Physical Activity: Not on file  Stress: Not on file  Social Connections: Not on file    Objective:  BP (!) 138/50 (BP Location: Right Arm, Patient Position: Sitting, Cuff Size: Large)   Pulse 71   Temp (!) 97.5 F (36.4 C)   Resp 16   Ht 5' 4 (1.626 m)   Wt 295 lb (133.8 kg)   SpO2 97%   BMI 50.64 kg/m      05/23/2024    3:42 PM 05/23/2024    3:06 PM 05/16/2024    2:00 PM  BP/Weight  Systolic BP 138 140 129  Diastolic BP 50 58 69  Wt. (Lbs)  295 290  BMI  50.64 kg/m2 49.78 kg/m2    Physical Exam Vitals reviewed.  Constitutional:      General: She is not in acute distress.    Appearance: Normal appearance. She is obese.   Eyes:     Conjunctiva/sclera: Conjunctivae normal.    Cardiovascular:     Rate and Rhythm: Normal rate and regular rhythm.     Heart sounds: Normal heart sounds. No murmur heard. Pulmonary:     Effort: Pulmonary effort is normal.     Breath sounds: Normal breath sounds. No wheezing.  Abdominal:     General: Bowel sounds are normal.   Musculoskeletal:        General: Normal range of motion.   Lymphadenopathy:     Cervical: No cervical adenopathy.   Skin:    General: Skin is warm.     Comments: Mass on left neck    Neurological:     Mental Status: She is alert. Mental status is at baseline.   Psychiatric:        Mood and Affect: Mood normal.        Behavior: Behavior normal.       Lab Results  Component Value Date   WBC 8.8 09/17/2022   HGB 14.3 09/17/2022   HCT 43.0 09/17/2022   PLT 324 09/17/2022   GLUCOSE 88 02/23/2022   CHOL 132 10/04/2023   TRIG 115 10/04/2023   HDL 48 10/04/2023   LDLCALC 86 02/23/2022   ALT 26 01/05/2024   AST 22 01/05/2024   NA 137 01/05/2024   K 4.8 01/05/2024   CL 102 01/05/2024   CREATININE 1.0 01/05/2024   BUN 37 (A) 01/05/2024   CO2 21 01/05/2024   TSH 3.28 01/05/2024   HGBA1C 6.0 10/04/2023     Assessment & Plan:  Mass of left side of neck Assessment & Plan: Recent palpable neck mass with throat discomfort. Differential includes thyroid nodule changes or other neck masses. - Order neck ultrasound to evaluate the mass. - Refer to a specialist if ultrasound findings are concerning.   Hypertension associated with type 2 diabetes mellitus (HCC) Assessment & Plan: Type 2 Diabetes Mellitus Long-standing Type 2 Diabetes Mellitus with improved glucose control. Gastrointestinal side effects from metformin and Mounjaro  noted. Lab Results  Component Value Date   HGBA1C 6.0 10/04/2023   - Continue metformin 500 mg twice daily. - Continue Mounjaro  at 2.5 mg. - Continue NovoLog at 10 units for lunch and 5 units before breakfast and dinner and Tresiba at 70 units daily.   Hypertension Variable blood pressure with recent low diastolic pressure. History of white coat syndrome. Occasional dizziness, especially in the morning. - Consider medication adjustment if hypotension persists.  BP Readings from Last 3 Encounters:  05/23/24 (!) 138/50  05/16/24 129/69  05/01/24 136/60   - Continue medication regimen   Mixed  hyperlipidemia Assessment & Plan: Leg pain possibly related to statin use. Discussion with cardiologist pending regarding potential adjustment to every other day dosing. - Follow up with cardiologist regarding cholesterol medication dosing and leg pain. - Continue Crestor  10 mg by mouth once daily   Gastroesophageal reflux disease, unspecified whether esophagitis present Assessment & Plan: Chronic GERD managed with esomeprazole. Recent symptoms possibly exacerbated by dietary choices. - Continue esomeprazole for GERD management. - Encourage dietary modifications to reduce symptoms, including smaller portions and increased protein intake. - Continue to take papaya enzyme in the morning and licorice supplements with meals       Follow-up: Return  in about 6 weeks (around 07/05/2024) for Keep scheduled appointment.   LILLETTE Kato I Leal-Borjas,acting as a scribe for Harrie CHRISTELLA Cedar, FNP.,have documented all relevant documentation on the behalf of Harrie CHRISTELLA Cedar, FNP,as directed by  Harrie CHRISTELLA Cedar, FNP while in the presence of Harrie CHRISTELLA Cedar, FNP.   An After Visit Summary was printed and given to the patient.  I attest that I have reviewed this visit and agree with the plan scribed by my staff.   Harrie CHRISTELLA Cedar, FNP [facility] [phone]

## 2024-05-23 NOTE — Assessment & Plan Note (Signed)
 Chronic GERD managed with esomeprazole. Recent symptoms possibly exacerbated by dietary choices. - Continue esomeprazole for GERD management. - Encourage dietary modifications to reduce symptoms, including smaller portions and increased protein intake. - Continue to take papaya enzyme in the morning and licorice supplements with meals

## 2024-05-23 NOTE — Assessment & Plan Note (Signed)
 Leg pain possibly related to statin use. Discussion with cardiologist pending regarding potential adjustment to every other day dosing. - Follow up with cardiologist regarding cholesterol medication dosing and leg pain. - Continue Crestor  10 mg by mouth once daily

## 2024-05-26 ENCOUNTER — Ambulatory Visit (HOSPITAL_BASED_OUTPATIENT_CLINIC_OR_DEPARTMENT_OTHER)
Admission: RE | Admit: 2024-05-26 | Discharge: 2024-05-26 | Disposition: A | Discharge status: Home / Self Care | Source: Ambulatory Visit | Attending: Family Medicine | Admitting: Family Medicine

## 2024-05-26 ENCOUNTER — Other Ambulatory Visit: Payer: Self-pay | Admitting: Family Medicine

## 2024-05-26 DIAGNOSIS — R221 Localized swelling, mass and lump, neck: Secondary | ICD-10-CM

## 2024-05-26 NOTE — Telephone Encounter (Unsigned)
 Copied from CRM [id number]. Topic: Clinical - Medication Refill >> May 26, 2024 11:00 AM Silvana J wrote: Medication: gabapentin (NEURONTIN) 100 MG capsule, hydrALAZINE (APRESOLINE) 50 MG tablet  Has the patient contacted their pharmacy? Yes (Agent: If no, request that the patient contact the pharmacy for the refill. If patient does not wish to contact the pharmacy document the reason why and proceed with request.) (Agent: If yes, when and what did the pharmacy advise?)  This is the patient's preferred pharmacy:  [id number] - [place], [place] - [address] AT [facility] [address] [place] [address] Phone: [phone] Fax: [phone]  Is this the correct pharmacy for this prescription? Yes If no, delete pharmacy and type the correct one.   Has the prescription been filled recently? Yes  Is the patient out of the medication? No  Has the patient been seen for an appointment in the last year OR does the patient have an upcoming appointment? Yes  Can we respond through MyChart? Yes  Agent: Please be advised that Rx refills may take up to 3 business days. We ask that you follow-up with your pharmacy.

## 2024-05-27 ENCOUNTER — Encounter: Payer: Self-pay | Admitting: Nurse Practitioner

## 2024-05-29 ENCOUNTER — Ambulatory Visit: Payer: Self-pay | Admitting: Family Medicine

## 2024-05-29 MED ORDER — GABAPENTIN 100 MG PO CAPS: 100.0000 mg | ORAL_CAPSULE | Freq: Three times a day (TID) | ORAL | 0 refills | Status: DC | PRN

## 2024-06-12 ENCOUNTER — Other Ambulatory Visit: Payer: Self-pay | Admitting: Family Medicine

## 2024-06-13 ENCOUNTER — Encounter: Payer: Self-pay | Admitting: Nurse Practitioner

## 2024-06-13 ENCOUNTER — Ambulatory Visit (INDEPENDENT_AMBULATORY_CARE_PROVIDER_SITE_OTHER): Admitting: Nurse Practitioner

## 2024-06-13 VITALS — BP 134/63 | HR 54 | Temp 98.0°F | Ht 64.0 in | Wt 286.0 lb

## 2024-06-13 DIAGNOSIS — Z6841 Body Mass Index (BMI) 40.0 and over, adult: Secondary | ICD-10-CM

## 2024-06-13 DIAGNOSIS — Z7984 Long term (current) use of oral hypoglycemic drugs: Secondary | ICD-10-CM

## 2024-06-13 DIAGNOSIS — Z794 Long term (current) use of insulin: Secondary | ICD-10-CM

## 2024-06-13 DIAGNOSIS — E66813 Obesity, class 3: Secondary | ICD-10-CM | POA: Diagnosis not present

## 2024-06-13 DIAGNOSIS — Z7985 Long-term (current) use of injectable non-insulin antidiabetic drugs: Secondary | ICD-10-CM

## 2024-06-13 DIAGNOSIS — E1169 Type 2 diabetes mellitus with other specified complication: Secondary | ICD-10-CM

## 2024-06-13 MED ORDER — TIRZEPATIDE 2.5 MG/0.5ML ~~LOC~~ SOAJ
2.5000 mg | SUBCUTANEOUS | 0 refills | Status: DC
Start: 2024-06-13 — End: 2024-07-13

## 2024-06-13 NOTE — Patient Instructions (Signed)

## 2024-06-13 NOTE — Progress Notes (Unsigned)
 Office: [phone]  /  Fax: [phone]  WEIGHT SUMMARY AND BIOMETRICS  Weight Lost Since Last Visit: 4lb  Weight Gained Since Last Visit: 0lb   Vitals Temp: 98 F (36.7 C) BP: 134/63 Pulse Rate: (!) 54 SpO2: 97 %   Anthropometric Measurements Height: 5' 4 (1.626 m) Weight: 286 lb (129.7 kg) BMI (Calculated): 49.07 Weight at Last Visit: 290lb Weight Lost Since Last Visit: 4lb Weight Gained Since Last Visit: 0lb Starting Weight: 339lb Total Weight Loss (lbs): 53 lb (24 kg)   Body Composition  Body Fat %: 59.4 % Fat Mass (lbs): 170 lbs Muscle Mass (lbs): 110.4 lbs Visceral Fat Rating : 24   Other Clinical Data Fasting: No Labs: No Today's Visit #: 26 Starting Date: 01/28/21     HPI  Chief Complaint: OBESITY  Bethany Lynch is here to discuss her progress with her obesity treatment plan. She is on the the Category 2 Plan and states she is following her eating plan approximately 50-60 % of the time. She states she is exercising 45 minutes 1 days per week.   Interval History:  Since last office visit she has lost 4 pounds. She is trying to eat more protein.  Not really following a meal plan.  She is eating out a lot-does share with her husband.  She is drinking water and diet sodas.  She is going to water aerobics once per week.    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  Denies side effects.     Previous pharmacotherapy for medical weight loss:  None   Bariatric surgery:  Patient has not had bariatric   Pharmacotherapy for DMT2:   She is taking Mounjaro  2.5mg , Farxiga, Metformin 1000 mg BID (took 500mg  BID for one week and then increased back to 1000mg  BID due to high blood sugars), Tresiba 70 units pm and Novolog 5 units BF, 10 units lunch and 5 units dinner.  Denies side effects.  Saw cardiology last on 2/7 and PCP last on 05/01/24      Last A1c was 6 on 04/05/24 CBGs:  average 146 for 30 days and  126 over 7 days Episodes of hypoglycemia: None On ASA 81mg  and statin.  Last eye exam:  Dec 16th, 2024 She has tried Victoza in the past and stopped due to side effects.   - PCP last on 05/01/24 and has been referred to diabetic educator.  Currently doesn't have an appt.     She recently had an US  soft tissue head and neck: IMPRESSION: The palpable abnormality appears to correspond with a small 0.6 cm lymph node within the subcutaneous fat of the left supraclavicular space. The node is not overtly enlarged. Differential considerations include reactive adenopathy, or less likely early nodal metastatic disease. Recommend continued close clinical surveillance. If there is evidence of enlargement over time, further imaging and potentially soft tissue sampling may become warranted. Copied from results on 05/26/24  =Plans to call PCP to sched appt for labs and would like to see ENT for biopsy.       Lab Results  Component Value Date   HGBA1C 6.0 10/04/2023   HGBA1C 6.2 (H) 02/23/2022   HGBA1C 6.3 (H) 10/09/2021   Lab Results  Component Value Date   LDLCALC 86 02/23/2022   CREATININE 1.0 01/05/2024       PHYSICAL EXAM:  Blood pressure 134/63, pulse (!) 54, temperature 98 F (36.7 C), height 5' 4 (1.626 m), weight  286 lb (129.7 kg), SpO2 97%. Body mass index is 49.09 kg/m.  General: She is overweight, cooperative, alert, well developed, and in no acute distress. PSYCH: Has normal mood, affect and thought process.   Extremities: No edema.  Neurologic: No gross sensory or motor deficits. No tremors or fasciculations noted.    DIAGNOSTIC DATA REVIEWED:  BMET    Component Value Date/Time   NA 137 01/05/2024 0000   K 4.8 01/05/2024 0000   CL 102 01/05/2024 0000   CO2 21 01/05/2024 0000   GLUCOSE 88 02/23/2022 0953   BUN 37 (A) 01/05/2024 0000   CREATININE 1.0 01/05/2024 0000   CREATININE 1.00 02/23/2022 0953   CALCIUM  10.1 01/05/2024 0000   GFRNONAA 44 (L) 01/21/2021  1324   GFRAA 51 (L) 01/21/2021 1324   Lab Results  Component Value Date   HGBA1C 6.0 10/04/2023   HGBA1C 6.3 (H) 01/28/2021   Lab Results  Component Value Date   INSULIN 3.5 02/23/2022   INSULIN 3.7 01/28/2021   Lab Results  Component Value Date   TSH 3.28 01/05/2024   CBC    Component Value Date/Time   WBC 8.8 09/17/2022 1515   RBC 4.93 09/17/2022 1515   HGB 14.3 09/17/2022 1515   HCT 43.0 09/17/2022 1515   PLT 324 09/17/2022 1515   MCV 87 09/17/2022 1515   MCH 29.0 09/17/2022 1515   MCHC 33.3 09/17/2022 1515   RDW 12.9 09/17/2022 1515   Iron Studies    Component Value Date/Time   FERRITIN 27 09/17/2022 1515   Lipid Panel     Component Value Date/Time   CHOL 132 10/04/2023 0835   CHOL 155 02/23/2022 0953   TRIG 115 10/04/2023 0835   HDL 48 10/04/2023 0835   HDL 47 02/23/2022 0953   LDLCALC 86 02/23/2022 0953   Hepatic Function Panel     Component Value Date/Time   PROT 6.7 02/23/2022 0953   ALBUMIN 4.2 01/05/2024 0000   ALBUMIN 4.2 02/23/2022 0953   AST 22 01/05/2024 0000   ALT 26 01/05/2024 0000   ALKPHOS 70 01/05/2024 0000   BILITOT 0.3 02/23/2022 0953      Component Value Date/Time   TSH 3.28 01/05/2024 0000   TSH 3.060 09/17/2022 1515   Nutritional Lab Results  Component Value Date   VD25OH 46.3 09/17/2022   VD25OH 51.6 02/23/2022   VD25OH 60.3 10/09/2021     ASSESSMENT AND PLAN  TREATMENT PLAN FOR OBESITY:  Recommended Dietary Goals  Bethany Lynch is currently in the action stage of change. As such, her goal is to continue weight management plan. She has agreed to the Category 2 Plan.  Behavioral Intervention  We discussed the following Behavioral Modification Strategies today: increasing lean protein intake to established goals, decreasing simple carbohydrates , increasing vegetables, increasing fiber rich foods, increasing water intake , and continue to work on maintaining a reduced calorie state, getting the recommended amount of  protein, incorporating whole foods, making healthy choices, staying well hydrated and practicing mindfulness when eating..  Additional resources provided today: protein contents of food, 100 & 200 calories protein snack ideas  Recommended Physical Activity Goals  Bethany Lynch has been advised to work up to 150 minutes of moderate intensity aerobic activity a week and strengthening exercises 2-3 times per week for cardiovascular health, weight loss maintenance and preservation of muscle mass.   She has agreed to Think about enjoyable ways to increase daily physical activity and overcoming barriers to exercise, Increase physical activity in their  day and reduce sedentary time (increase NEAT)., and continue to gradually increase the amount and intensity of exercise routine   ASSOCIATED CONDITIONS ADDRESSED TODAY  Action/Plan  Type 2 diabetes mellitus with other specified complication, with long-term current use of insulin (HCC) -     continue Tirzepatide ; Inject 2.5 mg into the skin once a week.  Dispense: 2 mL; Refill: 0. 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  Other possible side effects are headaches, belching, diarrhea, tiredness (fatigue), vomiting, upset stomach, dizziness, heartburn and stomach (abdominal pain).  If you think that you are becoming dehydrated, please inform our office or your primary family provider.  Stop immediately and go to ER if you have any symptoms of a serious allergic reaction including swelling of your face,  lips, tongue or throat; problems breathing or swallowing; severe rash or itching; fainting or feeling dizzy; or very rapid heart rate.                            Class 3 severe obesity due to excess calories with body mass index (BMI) of 45.0 to 49.9 in adult      Plans to call PCP to sched appt for labs and would like to see ENT for biopsy.  Will discuss with PCP  To sched appt with diabetic educator     Return in about 4 weeks (around 07/11/2024).Bethany Lynch She was informed of the importance of frequent follow up visits to maximize her success with intensive lifestyle modifications for her multiple health conditions.   ATTESTASTION STATEMENTS:  Reviewed by clinician on day of visit: allergies, medications, problem list, medical history, surgical history, family history, social history, and previous encounter notes.     Corean SAUNDERS. Bethany Henkin FNP-C

## 2024-06-25 ENCOUNTER — Other Ambulatory Visit: Payer: Self-pay | Admitting: Family Medicine

## 2024-07-05 ENCOUNTER — Encounter: Payer: Self-pay | Admitting: Family Medicine

## 2024-07-05 ENCOUNTER — Ambulatory Visit (INDEPENDENT_AMBULATORY_CARE_PROVIDER_SITE_OTHER): Admitting: Family Medicine

## 2024-07-05 VITALS — BP 124/68 | HR 53 | Temp 98.2°F | Resp 18 | Ht 64.0 in | Wt 300.0 lb

## 2024-07-05 DIAGNOSIS — E1159 Type 2 diabetes mellitus with other circulatory complications: Secondary | ICD-10-CM

## 2024-07-05 DIAGNOSIS — R221 Localized swelling, mass and lump, neck: Secondary | ICD-10-CM

## 2024-07-05 DIAGNOSIS — E782 Mixed hyperlipidemia: Secondary | ICD-10-CM | POA: Diagnosis not present

## 2024-07-05 DIAGNOSIS — K580 Irritable bowel syndrome with diarrhea: Secondary | ICD-10-CM

## 2024-07-05 DIAGNOSIS — I152 Hypertension secondary to endocrine disorders: Secondary | ICD-10-CM

## 2024-07-05 MED ORDER — TELMISARTAN 80 MG PO TABS: 80.0000 mg | ORAL_TABLET | Freq: Every day | ORAL | 1 refills | Status: DC

## 2024-07-05 MED ORDER — FREESTYLE LIBRE 3 PLUS SENSOR MISC: 3 refills | Status: DC

## 2024-07-05 MED ORDER — DICYCLOMINE HCL 20 MG PO TABS: 20.0000 mg | ORAL_TABLET | Freq: Four times a day (QID) | ORAL | 0 refills | Status: DC

## 2024-07-05 MED ORDER — SPIRONOLACTONE 25 MG PO TABS: 25.0000 mg | ORAL_TABLET | Freq: Every day | ORAL | 1 refills | Status: DC

## 2024-07-05 NOTE — Progress Notes (Unsigned)
 Subjective:  Patient ID: Bethany Lynch, female    DOB: Jul 17, 1951  Age: 73 y.o. MRN: [id number]  Chief Complaint  Patient presents with  . Medical Management of Chronic Issues  . Cyst    Diabetes   HPI     05/01/2024    3:08 PM 01/28/2021   10:04 AM  Depression screen PHQ 2/9  Decreased Interest 1 3  Down, Depressed, Hopeless 1 1  PHQ - 2 Score 2 4  Altered sleeping 1 3  Tired, decreased energy 1 3  Change in appetite 1 1  Feeling bad or failure about yourself  1 2  Trouble concentrating 1 2  Moving slowly or fidgety/restless 1 2  Suicidal thoughts 1 1  PHQ-9 Score 9 18  Difficult doing work/chores Somewhat difficult Very difficult        05/01/2024    3:08 PM  Fall Risk   Falls in the past year? 0  Number falls in past yr: 0  Injury with Fall? 0  Risk for fall due to : No Fall Risks  Follow up Falls evaluation completed    Patient Care Team: Teressa Harrie HERO, FNP as PCP - General (Family Medicine)   Review of Systems  Constitutional:  Negative for chills, diaphoresis, fatigue and fever.  HENT:  Negative for congestion, ear pain and sinus pain.   Eyes: Negative.   Respiratory:  Negative for cough and shortness of breath.   Cardiovascular:  Negative for chest pain and palpitations.  Gastrointestinal:  Positive for diarrhea. Negative for abdominal pain, constipation, nausea and vomiting.  Endocrine: Negative.   Genitourinary:  Negative for dysuria, frequency and urgency.  Musculoskeletal:  Negative for arthralgias.  Allergic/Immunologic: Negative.   Neurological:  Negative for dizziness, weakness, light-headedness and headaches.  Hematological: Negative.   Psychiatric/Behavioral:  Negative for dysphoric mood. The patient is not nervous/anxious.     Current Outpatient Medications on File Prior to Visit  Medication Sig Dispense Refill  . amLODipine  (NORVASC ) 5 MG tablet Take 1 tablet (5 mg total) by mouth daily. 90 tablet 2  . aspirin 81 MG chewable tablet Chew 81  mg by mouth daily.    . buPROPion  (WELLBUTRIN  SR) 150 MG 12 hr tablet Take 1 tablet (150 mg total) by mouth daily. 90 tablet 0  . Calcium  Carb-Cholecalciferol (CALCIUM  500/VITAMIN D ) 500-3.125 MG-MCG TABS Take 1 each by mouth daily.    . cetirizine (ZYRTEC) 10 MG tablet Take 10 mg by mouth daily.    . Cholecalciferol 50 MCG (2000 UT) CAPS Take 1 capsule by mouth daily as needed (Patient determines when to take).    . dicyclomine  (BENTYL ) 20 MG tablet Take 20 mg by mouth every 6 (six) hours. PRN    . doxycycline (PERIOSTAT) 20 MG tablet Take 20 mg by mouth 2 (two) times daily.    SABRA esomeprazole (NEXIUM) 40 MG capsule Take 40 mg by mouth daily.    SABRA FARXIGA 10 MG TABS tablet Take 10 mg by mouth daily.  1  . furosemide  (LASIX ) 40 MG tablet Take 1 tablet (40 mg total) by mouth daily. 90 tablet 0  . gabapentin  (NEURONTIN ) 100 MG capsule TAKE 1 CAPSULE (100 MG TOTAL) BY MOUTH 3 (THREE) TIMES DAILY AS NEEDED (NERVE PAIN). 90 capsule 0  . hydrALAZINE (APRESOLINE) 50 MG tablet Take 1 tablet by mouth 3 (three) times daily.  1  . LORazepam  (ATIVAN ) 0.5 MG tablet Take 1 tablet (0.5 mg total) by mouth 2 (two) times daily as  needed for anxiety. 30 tablet 2  . metFORMIN (GLUCOPHAGE) 500 MG tablet Take 1,000 mg by mouth 2 (two) times daily.    . Misc Natural Products (TART CHERRY ADVANCED PO) Take 1 tablet by mouth daily as needed (Patient determines when to take).    . Multiple Vitamin (MULTIVITAMIN) tablet Take 1 tablet by mouth daily.    . Multiple Vitamins-Minerals (VISION PLUS PO) Take 1 tablet by mouth daily.    . NON FORMULARY Take 1 tablet by mouth See admin instructions. ORGANIC TUMERIC CURCUMIN  WITH GINGER AND BIOPERINE DIETARY SUPPLEMENT TAKE AS NEEDED    . NOVOLOG FLEXPEN 100 UNIT/ML FlexPen Inject 5 Units into the muscle 3 (three) times daily with meals.  1  . Omega-3 Fatty Acids (FISH OIL PO) Take 1,400 mg by mouth daily.    . ondansetron  (ZOFRAN ) 4 MG tablet Take 1 tablet (4 mg total) by mouth  every 8 (eight) hours as needed for nausea or vomiting. 20 tablet 1  . OVER THE COUNTER MEDICATION Take 1 tablet by mouth as needed (loose stools).    . rosuvastatin  (CRESTOR ) 10 MG tablet Take 1 tablet (10 mg total) by mouth daily. (Patient taking differently: Take 10 mg by mouth 3 (three) times a week.) 90 tablet 2  . Simethicone (GAS-X PO) Take 1 tablet by mouth as needed (Gas).    . spironolactone  (ALDACTONE ) 25 MG tablet Take 25 mg by mouth daily. with food    . telmisartan  (MICARDIS ) 80 MG tablet Take 1 tablet by mouth daily.    . tirzepatide  (MOUNJARO ) 2.5 MG/0.5ML Pen Inject 2.5 mg into the skin once a week. 2 mL 0  . TRESIBA FLEXTOUCH 200 UNIT/ML SOPN Inject 70 Units into the skin at bedtime.  0  . vitamin E 400 UNIT capsule Take 400 Units by mouth daily.    SABRA zinc gluconate 50 MG tablet Take 50 mg by mouth daily.     No current facility-administered medications on file prior to visit.   Past Medical History:  Diagnosis Date  . Anxiety   . Aortic stenosis 10/19/2019  . Arthritis   . Back pain   . CFS (chronic fatigue syndrome)   . Constipation   . Diabetes (HCC)   . Edema of both lower extremities   . Essential hypertension 09/28/2018  . GERD (gastroesophageal reflux disease)   . Glaucoma   . Hyperlipidemia 09/28/2018  . IBS (irritable bowel syndrome)   . Joint pain   . Low energy   . Lymphedema of lower extremity 01/02/2019  . Palpitations   . Shortness of breath 09/28/2018  . SOB (shortness of breath)   . Stenosis of left carotid artery 09/28/2018  . Swallowing difficulty    Past Surgical History:  Procedure Laterality Date  . GALLBLADDER SURGERY  07/27/1988  . HERNIA REPAIR  04/04/2012  . HERNIA REPAIR  09/09/2015  . HYESTERECTOMY  02/06/1991    Family History  Problem Relation Age of Onset  . Memory loss Mother   . Diabetes Mother   . Colon cancer Mother   . Diabetes type II Mother   . High blood pressure Mother   . High Cholesterol Mother   . Obesity  Mother   . Stroke Father   . Leukemia Father   . High blood pressure Father   . High Cholesterol Father   . Heart disease Father   . Sleep apnea Father   . Obesity Father    Social History   Socioeconomic  History  . Marital status: Married    Spouse name: Butler  . Number of children: Not on file  . Years of education: Not on file  . Highest education level: Not on file  Occupational History  . 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 Smoking status: Never  . Smokeless tobacco: Never  Vaping Use  . Vaping status: Never Used  Substance and Sexual Activity  . Alcohol use: Never  . Drug use: Never  . Sexual activity: Not Currently  Other Topics Concern  . Not on file  Social History Narrative  . Not on file   Social Drivers of Health   Financial Resource Strain: Not on file  Food Insecurity: No Food Insecurity (05/02/2024)   Hunger Vital Sign   . Worried About Programme researcher, broadcasting/film/video in the Last Year: Never true   . Ran Out of Food in the Last Year: Never true  Transportation Needs: No Transportation Needs (05/02/2024)   PRAPARE - Transportation   . Lack of Transportation (Medical): No   . Lack of Transportation (Non-Medical): No  Physical Activity: Not on file  Stress: Not on file  Social Connections: Not on file    Objective:  BP 124/68   Pulse (!) 53   Temp 98.2 F (36.8 C) (Temporal)   Resp 18   Ht 5' 4 (1.626 m)   Wt 300 lb (136.1 kg)   SpO2 97%   BMI 51.49 kg/m      07/05/2024    7:53 AM 06/13/2024    3:00 PM 05/23/2024    3:42 PM  BP/Weight  Systolic BP 124 134 138  Diastolic BP 68 63 50  Wt. (Lbs) 300 286   BMI 51.49 kg/m2 49.09 kg/m2     Physical Exam Constitutional:      General: She is not in acute distress.    Appearance: Normal appearance. She is obese. She is not ill-appearing.  Eyes:     Conjunctiva/sclera: Conjunctivae normal.  Cardiovascular:     Rate and Rhythm: Normal rate and regular rhythm.      Heart sounds: Murmur heard.  Pulmonary:     Effort: Pulmonary effort is normal.     Breath sounds: Normal breath sounds. No wheezing.  Abdominal:     General: Bowel sounds are normal.     Palpations: Abdomen is soft.     Tenderness: There is abdominal tenderness (RLQ).     Hernia: A hernia is present.  Musculoskeletal:        General: Normal range of motion.  Neurological:     Mental Status: She is alert. Mental status is at baseline.  Psychiatric:        Mood and Affect: Mood normal.        Behavior: Behavior normal.     {Perform Simple Foot Exam  Perform Detailed exam:1} {Insert foot Exam (Optional):30965}   Lab Results  Component Value Date   WBC 8.8 09/17/2022   HGB 14.3 09/17/2022   HCT 43.0 09/17/2022   PLT 324 09/17/2022   GLUCOSE 88 02/23/2022   CHOL 132 10/04/2023   TRIG 115 10/04/2023   HDL 48 10/04/2023   LDLCALC 86 02/23/2022   ALT 26 01/05/2024   AST 22 01/05/2024   NA 137 01/05/2024   K 4.8 01/05/2024   CL 102 01/05/2024   CREATININE 1.0 01/05/2024   BUN 37 (A) 01/05/2024   CO2 21 01/05/2024   TSH 3.28 01/05/2024  HGBA1C 6.0 10/04/2023      Assessment & Plan:  There are no diagnoses linked to this encounter.   No orders of the defined types were placed in this encounter.   No orders of the defined types were placed in this encounter.    Follow-up: No follow-ups on file.   I,Angela Taylor,acting as a Neurosurgeon for Harrie CHRISTELLA Cedar, FNP.,have documented all relevant documentation on the behalf of Harrie CHRISTELLA Cedar, FNP,as directed by  Harrie CHRISTELLA Cedar, FNP while in the presence of Harrie CHRISTELLA Cedar, FNP.   An After Visit Summary was printed and given to the patient.  Harrie CHRISTELLA Cedar, FNP [facility] [phone]

## 2024-07-06 ENCOUNTER — Ambulatory Visit: Payer: Self-pay | Admitting: Family Medicine

## 2024-07-06 LAB — COMPREHENSIVE METABOLIC PANEL WITH GFR
ALT: 26 IU/L (ref 0–32)
AST: 19 IU/L (ref 0–40)
Albumin: 4.5 g/dL (ref 3.8–4.8)
Alkaline Phosphatase: 75 IU/L (ref 44–121)
BUN/Creatinine Ratio: 40 — ABNORMAL HIGH (ref 12–28)
BUN: 44 mg/dL — ABNORMAL HIGH (ref 8–27)
Bilirubin Total: 0.2 mg/dL (ref 0.0–1.2)
CO2: 21 mmol/L (ref 20–29)
Calcium: 10.6 mg/dL — ABNORMAL HIGH (ref 8.7–10.3)
Chloride: 102 mmol/L (ref 96–106)
Creatinine, Ser: 1.1 mg/dL — ABNORMAL HIGH (ref 0.57–1.00)
Globulin, Total: 2.4 g/dL (ref 1.5–4.5)
Glucose: 145 mg/dL — ABNORMAL HIGH (ref 70–99)
Potassium: 5 mmol/L (ref 3.5–5.2)
Sodium: 140 mmol/L (ref 134–144)
Total Protein: 6.9 g/dL (ref 6.0–8.5)
eGFR: 54 mL/min/1.73 — ABNORMAL LOW (ref >59)

## 2024-07-06 LAB — CBC WITH DIFFERENTIAL/PLATELET
Basophils Absolute: 0 x10E3/uL (ref 0.0–0.2)
Basos: 1 %
EOS (ABSOLUTE): 0.2 x10E3/uL (ref 0.0–0.4)
Eos: 4 %
Hematocrit: 40.8 % (ref 34.0–46.6)
Hemoglobin: 13.3 g/dL (ref 11.1–15.9)
Immature Grans (Abs): 0 x10E3/uL (ref 0.0–0.1)
Immature Granulocytes: 0 %
Lymphocytes Absolute: 1.9 x10E3/uL (ref 0.7–3.1)
Lymphs: 31 %
MCH: 28.9 pg (ref 26.6–33.0)
MCHC: 32.6 g/dL (ref 31.5–35.7)
MCV: 89 fL (ref 79–97)
Monocytes Absolute: 0.7 x10E3/uL (ref 0.1–0.9)
Monocytes: 11 %
Neutrophils Absolute: 3.4 x10E3/uL (ref 1.4–7.0)
Neutrophils: 53 %
Platelets: 325 x10E3/uL (ref 150–450)
RBC: 4.6 x10E6/uL (ref 3.77–5.28)
RDW: 13.1 % (ref 11.7–15.4)
WBC: 6.2 x10E3/uL (ref 3.4–10.8)

## 2024-07-06 LAB — LIPID PANEL
Chol/HDL Ratio: 3.3 ratio (ref 0.0–4.4)
Cholesterol, Total: 171 mg/dL (ref 100–199)
HDL: 52 mg/dL (ref >39)
LDL Chol Calc (NIH): 92 mg/dL (ref 0–99)
Triglycerides: 154 mg/dL — ABNORMAL HIGH (ref 0–149)
VLDL Cholesterol Cal: 27 mg/dL (ref 5–40)

## 2024-07-06 LAB — TSH+T4F+T3FREE+THYABS+TPO+VD25
Free T4: 1.15 ng/dL (ref 0.82–1.77)
T3, Free: 2.7 pg/mL (ref 2.0–4.4)
TSH: 3.8 u[IU]/mL (ref 0.450–4.500)
Thyroglobulin Antibody: <1 [IU]/mL (ref 0.0–0.9)
Thyroperoxidase Ab SerPl-aCnc: 10 [IU]/mL (ref 0–34)
Vit D, 25-Hydroxy: 53.9 ng/mL (ref 30.0–100.0)

## 2024-07-06 LAB — HEMOGLOBIN A1C
Est. average glucose Bld gHb Est-mCnc: 137 mg/dL
Hgb A1c MFr Bld: 6.4 % — ABNORMAL HIGH (ref 4.8–5.6)

## 2024-07-06 NOTE — Assessment & Plan Note (Signed)
 Recent palpable neck mass with throat discomfort. Differential includes thyroid nodule changes or other neck masses. - Order neck ultrasound to evaluate the mass. - Refer to a specialist if ultrasound findings are concerning.

## 2024-07-06 NOTE — Assessment & Plan Note (Signed)
 Stable Well controlled.  No changes to medicines. dicyclomine  (BENTYL ) 20 MG tablet - every 6 hours as needed Labs drawn today.

## 2024-07-06 NOTE — Assessment & Plan Note (Signed)
 Leg pain possibly related to statin use. Discussion with cardiologist pending regarding potential adjustment to every other day dosing. - Follow up with cardiologist regarding cholesterol medication dosing and leg pain. - Continue Crestor  10 mg by mouth once daily

## 2024-07-06 NOTE — Assessment & Plan Note (Signed)
 Type 2 Diabetes Mellitus Long-standing Type 2 Diabetes Mellitus with improved glucose control. Gastrointestinal side effects from metformin and Mounjaro  noted. Lab Results  Component Value Date   HGBA1C 6.4 (H) 07/05/2024   - Continue metformin 500 mg twice daily. - Continue Mounjaro  at 2.5 mg. - Continue NovoLog at 10 units for lunch and 5 units before breakfast and dinner and Tresiba at 70 units daily.   Hypertension Variable blood pressure with recent low diastolic pressure. History of white coat syndrome. Occasional dizziness, especially in the morning. - Consider medication adjustment if hypotension persists.  BP Readings from Last 3 Encounters:  07/05/24 124/68  06/13/24 134/63  05/23/24 (!) 138/50   - Continue medication regimen - labs drawn today FU in 3 months

## 2024-07-13 ENCOUNTER — Encounter: Payer: Self-pay | Admitting: Nurse Practitioner

## 2024-07-13 ENCOUNTER — Ambulatory Visit (INDEPENDENT_AMBULATORY_CARE_PROVIDER_SITE_OTHER): Admitting: Nurse Practitioner

## 2024-07-13 VITALS — BP 138/72 | HR 80 | Temp 97.6°F | Ht 64.0 in | Wt 294.0 lb

## 2024-07-13 DIAGNOSIS — E1169 Type 2 diabetes mellitus with other specified complication: Secondary | ICD-10-CM | POA: Diagnosis not present

## 2024-07-13 DIAGNOSIS — Z6841 Body Mass Index (BMI) 40.0 and over, adult: Secondary | ICD-10-CM | POA: Diagnosis not present

## 2024-07-13 DIAGNOSIS — Z794 Long term (current) use of insulin: Secondary | ICD-10-CM | POA: Diagnosis not present

## 2024-07-13 DIAGNOSIS — Z7985 Long-term (current) use of injectable non-insulin antidiabetic drugs: Secondary | ICD-10-CM

## 2024-07-13 DIAGNOSIS — F5089 Other specified eating disorder: Secondary | ICD-10-CM | POA: Diagnosis not present

## 2024-07-13 DIAGNOSIS — E66813 Obesity, class 3: Secondary | ICD-10-CM

## 2024-07-13 MED ORDER — TIRZEPATIDE 2.5 MG/0.5ML ~~LOC~~ SOAJ
2.5000 mg | SUBCUTANEOUS | 0 refills | Status: DC
Start: 2024-07-13 — End: 2024-08-09

## 2024-07-13 MED ORDER — BUPROPION HCL ER (SR) 150 MG PO TB12: 150.0000 mg | ORAL_TABLET | Freq: Every day | ORAL | 0 refills | Status: DC

## 2024-07-13 NOTE — Progress Notes (Signed)
 Office: [phone]  /  Fax: [phone]  WEIGHT SUMMARY AND BIOMETRICS  Weight Lost Since Last Visit: 0lb  Weight Gained Since Last Visit: 8lb   Vitals Temp: 97.6 F (36.4 C) BP: 138/72 Pulse Rate: 80 SpO2: 98 %   Anthropometric Measurements Height: 5' 4 (1.626 m) Weight: 294 lb (133.4 kg) BMI (Calculated): 50.44 Weight at Last Visit: 286lb Weight Lost Since Last Visit: 0lb Weight Gained Since Last Visit: 8lb Starting Weight: 339lb Total Weight Loss (lbs): 45 lb (20.4 kg)   Body Composition  Body Fat %: 57.9 % Fat Mass (lbs): 170.2 lbs Muscle Mass (lbs): 117.6 lbs Visceral Fat Rating : 24   Other Clinical Data Fasting: No Labs: No Today's Visit #: 96 Starting Date: 01/28/21     HPI  Chief Complaint: OBESITY  Bethany Lynch is here to discuss her progress with her obesity treatment plan. She is on the the Category 2 Plan and states she is following her eating plan approximately 50 % of the time. She states she is exercising 45 minutes 1 days per week.   Interval History:  Since last office visit she has gained 8 lbs.  She doesn't like tracking. Her husband does all the cooking and doesn't cook healthy foods.  She also eats out a lot.  She is drinking water and 4-5 diet sodas daily.    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  Denies side effects.     Previous pharmacotherapy for medical weight loss:  None   Bariatric surgery:  Patient has not had bariatric   Pharmacotherapy for DMT2:   She is taking Mounjaro  2.5mg , Farxiga, Metformin 1000 mg BID, Tresiba 70 units pm and Novolog 8 units BF, 8 units lunch and 8 units dinner.  Denies side effects.  Saw cardiology last on 2/7 and PCP last on 07/05/24     Last A1c was 6.4 on 07/05/24 CBGs:  average 146 for 30 days and 157 over 7 days Episodes of hypoglycemia: two over the past 30 days-patient wasn't aware of either lows On ASA 81mg  and statin.  Last  eye exam:  Dec 16th, 2024-sched 11/15/24 She has tried Victoza in the past and stopped due to side effects.   Lab Results  Component Value Date   HGBA1C 6.4 (H) 07/05/2024   HGBA1C 6.0 10/04/2023   HGBA1C 6.2 (H) 02/23/2022   Lab Results  Component Value Date   LDLCALC 92 07/05/2024   CREATININE 1.10 (H) 07/05/2024     PHYSICAL EXAM:  Blood pressure 138/72, pulse 80, temperature 97.6 F (36.4 C), height 5' 4 (1.626 m), weight 294 lb (133.4 kg), SpO2 98%. Body mass index is 50.46 kg/m.  General: She is overweight, cooperative, alert, well developed, and in no acute distress. PSYCH: Has normal mood, affect and thought process.   Extremities: No edema.  Neurologic: No gross sensory or motor deficits. No tremors or fasciculations noted.    DIAGNOSTIC DATA REVIEWED:  BMET    Component Value Date/Time   NA 140 07/05/2024 0846   K 5.0 07/05/2024 0846   CL 102 07/05/2024 0846   CO2 21 07/05/2024 0846   GLUCOSE 145 (H) 07/05/2024 0846   BUN 44 (H) 07/05/2024 0846   CREATININE 1.10 (H) 07/05/2024 0846   CALCIUM  10.6 (H) 07/05/2024 0846   GFRNONAA 44 (L) 01/21/2021 1324   GFRAA 51 (L) 01/21/2021 1324   Lab Results  Component Value Date   HGBA1C 6.4 (H) 07/05/2024  HGBA1C 6.3 (H) 01/28/2021   Lab Results  Component Value Date   INSULIN 3.5 02/23/2022   INSULIN 3.7 01/28/2021   Lab Results  Component Value Date   TSH 3.800 07/05/2024   CBC    Component Value Date/Time   WBC 6.2 07/05/2024 0846   RBC 4.60 07/05/2024 0846   HGB 13.3 07/05/2024 0846   HCT 40.8 07/05/2024 0846   PLT 325 07/05/2024 0846   MCV 89 07/05/2024 0846   MCH 28.9 07/05/2024 0846   MCHC 32.6 07/05/2024 0846   RDW 13.1 07/05/2024 0846   Iron Studies    Component Value Date/Time   FERRITIN 27 09/17/2022 1515   Lipid Panel     Component Value Date/Time   CHOL 171 07/05/2024 0846   TRIG 154 (H) 07/05/2024 0846   HDL 52 07/05/2024 0846   CHOLHDL 3.3 07/05/2024 0846   LDLCALC 92  07/05/2024 0846   Hepatic Function Panel     Component Value Date/Time   PROT 6.9 07/05/2024 0846   ALBUMIN 4.5 07/05/2024 0846   AST 19 07/05/2024 0846   ALT 26 07/05/2024 0846   ALKPHOS 75 07/05/2024 0846   BILITOT 0.2 07/05/2024 0846      Component Value Date/Time   TSH 3.800 07/05/2024 0846   Nutritional Lab Results  Component Value Date   VD25OH 53.9 07/05/2024   VD25OH 46.3 09/17/2022   VD25OH 51.6 02/23/2022     ASSESSMENT AND PLAN  TREATMENT PLAN FOR OBESITY:  Recommended Dietary Goals  Fatima is currently in the action stage of change. As such, her goal is to continue weight management plan. She has agreed to start tracking using picture tracking on mynetdiary.  Will review macros at next visit.  Behavioral Intervention  We discussed the following Behavioral Modification Strategies today: increasing lean protein intake to established goals, decreasing simple carbohydrates , increasing vegetables, increasing fiber rich foods, increasing water intake , work on meal planning and preparation, work on tracking and journaling calories using tracking application, reading food labels , keeping healthy foods at home, identifying sources and decreasing liquid calories, continue to practice mindfulness when eating, planning for success, and continue to work on maintaining a reduced calorie state, getting the recommended amount of protein, incorporating whole foods, making healthy choices, staying well hydrated and practicing mindfulness when eating..  Additional resources provided today: NA  Recommended Physical Activity Goals  Atlee has been advised to work up to 150 minutes of moderate intensity aerobic activity a week and strengthening exercises 2-3 times per week for cardiovascular health, weight loss maintenance and preservation of muscle mass.   She has agreed to Think about enjoyable ways to increase daily physical activity and overcoming barriers to exercise and  Increase physical activity in their day and reduce sedentary time (increase NEAT).   ASSOCIATED CONDITIONS ADDRESSED TODAY  Action/Plan  Type 2 diabetes mellitus with other specified complication, with long-term current use of insulin (HCC) -     Tirzepatide ; Inject 2.5 mg into the skin once a week.  Dispense: 2 mL; Refill: 0. Side effects discussed  Patient is supposed to be taking NovoLog 5 units for breakfast, 10 units at lunch and 5 units at dinnertime.  However she decided to start taking 8 units 3 times a day to make it easier for her to remember.  She has had 2 episodes of hypoglycemia around midnight when she started taking 8 units TID.  I advised her to decrease back to 5 units at bedtime and  to let me know if she has any further episodes of hypoglycemia.  Needs to follow-up with PCP as well concerning her hypoglycemia and also when she makes any changes to her medications.  She needs to always keep her PCP updated with any medication changes that she needs.  Other disorder of eating -     buPROPion  HCl ER (SR); Take 1 tablet (150 mg total) by mouth daily.  Dispense: 90 tablet; Refill: 0. Side effects discussed.    Class 3 severe obesity due to excess calories with serious comorbidity and body mass index (BMI) of 50.0 to 59.9 in adult  Last labs were 07/05/2024     Return in about 4 weeks (around 08/10/2024).SABRA She was informed of the importance of frequent follow up visits to maximize her success with intensive lifestyle modifications for her multiple health conditions.   ATTESTASTION STATEMENTS:  Reviewed by clinician on day of visit: allergies, medications, problem list, medical history, surgical history, family history, social history, and previous encounter notes.     Corean SAUNDERS. Berl Bonfanti FNP-C

## 2024-07-20 ENCOUNTER — Ambulatory Visit: Attending: Cardiology | Admitting: Cardiology

## 2024-07-20 ENCOUNTER — Encounter: Payer: Self-pay | Admitting: Cardiology

## 2024-07-20 VITALS — BP 147/72 | HR 73 | Ht 64.0 in | Wt 300.0 lb

## 2024-07-20 DIAGNOSIS — E782 Mixed hyperlipidemia: Secondary | ICD-10-CM | POA: Insufficient documentation

## 2024-07-20 DIAGNOSIS — I6522 Occlusion and stenosis of left carotid artery: Secondary | ICD-10-CM | POA: Diagnosis present

## 2024-07-20 DIAGNOSIS — G4733 Obstructive sleep apnea (adult) (pediatric): Secondary | ICD-10-CM | POA: Insufficient documentation

## 2024-07-20 DIAGNOSIS — I350 Nonrheumatic aortic (valve) stenosis: Secondary | ICD-10-CM | POA: Insufficient documentation

## 2024-07-20 DIAGNOSIS — E1169 Type 2 diabetes mellitus with other specified complication: Secondary | ICD-10-CM | POA: Diagnosis present

## 2024-07-20 DIAGNOSIS — E1159 Type 2 diabetes mellitus with other circulatory complications: Secondary | ICD-10-CM | POA: Insufficient documentation

## 2024-07-20 DIAGNOSIS — I152 Hypertension secondary to endocrine disorders: Secondary | ICD-10-CM | POA: Insufficient documentation

## 2024-07-20 DIAGNOSIS — E669 Obesity, unspecified: Secondary | ICD-10-CM | POA: Diagnosis present

## 2024-07-20 MED ORDER — EZETIMIBE 10 MG PO TABS: 10.0000 mg | ORAL_TABLET | Freq: Every day | ORAL | 3 refills | Status: AC

## 2024-07-20 NOTE — Progress Notes (Signed)
 Cardiology Office Note:    Date:  07/20/2024   ID:  Bethany Lynch, DOB 1951-07-09, MRN [id number]  PCP:  Teressa Harrie HERO, FNP  Cardiologist:  Lamar Fitch, MD    Referring MD: Teressa Harrie HERO, FNP   Chief Complaint  Patient presents with   Follow-up    History of Present Illness:    Bethany Lynch is a 73 y.o. female past medical history significant for diabetes essential hypertension morbid obesity, carotic arterial disease, mild to moderate aortic stenosis.  She comes today 2 months for follow-up cardiac wise doing well.  Denies of any chest pain tightness squeezing pressure burning chest.  She does have chronic lymphedema that being managed by physical therapy with good response.  No dizziness no passing out  Past Medical History:  Diagnosis Date   Anxiety    Aortic stenosis 10/19/2019   Arthritis    Back pain    CFS (chronic fatigue syndrome)    Constipation    Diabetes (HCC)    Edema of both lower extremities    Essential hypertension 09/28/2018   GERD (gastroesophageal reflux disease)    Glaucoma    Hyperlipidemia 09/28/2018   IBS (irritable bowel syndrome)    Joint pain    Low energy    Lymphedema of lower extremity 01/02/2019   Palpitations    Shortness of breath 09/28/2018   SOB (shortness of breath)    Stenosis of left carotid artery 09/28/2018   Swallowing difficulty     Past Surgical History:  Procedure Laterality Date   GALLBLADDER SURGERY  07/27/1988   HERNIA REPAIR  04/04/2012   HERNIA REPAIR  09/09/2015   HYESTERECTOMY  02/06/1991    Current Medications: Current Meds  Medication Sig   amLODipine  (NORVASC ) 5 MG tablet Take 1 tablet (5 mg total) by mouth daily.   aspirin 81 MG chewable tablet Chew 81 mg by mouth daily.   buPROPion  (WELLBUTRIN  SR) 150 MG 12 hr tablet Take 1 tablet (150 mg total) by mouth daily.   Calcium  Carb-Cholecalciferol (CALCIUM  500/VITAMIN D ) 500-3.125 MG-MCG TABS Take 1 each by mouth daily.   cetirizine (ZYRTEC) 10 MG tablet  Take 10 mg by mouth daily.   Cholecalciferol 50 MCG (2000 UT) CAPS Take 1 capsule by mouth daily as needed (Patient determines when to take).   Continuous Glucose Sensor (FREESTYLE LIBRE 3 PLUS SENSOR) MISC Change sensor every 15 days.   dicyclomine  (BENTYL ) 20 MG tablet Take 1 tablet (20 mg total) by mouth every 6 (six) hours. PRN   doxycycline (PERIOSTAT) 20 MG tablet Take 20 mg by mouth 2 (two) times daily.   esomeprazole (NEXIUM) 40 MG capsule Take 40 mg by mouth daily.   ezetimibe  (ZETIA ) 10 MG tablet Take 1 tablet (10 mg total) by mouth daily.   FARXIGA 10 MG TABS tablet Take 10 mg by mouth daily.   furosemide  (LASIX ) 40 MG tablet Take 1 tablet (40 mg total) by mouth daily.   gabapentin  (NEURONTIN ) 100 MG capsule TAKE 1 CAPSULE (100 MG TOTAL) BY MOUTH 3 (THREE) TIMES DAILY AS NEEDED (NERVE PAIN).   hydrALAZINE (APRESOLINE) 50 MG tablet Take 1 tablet by mouth 3 (three) times daily.   LORazepam  (ATIVAN ) 0.5 MG tablet Take 1 tablet (0.5 mg total) by mouth 2 (two) times daily as needed for anxiety.   metFORMIN (GLUCOPHAGE) 500 MG tablet Take 1,000 mg by mouth 2 (two) times daily.   Misc Natural Products (TART CHERRY ADVANCED PO) Take 1 tablet by mouth daily as  needed (Patient determines when to take).   Multiple Vitamin (MULTIVITAMIN) tablet Take 1 tablet by mouth daily.   Multiple Vitamins-Minerals (VISION PLUS PO) Take 1 tablet by mouth daily.   NON FORMULARY Take 1 tablet by mouth See admin instructions. ORGANIC TUMERIC CURCUMIN  WITH GINGER AND BIOPERINE DIETARY SUPPLEMENT TAKE AS NEEDED   NOVOLOG FLEXPEN 100 UNIT/ML FlexPen Inject 5 Units into the muscle 3 (three) times daily with meals.   Omega-3 Fatty Acids (FISH OIL PO) Take 1,400 mg by mouth daily.   ondansetron  (ZOFRAN ) 4 MG tablet Take 1 tablet (4 mg total) by mouth every 8 (eight) hours as needed for nausea or vomiting.   OVER THE COUNTER MEDICATION Take 1 tablet by mouth as needed (loose stools).   rosuvastatin  (CRESTOR ) 10 MG  tablet Take 10 mg by mouth 3 (three) times a week.   Simethicone (GAS-X PO) Take 1 tablet by mouth as needed (Gas).   spironolactone  (ALDACTONE ) 25 MG tablet Take 1 tablet (25 mg total) by mouth daily. with food   telmisartan  (MICARDIS ) 80 MG tablet Take 1 tablet (80 mg total) by mouth daily.   tirzepatide  (MOUNJARO ) 2.5 MG/0.5ML Pen Inject 2.5 mg into the skin once a week.   TRESIBA FLEXTOUCH 200 UNIT/ML SOPN Inject 70 Units into the skin at bedtime.   vitamin E 400 UNIT capsule Take 400 Units by mouth daily.   zinc gluconate 50 MG tablet Take 50 mg by mouth daily.     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  Tobacco Use   Smoking status: Never   Smokeless tobacco: Never  Vaping Use   Vaping status: Never Used  Substance and Sexual Activity   Alcohol use: Never   Drug use: Never   Sexual activity: Not Currently  Other Topics Concern   Not on file  Social History Narrative   Not on file   Social Drivers of Health   Financial Resource Strain: Not on file  Food Insecurity: No Food Insecurity (05/02/2024)   Hunger Vital Sign    Worried About Running Out of Food in the Last Year: Never true    Ran Out of Food in the Last Year: Never true  Transportation Needs: No Transportation Needs (05/02/2024)   PRAPARE - Administrator, Civil Service (Medical): No    Lack of Transportation (Non-Medical): No  Physical Activity: Not on file  Stress: Not on file  Social Connections: Not on file     Family History: The patient's family history includes Colon cancer in her mother; Diabetes in her mother; Diabetes type II in her mother; Heart disease in her father; High Cholesterol in her father and mother; High blood  pressure in her father and mother; Leukemia in her father; Memory loss in her mother; Obesity in her father and mother; Sleep apnea in her father; Stroke in her father. ROS:   Please see the history of present illness.    All 14 point review of systems negative except as described per history of present illness  EKGs/Labs/Other Studies Reviewed:         Recent Labs: 07/05/2024: ALT 26; BUN 44; Creatinine, Ser 1.10; Hemoglobin 13.3; Platelets 325;  Potassium 5.0; Sodium 140; TSH 3.800  Recent Lipid Panel    Component Value Date/Time   CHOL 171 07/05/2024 0846   TRIG 154 (H) 07/05/2024 0846   HDL 52 07/05/2024 0846   CHOLHDL 3.3 07/05/2024 0846   LDLCALC 92 07/05/2024 0846    Physical Exam:    VS:  BP (!) 147/72   Pulse 73   Ht 5' 4 (1.626 m)   Wt 300 lb (136.1 kg)   SpO2 95%   BMI 51.49 kg/m     Wt Readings from Last 3 Encounters:  07/20/24 300 lb (136.1 kg)  07/13/24 294 lb (133.4 kg)  07/05/24 300 lb (136.1 kg)     GEN:  Well nourished, well developed in no acute distress HEENT: Normal NECK: No JVD; left carotid bruit LYMPHATICS: No lymphadenopathy CARDIAC: RRR, systolic ejection murmur grade 2/6 best heard right upper portion of the sternum, no rubs, no gallops RESPIRATORY:  Clear to auscultation without rales, wheezing or rhonchi  ABDOMEN: Soft, non-tender, non-distended MUSCULOSKELETAL:  No edema; No deformity  SKIN: Warm and dry LOWER EXTREMITIES: no swelling NEUROLOGIC:  Alert and oriented x 3 PSYCHIATRIC:  Normal affect   ASSESSMENT:    1. Stenosis of left carotid artery   2. Mixed hyperlipidemia   3. Nonrheumatic aortic valve stenosis   4. Hypertension associated with type 2 diabetes mellitus (HCC)   5. Obstructive sleep apnea   6. Type 2 diabetes mellitus with obesity (HCC)    PLAN:    In order of problems listed above:  Cardiac artery stenosis will repeat carotic ultrasound.  In the meantime continue present medications. Mixed dyslipidemia I  did review labs Essential hypertension blood pressure seems to be well-controlled continue present management. Diabetes mellitus that being followed by antimedicine team stable   Medication Adjustments/Labs and Tests Ordered: Current medicines are reviewed at length with the patient today.  Concerns regarding medicines are outlined above.  Orders Placed This Encounter  Procedures   ALT   AST   Lipid panel   VAS US  CAROTID   Medication changes:  Meds ordered this encounter  Medications   ezetimibe  (ZETIA ) 10 MG tablet    Sig: Take 1 tablet (10 mg total) by mouth daily.    Dispense:  90 tablet    Refill:  3    Signed, Lamar DOROTHA Fitch, MD, [facility] 07/20/2024 4:51 PM    [facility]

## 2024-07-20 NOTE — Patient Instructions (Signed)
 Medication Instructions:   START: Zetia  10mg  1 tablet daily   Lab Work: Your physician recommends that you return for lab work in: 6 weeks You need to have labs done when you are fasting.  You can come Monday through Friday 8:30 am to 12:00 pm and 1:15 to 4:30. You do not need to make an appointment as the order has already been placed. The labs you are going to have done are Ast, ALT Lipids.    Testing/Procedures: Your physician has requested that you have a carotid duplex. This test is an ultrasound of the carotid arteries in your neck. It looks at blood flow through these arteries that supply the brain with blood. Allow one hour for this exam. There are no restrictions or special instructions.    Follow-Up: At [facility], you and your health needs are our priority.  As part of our continuing mission to provide you with exceptional heart care, we have created designated Provider Care Teams.  These Care Teams include your primary Cardiologist (physician) and Advanced Practice Providers (APPs -  Physician Assistants and Nurse Practitioners) who all work together to provide you with the care you need, when you need it.  We recommend signing up for the patient portal called MyChart.  Sign up information is provided on this After Visit Summary.  MyChart is used to connect with patients for Virtual Visits (Telemedicine).  Patients are able to view lab/test results, encounter notes, upcoming appointments, etc.  Non-urgent messages can be sent to your provider as well.   To learn more about what you can do with MyChart, go to [email or web address].    Your next appointment:   6 month(s)  The format for your next appointment:   In Person  Provider:   Lamar Fitch, MD    Other Instructions NA

## 2024-07-23 ENCOUNTER — Other Ambulatory Visit: Payer: Self-pay | Admitting: Family Medicine

## 2024-07-29 ENCOUNTER — Other Ambulatory Visit: Payer: Self-pay | Admitting: Family Medicine

## 2024-07-29 DIAGNOSIS — K580 Irritable bowel syndrome with diarrhea: Secondary | ICD-10-CM

## 2024-08-01 DIAGNOSIS — E1159 Type 2 diabetes mellitus with other circulatory complications: Secondary | ICD-10-CM

## 2024-08-01 MED ORDER — FREESTYLE LIBRE 3 PLUS SENSOR MISC: 3 refills | Status: DC

## 2024-08-09 ENCOUNTER — Ambulatory Visit (INDEPENDENT_AMBULATORY_CARE_PROVIDER_SITE_OTHER): Admitting: Nurse Practitioner

## 2024-08-09 ENCOUNTER — Encounter: Payer: Self-pay | Admitting: Nurse Practitioner

## 2024-08-09 VITALS — BP 126/66 | HR 58 | Temp 97.7°F | Ht 64.0 in | Wt 294.0 lb

## 2024-08-09 DIAGNOSIS — E1169 Type 2 diabetes mellitus with other specified complication: Secondary | ICD-10-CM

## 2024-08-09 DIAGNOSIS — Z794 Long term (current) use of insulin: Secondary | ICD-10-CM

## 2024-08-09 DIAGNOSIS — E66813 Obesity, class 3: Secondary | ICD-10-CM

## 2024-08-09 DIAGNOSIS — Z6841 Body Mass Index (BMI) 40.0 and over, adult: Secondary | ICD-10-CM

## 2024-08-09 DIAGNOSIS — Z7985 Long-term (current) use of injectable non-insulin antidiabetic drugs: Secondary | ICD-10-CM

## 2024-08-09 MED ORDER — TIRZEPATIDE 2.5 MG/0.5ML ~~LOC~~ SOAJ: 2.5000 mg | SUBCUTANEOUS | 0 refills | Status: DC

## 2024-08-09 NOTE — Progress Notes (Signed)
 Office: [phone]  /  Fax: [phone]  WEIGHT SUMMARY AND BIOMETRICS  Weight Lost Since Last Visit: 0  Weight Gained Since Last Visit: 0   Vitals Temp: 97.7 F (36.5 C) BP: 126/66 Pulse Rate: (!) 58 SpO2: 95 %   Anthropometric Measurements Height: 5' 4 (1.626 m) Weight: 294 lb (133.4 kg) BMI (Calculated): 50.44 Weight at Last Visit: 294lb Weight Lost Since Last Visit: 0 Weight Gained Since Last Visit: 0 Starting Weight: 339lb Total Weight Loss (lbs): 45 lb (20.4 kg)   Body Composition  Body Fat %: 57.9 % Fat Mass (lbs): 170.4 lbs Muscle Mass (lbs): 117.8 lbs Visceral Fat Rating : 24   Other Clinical Data Fasting: no Labs: no Today's Visit #: 48 Starting Date: 01/28/21     HPI  Chief Complaint: OBESITY  Matrice is here to discuss her progress with her obesity treatment plan. She is on the the Category 2 Plan and states she is following her eating plan approximately 50 % of the time. She states she is exercising 0 minutes 0 days per week.   Interval History:  Since last office visit she has maintained her weight.  [place] the [place].  She is eating 3 meals per day and 2 snacks per day.  She is drinking 3-4 diet sodas and water daily.    She is going on a cruise next week  Pharmacotherapy for weight loss: She is currently taking Wellbutrin  SR 150mg  for disorder of eating.  Denies side effects.     Previous pharmacotherapy for medical weight loss:  None   Bariatric surgery:  Patient has not had bariatric   Pharmacotherapy for DMT2:   She is taking Mounjaro  2.5mg , Farxiga, Metformin 1000 mg BID, Tresiba 70 units pm and Novolog 5 units BF, 5 units lunch and 5 units dinner.  Denies side effects.  Saw cardiology last on 2/7 and PCP last on 07/05/24     Last A1c was 6.4 on 07/05/24 CBGs:  average 132 for 30 days and 131 over 7 days Episodes of hypoglycemia: 10 over the past 30 days-54-69 On ASA 81mg  and statin.  Last eye exam:  Dec  16th, 2024-sched 11/15/24 She has tried Victoza in the past and stopped due to side effects.    Lab Results  Component Value Date   HGBA1C 6.4 (H) 07/05/2024   HGBA1C 6.0 10/04/2023   HGBA1C 6.2 (H) 02/23/2022   Lab Results  Component Value Date   LDLCALC 92 07/05/2024   CREATININE 1.10 (H) 07/05/2024       PHYSICAL EXAM:  Blood pressure 126/66, pulse (!) 58, temperature 97.7 F (36.5 C), height 5' 4 (1.626 m), weight 294 lb (133.4 kg), SpO2 95%. Body mass index is 50.46 kg/m.  General: She is overweight, cooperative, alert, well developed, and in no acute distress. PSYCH: Has normal mood, affect and thought process.   Extremities: No edema.  Neurologic: No gross sensory or motor deficits. No tremors or fasciculations noted.    DIAGNOSTIC DATA REVIEWED:  BMET    Component Value Date/Time   NA 140 07/05/2024 0846   K 5.0 07/05/2024 0846   CL 102 07/05/2024 0846   CO2 21 07/05/2024 0846   GLUCOSE 145 (H) 07/05/2024 0846   BUN 44 (H) 07/05/2024 0846   CREATININE 1.10 (H) 07/05/2024 0846   CALCIUM  10.6 (H) 07/05/2024 0846   GFRNONAA 44 (L) 01/21/2021 1324   GFRAA 51 (L) 01/21/2021 1324   Lab Results  Component Value Date  HGBA1C 6.4 (H) 07/05/2024   HGBA1C 6.3 (H) 01/28/2021   Lab Results  Component Value Date   INSULIN 3.5 02/23/2022   INSULIN 3.7 01/28/2021   Lab Results  Component Value Date   TSH 3.800 07/05/2024   CBC    Component Value Date/Time   WBC 6.2 07/05/2024 0846   RBC 4.60 07/05/2024 0846   HGB 13.3 07/05/2024 0846   HCT 40.8 07/05/2024 0846   PLT 325 07/05/2024 0846   MCV 89 07/05/2024 0846   MCH 28.9 07/05/2024 0846   MCHC 32.6 07/05/2024 0846   RDW 13.1 07/05/2024 0846   Iron Studies    Component Value Date/Time   FERRITIN 27 09/17/2022 1515   Lipid Panel     Component Value Date/Time   CHOL 171 07/05/2024 0846   TRIG 154 (H) 07/05/2024 0846   HDL 52 07/05/2024 0846   CHOLHDL 3.3 07/05/2024 0846   LDLCALC 92  07/05/2024 0846   Hepatic Function Panel     Component Value Date/Time   PROT 6.9 07/05/2024 0846   ALBUMIN 4.5 07/05/2024 0846   AST 19 07/05/2024 0846   ALT 26 07/05/2024 0846   ALKPHOS 75 07/05/2024 0846   BILITOT 0.2 07/05/2024 0846      Component Value Date/Time   TSH 3.800 07/05/2024 0846   Nutritional Lab Results  Component Value Date   VD25OH 53.9 07/05/2024   VD25OH 46.3 09/17/2022   VD25OH 51.6 02/23/2022     ASSESSMENT AND PLAN  TREATMENT PLAN FOR OBESITY:  Recommended Dietary Goals  Birtie is currently in the action stage of change. As such, her goal is to continue weight management plan. She has agreed to practicing portion control and making smarter food choices, such as increasing vegetables and decreasing simple carbohydrates.  Behavioral Intervention  We discussed the following Behavioral Modification Strategies today: increasing lean protein intake to established goals, decreasing simple carbohydrates , increasing vegetables, increasing fiber rich foods, avoiding skipping meals, increasing water intake , work on meal planning and preparation, reading food labels , keeping healthy foods at home, continue to work on maintaining a reduced calorie state, getting the recommended amount of protein, incorporating whole foods, making healthy choices, staying well hydrated and practicing mindfulness when eating., and increase protein intake, fibrous foods (25 grams per day for women, 30 grams for men) and water to improve satiety and decrease hunger signals. .  Additional resources provided today: NA  Recommended Physical Activity Goals  Arlene has been advised to work up to 150 minutes of moderate intensity aerobic activity a week and strengthening exercises 2-3 times per week for cardiovascular health, weight loss maintenance and preservation of muscle mass.   She has agreed to Think about enjoyable ways to increase daily physical activity and overcoming  barriers to exercise, Increase physical activity in their day and reduce sedentary time (increase NEAT)., Work on scheduling and tracking physical activity. , and Combine aerobic and strengthening exercises for efficiency and improved cardiometabolic health.  ASSOCIATED CONDITIONS ADDRESSED TODAY  Action/Plan  Type 2 diabetes mellitus with other specified complication, with long-term current use of insulin (HCC) -     Tirzepatide ; Inject 2.5 mg into the skin once a week.  Dispense: 2 mL; Refill: 0 -     Amb ref to Medical Nutrition Therapy-MNT  I am concerned about her frequent episodes of hypoglycemia.  I will have her check BS at home with any lows noted on her monitor and to let me know.  To send  me blood sugar readings via MyChart next week.  She needs to follow-up with her PCP.  Discuss endo referral.    Class 3 severe obesity due to excess calories with serious comorbidity and body mass index (BMI) of 50.0 to 59.9 in adult         Return in about 4 weeks (around 09/06/2024).SABRA She was informed of the importance of frequent follow up visits to maximize her success with intensive lifestyle modifications for her multiple health conditions.   ATTESTASTION STATEMENTS:  Reviewed by clinician on day of visit: allergies, medications, problem list, medical history, surgical history, family history, social history, and previous encounter notes.     Corean SAUNDERS. Rukia Mcgillivray FNP-C

## 2024-08-21 ENCOUNTER — Ambulatory Visit: Attending: Cardiology

## 2024-08-21 DIAGNOSIS — I6522 Occlusion and stenosis of left carotid artery: Secondary | ICD-10-CM | POA: Insufficient documentation

## 2024-08-22 ENCOUNTER — Ambulatory Visit: Payer: Self-pay | Admitting: Cardiology

## 2024-08-23 ENCOUNTER — Other Ambulatory Visit: Payer: Self-pay | Admitting: Family Medicine

## 2024-08-23 DIAGNOSIS — F419 Anxiety disorder, unspecified: Secondary | ICD-10-CM

## 2024-08-23 MED ORDER — HYDRALAZINE HCL 50 MG PO TABS: 50.0000 mg | ORAL_TABLET | Freq: Three times a day (TID) | ORAL | 0 refills | Status: DC

## 2024-08-24 ENCOUNTER — Other Ambulatory Visit: Payer: Self-pay | Admitting: Family Medicine

## 2024-08-24 ENCOUNTER — Telehealth: Payer: Self-pay

## 2024-08-24 NOTE — Telephone Encounter (Signed)
 Left message on My Chart with [place] results per Dr. Vanetta Shawl note. Routed to PCP.

## 2024-08-24 NOTE — Telephone Encounter (Signed)
 Med refill

## 2024-08-24 NOTE — Telephone Encounter (Signed)
 Copied from CRM #[id number]. Topic: Clinical - Medication Refill >> Aug 24, 2024 11:37 AM Zebedee SAUNDERS wrote: Medication: hydrALAZINE  (APRESOLINE ) 50 MG tablet  Has the patient contacted their pharmacy? Yes (Agent: If no, request that the patient contact the pharmacy for the refill. If patient does not wish to contact the pharmacy document the reason why and proceed with request.) (Agent: If yes, when and what did the pharmacy advise?)Pharmacy need PCP approval.   This is the patient's preferred pharmacy:  [id number] - [place], [place] - [address] AT [facility] [address] [place] [address] Phone: [phone] Fax: [phone]  Is this the correct pharmacy for this prescription? Yes If no, delete pharmacy and type the correct one.   Has the prescription been filled recently? Yes  Is the patient out of the medication? Yes  Has the patient been seen for an appointment in the last year OR does the patient have an upcoming appointment? Yes  Can we respond through MyChart? Yes  Agent: Please be advised that Rx refills may take up to 3 business days. We ask that you follow-up with your pharmacy.

## 2024-08-28 ENCOUNTER — Encounter: Payer: Self-pay | Admitting: Nurse Practitioner

## 2024-08-29 ENCOUNTER — Telehealth: Payer: Self-pay

## 2024-08-29 NOTE — Telephone Encounter (Signed)
 Pt viewed Carotid US  results on My Chart per Dr. Karry note. Routed to PCP.

## 2024-08-30 ENCOUNTER — Ambulatory Visit: Admitting: Dietician

## 2024-09-06 LAB — LIPID PANEL
Chol/HDL Ratio: 2.7 ratio (ref 0.0–4.4)
Cholesterol, Total: 127 mg/dL (ref 100–199)
HDL: 47 mg/dL (ref >39)
LDL Chol Calc (NIH): 60 mg/dL (ref 0–99)
Triglycerides: 112 mg/dL (ref 0–149)
VLDL Cholesterol Cal: 20 mg/dL (ref 5–40)

## 2024-09-06 LAB — AST: AST: 19 IU/L (ref 0–40)

## 2024-09-06 LAB — ALT: ALT: 23 IU/L (ref 0–32)

## 2024-09-07 ENCOUNTER — Other Ambulatory Visit: Payer: Self-pay | Admitting: Family Medicine

## 2024-09-07 ENCOUNTER — Encounter: Payer: Self-pay | Admitting: Nurse Practitioner

## 2024-09-07 ENCOUNTER — Ambulatory Visit: Admitting: Nurse Practitioner

## 2024-09-07 VITALS — BP 148/78 | HR 55 | Temp 98.0°F | Ht 64.0 in | Wt 299.0 lb

## 2024-09-07 DIAGNOSIS — E1169 Type 2 diabetes mellitus with other specified complication: Secondary | ICD-10-CM | POA: Diagnosis not present

## 2024-09-07 DIAGNOSIS — M25562 Pain in left knee: Secondary | ICD-10-CM | POA: Diagnosis not present

## 2024-09-07 DIAGNOSIS — G8929 Other chronic pain: Secondary | ICD-10-CM

## 2024-09-07 DIAGNOSIS — Z794 Long term (current) use of insulin: Secondary | ICD-10-CM

## 2024-09-07 DIAGNOSIS — Z6841 Body Mass Index (BMI) 40.0 and over, adult: Secondary | ICD-10-CM

## 2024-09-07 DIAGNOSIS — E66813 Obesity, class 3: Secondary | ICD-10-CM | POA: Diagnosis not present

## 2024-09-07 DIAGNOSIS — Z7985 Long-term (current) use of injectable non-insulin antidiabetic drugs: Secondary | ICD-10-CM

## 2024-09-07 MED ORDER — FARXIGA 10 MG PO TABS: 10.0000 mg | ORAL_TABLET | Freq: Every day | ORAL | 1 refills | Status: DC

## 2024-09-07 MED ORDER — FUROSEMIDE 40 MG PO TABS: 40.0000 mg | ORAL_TABLET | Freq: Every day | ORAL | 0 refills | Status: DC

## 2024-09-07 MED ORDER — ESOMEPRAZOLE MAGNESIUM 40 MG PO CPDR: 40.0000 mg | DELAYED_RELEASE_CAPSULE | Freq: Every day | ORAL | 0 refills | Status: DC

## 2024-09-07 MED ORDER — TIRZEPATIDE 2.5 MG/0.5ML ~~LOC~~ SOAJ: 2.5000 mg | SUBCUTANEOUS | 0 refills | Status: DC

## 2024-09-07 NOTE — Progress Notes (Signed)
 Office: [phone]  /  Fax: [phone]  WEIGHT SUMMARY AND BIOMETRICS  Weight Lost Since Last Visit: 0lb  Weight Gained Since Last Visit: 5lb   Vitals Temp: 98 F (36.7 C) BP: (!) 148/64 Pulse Rate: (!) 55 SpO2: 98 %   Anthropometric Measurements Height: 5' 4 (1.626 m) Weight: 299 lb (135.6 kg) BMI (Calculated): 51.3 Weight at Last Visit: 294lb Weight Lost Since Last Visit: 0lb Weight Gained Since Last Visit: 5lb Starting Weight: 339lb Total Weight Loss (lbs): 40 lb (18.1 kg)   No data recorded Other Clinical Data Fasting: No Labs: No Today's Visit #: 61 Starting Date: 01/28/21     HPI  Chief Complaint: OBESITY  Bethany Lynch is here to discuss her progress with her obesity treatment plan. She is on the the Category 2 Plan and states she is following her eating plan approximately 20 % of the time. She states she is exercising 45 minutes 1 days per week.   Interval History:  Since last office visit she has gained 5 pounds. She went on a cruise since her last visit.  She fell while she was on the cruise and recently had a steroid injection in her left knee.  She is walking with the aid of cane.  She wants to lose weight to be able to proceed with surgery.  Her leg hurts and she notes she sits a lot.  She did the best when she did PT in the past.     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  Denies side effects.     Previous pharmacotherapy for medical weight loss:  None   Bariatric surgery:  Patient has not had bariatric   Pharmacotherapy for DMT2:   I had referred her to RD after her last visit.  She scheduled an appt but it got cancelled due to RD being sick. Plans to call and resched appt.   She is taking Mounjaro  2.5mg , Farxiga, Metformin 1000 mg BID, Tresiba 70 units pm and Novolog 5 units BF, 5 units lunch and 5 units dinner (will skip if sugars are < 120).  Reports side effects of some nausea.   Saw cardiology last on 2/7 and PCP last on 07/05/24     Last A1c was 6.4 on 07/05/24 CBGs:  Average 136 over the past 30 days Episodes of hypoglycemia: None since Sept 23rd from 08/09/24-08/15/22-4 lows-two were in one night On ASA 81mg  and Crestor  10mg .   Last eye exam:  Dec 16th, 2024-sched 11/15/24 She has tried Victoza in the past and stopped due to side effects.   Lab Results  Component Value Date   HGBA1C 6.4 (H) 07/05/2024   HGBA1C 6.0 10/04/2023   HGBA1C 6.2 (H) 02/23/2022   Lab Results  Component Value Date   LDLCALC 60 09/05/2024   CREATININE 1.10 (H) 07/05/2024      PHYSICAL EXAM:  Blood pressure (!) 148/64, pulse (!) 55, temperature 98 F (36.7 C), height 5' 4 (1.626 m), weight 299 lb (135.6 kg), SpO2 98%. Body mass index is 51.32 kg/m.  General: She is overweight, cooperative, alert, well developed, and in no acute distress. PSYCH: Has normal mood, affect and thought process.   Extremities: No edema.  Neurologic: No gross sensory or motor deficits. No tremors or fasciculations noted.    DIAGNOSTIC DATA REVIEWED:  BMET    Component Value Date/Time   NA 140 07/05/2024 0846   K 5.0 07/05/2024 0846   CL  102 07/05/2024 0846   CO2 21 07/05/2024 0846   GLUCOSE 145 (H) 07/05/2024 0846   BUN 44 (H) 07/05/2024 0846   CREATININE 1.10 (H) 07/05/2024 0846   CALCIUM  10.6 (H) 07/05/2024 0846   GFRNONAA 44 (L) 01/21/2021 1324   GFRAA 51 (L) 01/21/2021 1324   Lab Results  Component Value Date   HGBA1C 6.4 (H) 07/05/2024   HGBA1C 6.3 (H) 01/28/2021   Lab Results  Component Value Date   INSULIN 3.5 02/23/2022   INSULIN 3.7 01/28/2021   Lab Results  Component Value Date   TSH 3.800 07/05/2024   CBC    Component Value Date/Time   WBC 6.2 07/05/2024 0846   RBC 4.60 07/05/2024 0846   HGB 13.3 07/05/2024 0846   HCT 40.8 07/05/2024 0846   PLT 325 07/05/2024 0846   MCV 89 07/05/2024 0846   MCH 28.9 07/05/2024 0846   MCHC 32.6 07/05/2024 0846   RDW 13.1  07/05/2024 0846   Iron Studies    Component Value Date/Time   FERRITIN 27 09/17/2022 1515   Lipid Panel     Component Value Date/Time   CHOL 127 09/05/2024 0949   TRIG 112 09/05/2024 0949   HDL 47 09/05/2024 0949   CHOLHDL 2.7 09/05/2024 0949   LDLCALC 60 09/05/2024 0949   Hepatic Function Panel     Component Value Date/Time   PROT 6.9 07/05/2024 0846   ALBUMIN 4.5 07/05/2024 0846   AST 19 09/05/2024 0949   ALT 23 09/05/2024 0949   ALKPHOS 75 07/05/2024 0846   BILITOT 0.2 07/05/2024 0846      Component Value Date/Time   TSH 3.800 07/05/2024 0846   Nutritional Lab Results  Component Value Date   VD25OH 53.9 07/05/2024   VD25OH 46.3 09/17/2022   VD25OH 51.6 02/23/2022     ASSESSMENT AND PLAN  TREATMENT PLAN FOR OBESITY:  Recommended Dietary Goals  Bethany Lynch is currently in the action stage of change. As such, her goal is to continue weight management plan. She has agreed to the Category 2 Plan.  Behavioral Intervention  We discussed the following Behavioral Modification Strategies today: increasing lean protein intake to established goals, decreasing simple carbohydrates , increasing vegetables, increasing fiber rich foods, increasing water intake , work on meal planning and preparation, work on tracking and journaling calories using tracking application, reading food labels , keeping healthy foods at home, planning for success, continue to work on maintaining a reduced calorie state, getting the recommended amount of protein, incorporating whole foods, making healthy choices, staying well hydrated and practicing mindfulness when eating., and increase protein intake, fibrous foods (25 grams per day for women, 30 grams for men) and water to improve satiety and decrease hunger signals. .  Additional resources provided today: NA  Recommended Physical Activity Goals  Bethany Lynch has been advised to work up to 150 minutes of moderate intensity aerobic activity a week and  strengthening exercises 2-3 times per week for cardiovascular health, weight loss maintenance and preservation of muscle mass.   She has agreed to Think about enjoyable ways to increase daily physical activity and overcoming barriers to exercise, Increase physical activity in their day and reduce sedentary time (increase NEAT)., Work on scheduling and tracking physical activity. , Continue to gradually increase the amount and intensity of exercise routine, and Combine aerobic and strengthening exercises for efficiency and improved cardiometabolic health.  ASSOCIATED CONDITIONS ADDRESSED TODAY  Action/Plan  Type 2 diabetes mellitus with other specified complication, with long-term current use of  insulin (HCC) -     Tirzepatide ; Inject 2.5 mg into the skin once a week.  Dispense: 2 mL; Refill: 0  Stop 5 units at bedtime.  To send me readings in one week.    Needs to sched appt with RD.   Chronic pain of left knee -     Ambulatory referral to Physical Therapy  Class 3 severe obesity due to excess calories with serious comorbidity and body mass index (BMI) of 50.0 to 59.9 in adult [facility])         Return in about 4 weeks (around 10/05/2024).Bethany Lynch She was informed of the importance of frequent follow up visits to maximize her success with intensive lifestyle modifications for her multiple health conditions.   ATTESTASTION STATEMENTS:  Reviewed by clinician on day of visit: allergies, medications, problem list, medical history, surgical history, family history, social history, and previous encounter notes.    Bethany Lynch. Adelayde Minney FNP-C

## 2024-09-07 NOTE — Telephone Encounter (Signed)
 Copied from CRM [id number]. Topic: Clinical - Medication Refill >> Sep 07, 2024 12:53 PM Winona R wrote: Medication: 90 day supply if possible  furosemide  (LASIX ) 40 MG tablet esomeprazole (NEXIUM) 40 MG capsule FARXIGA 10 MG TABS tablet   Has the patient contacted their pharmacy? Yes, they need PCP name on them  (Agent: If no, request that the patient contact the pharmacy for the refill. If patient does not wish to contact the pharmacy document the reason why and proceed with request.) (Agent: If yes, when and what did the pharmacy advise?)  This is the patient's preferred pharmacy:  [id number] - [place], [place] - [address] AT [facility] [address] [place] [address] Phone: [phone] Fax: [phone]  Is this the correct pharmacy for this prescription? Yes If no, delete pharmacy and type the correct one.   Has the prescription been filled recently? No  Is the patient out of the medication? yes  Has the patient been seen for an appointment in the last year OR does the patient have an upcoming appointment? Yes  Can we respond through MyChart? Yes  Agent: Please be advised that Rx refills may take up to 3 business days. We ask that you follow-up with your pharmacy.

## 2024-09-20 ENCOUNTER — Telehealth: Payer: Self-pay

## 2024-09-20 NOTE — Telephone Encounter (Signed)
 Left message on My Chart with lab results per Dr. Karry note. Routed to PCP

## 2024-09-21 ENCOUNTER — Other Ambulatory Visit: Payer: Self-pay | Admitting: Family Medicine

## 2024-09-25 ENCOUNTER — Telehealth: Payer: Self-pay

## 2024-09-25 NOTE — Telephone Encounter (Signed)
 Lab Results reviewed with pt as per Dr. Karry note.  Pt verbalized understanding and had no additional questions. Routed to PCP

## 2024-10-06 ENCOUNTER — Ambulatory Visit (INDEPENDENT_AMBULATORY_CARE_PROVIDER_SITE_OTHER): Admitting: Family Medicine

## 2024-10-06 ENCOUNTER — Encounter: Payer: Self-pay | Admitting: Family Medicine

## 2024-10-06 VITALS — BP 112/64 | HR 60 | Temp 97.8°F | Ht 64.0 in | Wt 296.8 lb

## 2024-10-06 DIAGNOSIS — E782 Mixed hyperlipidemia: Secondary | ICD-10-CM

## 2024-10-06 DIAGNOSIS — E1159 Type 2 diabetes mellitus with other circulatory complications: Secondary | ICD-10-CM | POA: Diagnosis not present

## 2024-10-06 DIAGNOSIS — Z1231 Encounter for screening mammogram for malignant neoplasm of breast: Secondary | ICD-10-CM

## 2024-10-06 DIAGNOSIS — R221 Localized swelling, mass and lump, neck: Secondary | ICD-10-CM | POA: Diagnosis not present

## 2024-10-06 DIAGNOSIS — M1711 Unilateral primary osteoarthritis, right knee: Secondary | ICD-10-CM

## 2024-10-06 DIAGNOSIS — K219 Gastro-esophageal reflux disease without esophagitis: Secondary | ICD-10-CM | POA: Diagnosis not present

## 2024-10-06 DIAGNOSIS — I152 Hypertension secondary to endocrine disorders: Secondary | ICD-10-CM

## 2024-10-06 MED ORDER — METFORMIN HCL 500 MG PO TABS: 1000.0000 mg | ORAL_TABLET | Freq: Two times a day (BID) | ORAL | 2 refills | Status: AC

## 2024-10-06 MED ORDER — GVOKE HYPOPEN 2-PACK 0.5 MG/0.1ML ~~LOC~~ SOAJ: 0.5000 mg | Freq: Every day | SUBCUTANEOUS | 2 refills | Status: AC | PRN

## 2024-10-06 MED ORDER — ESOMEPRAZOLE MAGNESIUM 40 MG PO CPDR: 40.0000 mg | DELAYED_RELEASE_CAPSULE | Freq: Every day | ORAL | 1 refills | Status: AC

## 2024-10-06 MED ORDER — FARXIGA 10 MG PO TABS: 10.0000 mg | ORAL_TABLET | Freq: Every day | ORAL | 1 refills | Status: AC

## 2024-10-06 NOTE — Assessment & Plan Note (Addendum)
 Massage therapist reports possible reduction in size. - Continue monitoring neck mass for changes. - Order neck ultrasound to evaluate the mass in 3 months for continued FU. - Recheck thyroid  labs for specialist Orders:   TSH+T4F+T3Free+ThyAbs+TPO+VD25-[facility]

## 2024-10-06 NOTE — Progress Notes (Signed)
 Subjective:  Patient ID: Bethany Lynch, female    DOB: 1950/12/19  Age: 73 y.o. MRN: [id number]  Chief Complaint  Patient presents with   Medical Management of Chronic Issues    Discussed the use of AI scribe software for clinical note transcription with the patient, who gave verbal consent to proceed.  History of Present Illness   Bethany Lynch is a 73 year old female with diabetes and hypertension who presents for management of chronic issues.  Diabetes - Frequent episodes of nocturnal hypoglycemia, with blood glucose as low as 54 mg/dL - Manages hypoglycemia with orange juice or milk - Recent adjustment of T[place] insulin from 70 units to 40 units at night - Novolog regimen: 5 units at breakfast, 10 units at lunch, 5 units at dinner - Recent blood glucose readings elevated since insulin adjustment: 149 mg/dL and 812 mg/dL - Metformin 1000 mg twice daily and Mounjaro  2.5 mg; did not increase Mounjaro  due to recent stomach  - Difficulty performing self-foot checks; relies on pedicurist for monitoring - Upcoming eye exam in December for glaucoma and diabetic retinopathy surveillance   issue  Left knee pain and functional impairment - Chronic knee pain attributed to 'bone on bone' grinding - Pain worsened following a fall during a cruise - Persistent discoloration and palpable knot in the knee - Initial use of ice, currently using heat for symptom relief - Unable to undergo knee replacement surgery until BMI decreases - Actively attempting weight loss and considering aquatic therapy to improve balance and ambulation  Hypertension and cardiovascular risk management - Medication regimen includes amlodipine  5 mg, Lasix , telmisartan  80 mg, hydralazine  50 mg three times daily, and spironolactone  25 mg. - Crestor  10 mg three times weekly and Zetia  10 mg for cholesterol management  Gastrointestinal symptoms - History of explosive diarrhea, recently improved - esomeprazole for reflux,  managed also with papaya enzyme and licorice - Bentyl  20 mg used as needed        10/06/2024    7:59 AM 07/05/2024    8:05 AM 05/01/2024    3:08 PM 01/28/2021   10:04 AM  Depression screen PHQ 2/9  Decreased Interest 0 1 1 3   Down, Depressed, Hopeless 0 1 1 1   PHQ - 2 Score 0 2 2 4   Altered sleeping  1 1 3   Tired, decreased energy  2 1 3   Change in appetite  1 1 1   Feeling bad or failure about yourself   0 1 2  Trouble concentrating  1 1 2   Moving slowly or fidgety/restless  0 1 2  Suicidal thoughts  0 1 1  PHQ-9 Score  7  9  18    Difficult doing work/chores  Not difficult at all Somewhat difficult Very difficult     Data saved with a previous flowsheet row definition        10/06/2024    7:59 AM  Fall Risk   Falls in the past year? 1  Number falls in past yr: 0  Injury with Fall? 1  Risk for fall due to : History of fall(s)  Follow up Falls evaluation completed    Patient Care Team: Teressa Harrie HERO, FNP as PCP - General (Family Medicine)   Review of Systems  Constitutional:  Negative for chills, fatigue and fever.  HENT:  Negative for congestion, ear pain, sinus pressure and sore throat.   Respiratory:  Negative for cough and shortness of breath.   Cardiovascular:  Negative for chest pain.  Gastrointestinal:  Positive for diarrhea (improved). Negative for abdominal pain, constipation, nausea and vomiting.  Genitourinary:  Negative for dysuria and frequency.  Musculoskeletal:  Negative for arthralgias, back pain and myalgias.  Neurological:  Negative for dizziness and headaches.  Psychiatric/Behavioral:  Negative for dysphoric mood. The patient is not nervous/anxious.     Current Outpatient Medications on File Prior to Visit  Medication Sig Dispense Refill   amLODipine  (NORVASC ) 5 MG tablet Take 1 tablet (5 mg total) by mouth daily. 90 tablet 2   aspirin 81 MG chewable tablet Chew 81 mg by mouth daily.     buPROPion  (WELLBUTRIN  SR) 150 MG 12 hr tablet Take 1 tablet  (150 mg total) by mouth daily. 90 tablet 0   Calcium  Carb-Cholecalciferol (CALCIUM  500/VITAMIN D ) 500-3.125 MG-MCG TABS Take 1 each by mouth daily.     cetirizine (ZYRTEC) 10 MG tablet Take 10 mg by mouth daily.     Cholecalciferol 50 MCG (2000 UT) CAPS Take 1 capsule by mouth daily as needed (Patient determines when to take).     Continuous Glucose Sensor (FREESTYLE LIBRE 3 PLUS SENSOR) MISC Change sensor every 15 days. 6 each 3   dicyclomine  (BENTYL ) 20 MG tablet TAKE 1 TABLET (20 MG TOTAL) BY MOUTH EVERY 6 (SIX) HOURS AS NEEDED 360 tablet 1   doxycycline (PERIOSTAT) 20 MG tablet Take 20 mg by mouth 2 (two) times daily.     ezetimibe  (ZETIA ) 10 MG tablet Take 1 tablet (10 mg total) by mouth daily. 90 tablet 3   furosemide  (LASIX ) 40 MG tablet Take 1 tablet (40 mg total) by mouth daily. 90 tablet 0   gabapentin  (NEURONTIN ) 100 MG capsule TAKE 1 CAPSULE (100 MG TOTAL) BY MOUTH 3 (THREE) TIMES DAILY AS NEEDED (NERVE PAIN). 90 capsule 3   hydrALAZINE  (APRESOLINE ) 50 MG tablet TAKE 1 TABLET BY MOUTH THREE TIMES A DAY 270 tablet 1   LORazepam  (ATIVAN ) 0.5 MG tablet TAKE 1 TABLET BY MOUTH 2 TIMES DAILY AS NEEDED FOR ANXIETY. 30 tablet 2   MAGNESIUM PO Take by mouth.     Misc Natural Products (TART CHERRY ADVANCED PO) Take 1 tablet by mouth daily as needed (Patient determines when to take).     Multiple Vitamin (MULTIVITAMIN) tablet Take 1 tablet by mouth daily.     Multiple Vitamins-Minerals (VISION PLUS PO) Take 1 tablet by mouth daily.     NON FORMULARY Take 1 tablet by mouth See admin instructions. ORGANIC TUMERIC CURCUMIN  WITH GINGER AND BIOPERINE DIETARY SUPPLEMENT TAKE AS NEEDED     NOVOLOG FLEXPEN 100 UNIT/ML FlexPen Inject 5 Units into the muscle 3 (three) times daily with meals.  1   Omega-3 Fatty Acids (FISH OIL PO) Take 1,400 mg by mouth daily.     ondansetron  (ZOFRAN ) 4 MG tablet Take 1 tablet (4 mg total) by mouth every 8 (eight) hours as needed for nausea or vomiting. 20 tablet 1   OVER  THE COUNTER MEDICATION Take 1 tablet by mouth as needed (loose stools).     rosuvastatin  (CRESTOR ) 10 MG tablet Take 10 mg by mouth 3 (three) times a week.     Simethicone (GAS-X PO) Take 1 tablet by mouth as needed (Gas).     spironolactone  (ALDACTONE ) 25 MG tablet Take 1 tablet (25 mg total) by mouth daily. with food 90 tablet 1   telmisartan  (MICARDIS ) 80 MG tablet Take 1 tablet (80 mg total) by mouth daily. 90 tablet 1   tirzepatide  (MOUNJARO ) 2.5 MG/0.5ML  Pen Inject 2.5 mg into the skin once a week. 2 mL 0   TRESIBA FLEXTOUCH 200 UNIT/ML SOPN Inject 70 Units into the skin at bedtime.  0   vitamin E 400 UNIT capsule Take 400 Units by mouth daily.     zinc gluconate 50 MG tablet Take 50 mg by mouth daily.     No current facility-administered medications on file prior to visit.   Past Medical History:  Diagnosis Date   Anxiety    Aortic stenosis 10/19/2019   Arthritis    Back pain    CFS (chronic fatigue syndrome)    Constipation    Diabetes (HCC)    Edema of both lower extremities    Essential hypertension 09/28/2018   GERD (gastroesophageal reflux disease)    Glaucoma    Hyperlipidemia 09/28/2018   IBS (irritable bowel syndrome)    Joint pain    Low energy    Lymphedema of lower extremity 01/02/2019   Palpitations    Shortness of breath 09/28/2018   SOB (shortness of breath)    Stenosis of left carotid artery 09/28/2018   Swallowing difficulty    Past Surgical History:  Procedure Laterality Date   GALLBLADDER SURGERY  07/27/1988   HERNIA REPAIR  04/04/2012   HERNIA REPAIR  09/09/2015   HYESTERECTOMY  02/06/1991    Family History  Problem Relation Age of Onset   Memory loss Mother    Diabetes Mother    Colon cancer Mother    Diabetes type II Mother    High blood pressure Mother    High Cholesterol Mother    Obesity Mother    Stroke Father    Leukemia Father    High blood pressure Father    High Cholesterol Father    Heart disease Father    Sleep apnea Father     Obesity Father    Social History   Socioeconomic History   Marital status: Married    Spouse name: Butler   Number of children: Not on file   Years of education: Not on file   Highest education level: Not on file  Occupational History   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  Tobacco Use   Smoking status: Never   Smokeless tobacco: Never  Vaping Use   Vaping status: Never Used  Substance and Sexual Activity   Alcohol use: Never   Drug use: Never   Sexual activity: Not Currently  Other Topics Concern   Not on file  Social History Narrative   Not on file   Social Drivers of Health   Financial Resource Strain: Not on file  Food Insecurity: No Food Insecurity (05/02/2024)   Hunger Vital Sign    Worried About Running Out of Food in the Last Year: Never true    Ran Out of Food in the Last Year: Never true  Transportation Needs: No Transportation Needs (05/02/2024)   PRAPARE - Administrator, Civil Service (Medical): No    Lack of Transportation (Non-Medical): No  Physical Activity: Not on file  Stress: Not on file  Social Connections: Not on file    Objective:  BP 112/64   Pulse 60   Temp 97.8 F (36.6 C)   Ht 5' 4 (1.626 m)   Wt 296 lb 12.8 oz (134.6 kg)   SpO2 99%   BMI 50.95 kg/m      10/06/2024    7:54 AM 09/07/2024    4:01 PM 09/07/2024  2:00 PM  BP/Weight  Systolic BP 112 148 148  Diastolic BP 64 78 64  Wt. (Lbs) 296.8  299  BMI 50.95 kg/m2  51.32 kg/m2    Physical Exam Vitals reviewed.  Constitutional:      General: She is not in acute distress.    Appearance: Normal appearance. She is obese. She is not ill-appearing.  Eyes:     Conjunctiva/sclera: Conjunctivae normal.  Cardiovascular:     Rate and Rhythm: Normal rate and regular rhythm.     Heart sounds: Normal heart sounds. No murmur heard. Pulmonary:     Effort: Pulmonary effort is normal. No respiratory distress.     Breath sounds: Normal breath sounds.   Abdominal:     General: Bowel sounds are normal.     Palpations: Abdomen is soft.  Musculoskeletal:        General: Normal range of motion.     Cervical back: Normal range of motion.  Lymphadenopathy:     Cervical: No cervical adenopathy.  Skin:    General: Skin is warm.  Neurological:     Mental Status: She is alert and oriented to person, place, and time. Mental status is at baseline.  Psychiatric:        Mood and Affect: Mood normal.        Behavior: Behavior normal.     Diabetic foot exam was performed with the following findings:   No deformities, ulcerations, or other skin breakdown Intact posterior tibialis and dorsalis pedis pulses Unable to feel on her toes (1-6), can feel (8-10)      Lab Results  Component Value Date   WBC 6.2 07/05/2024   HGB 13.3 07/05/2024   HCT 40.8 07/05/2024   PLT 325 07/05/2024   GLUCOSE 145 (H) 07/05/2024   CHOL 127 09/05/2024   TRIG 112 09/05/2024   HDL 47 09/05/2024   LDLCALC 60 09/05/2024   ALT 23 09/05/2024   AST 19 09/05/2024   NA 140 07/05/2024   K 5.0 07/05/2024   CL 102 07/05/2024   CREATININE 1.10 (H) 07/05/2024   BUN 44 (H) 07/05/2024   CO2 21 07/05/2024   TSH 3.800 07/05/2024   HGBA1C 6.4 (H) 07/05/2024    Results for orders placed or performed in visit on 07/20/24  ALT   Collection Time: 09/05/24  9:49 AM  Result Value Ref Range   ALT 23 0 - 32 IU/L  AST   Collection Time: 09/05/24  9:49 AM  Result Value Ref Range   AST 19 0 - 40 IU/L  Lipid panel   Collection Time: 09/05/24  9:49 AM  Result Value Ref Range   Cholesterol, Total 127 100 - 199 mg/dL   Triglycerides 887 0 - 149 mg/dL   HDL 47 >60 mg/dL   VLDL Cholesterol Cal 20 5 - 40 mg/dL   LDL Chol Calc (NIH) 60 0 - 99 mg/dL   Chol/HDL Ratio 2.7 0.0 - 4.4 ratio  .  Assessment & Plan:   Assessment & Plan Hypertension associated with type 2 diabetes mellitus (HCC) Chronic Hypertension, Well controlled Variable blood pressure with recent low  diastolic pressure. History of white coat syndrome. Occasional dizziness, especially in the morning. - Consider medication adjustment if hypotension persists. BP Readings from Last 3 Encounters:  10/06/24 112/64  09/07/24 (!) 148/78  08/09/24 126/66  - Continue medication regimen - labs drawn today  Type 2 diabetes mellitus with hypoglycemia, circulatory complications, and diabetic neuropathy Experiencing nocturnal hypoglycemia with insulin regimen  adjustments causing blood sugar fluctuations. Using [place] 3 for monitoring. 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 - Continued Novolog with 5 units in the morning, 10 units at lunch, and 5 units at dinner. - Ordered [facility] for hypoglycemia management. - Requests prescription for Libre 3 to pharmacy. - Continue monitoring blood sugar levels with Libre 3. Orders:   Comprehensive metabolic panel   Hemoglobin A1c   CBC with Differential   Glucagon (GVOKE HYPOPEN 2-PACK) 0.5 MG/0.1ML SOAJ; Inject 0.5 mg into the skin daily as needed.   metFORMIN (GLUCOPHAGE) 500 MG tablet; Take 2 tablets (1,000 mg total) by mouth 2 (two) times daily.   FARXIGA 10 MG TABS tablet; Take 1 tablet (10 mg total) by mouth daily.   Urine microalbumin-creatinine with uACR  Mixed hyperlipidemia Cholesterol levels reported as good on current regimen. - Continue Crestor  three times a week and Zetia . - Follow up with lipid panel in six weeks. Last lipids Lab Results  Component Value Date   CHOL 127 09/05/2024   HDL 47 09/05/2024   LDLCALC 60 09/05/2024   TRIG 112 09/05/2024   CHOLHDL 2.7 09/05/2024  - labs recently drawn, FU in 3 months     Mass of left side of neck Massage therapist reports possible reduction in size. - Continue monitoring neck mass for changes. - Order neck ultrasound to evaluate the mass in  3 months for continued FU. - Recheck thyroid  labs for specialist Orders:   TSH+T4F+T3Free+ThyAbs+TPO+VD25-[facility]   Gastroesophageal reflux disease, unspecified whether esophagitis present Chronic GERD managed with esomeprazole. Recent symptoms possibly exacerbated by dietary choices. - Continue esomeprazole for GERD management. - Encourage dietary modifications to reduce symptoms, including smaller portions and increased protein intake. - Continue to take papaya enzyme in the morning and licorice supplements with meals  Orders:   esomeprazole (NEXIUM) 40 MG capsule; Take 1 capsule (40 mg total) by mouth daily.   Osteoarthritis of right knee, unspecified osteoarthritis type Right knee osteoarthritis Bone-on-bone contact with recent fall causing discoloration and swelling. Orthopedic consultation advised against knee replacement until BMI reduction. - Continue using heat and ice for knee pain management. - Referred to aquatic therapy for knee rehabilitation.    Morbid obesity (HCC) Morbid Obesity, managed by weight management program BMI 50.95 with slow weight loss progress. Needs to get to a certain BMI for knee surgery. Aquatic therapy considered for weight loss and balance improvement. - Referred to aquatic therapy for weight management and balance improvement.    Visit for screening mammogram  Orders:   MM 3D SCREENING MAMMOGRAM BILATERAL BREAST; Future       Meds ordered this encounter  Medications   Glucagon (GVOKE HYPOPEN 2-PACK) 0.5 MG/0.1ML SOAJ    Sig: Inject 0.5 mg into the skin daily as needed.    Dispense:  0.2 mL    Refill:  2   metFORMIN (GLUCOPHAGE) 500 MG tablet    Sig: Take 2 tablets (1,000 mg total) by mouth 2 (two) times daily.    Dispense:  360 tablet    Refill:  2   esomeprazole (NEXIUM) 40 MG capsule    Sig: Take 1 capsule (40 mg total) by mouth daily.    Dispense:  90 capsule    Refill:  1   FARXIGA 10 MG TABS tablet    Sig: Take 1 tablet (  10 mg  total) by mouth daily.    Dispense:  90 tablet    Refill:  1    Orders Placed This Encounter  Procedures   MM 3D SCREENING MAMMOGRAM BILATERAL BREAST   Comprehensive metabolic panel   Hemoglobin A1c   TSH+T4F+T3Free+ThyAbs+TPO+VD25-[facility]   CBC with Differential   Urine microalbumin-creatinine with uACR       Follow-up: Return in about 3 months (around 01/06/2025) for chronic.  An After Visit Summary was printed and given to the patient.  Total time spent on today's visit was 45 minutes, including both face-to-face time and nonface-to-face time personally spent on review of chart (labs and imaging), discussing labs and goals, discussing further work-up, treatment options, answering patient's questions, and coordinating care.    Harrie Cedar, FNP [facility] [phone]

## 2024-10-06 NOTE — Assessment & Plan Note (Addendum)
 Right knee osteoarthritis Bone-on-bone contact with recent fall causing discoloration and swelling. Orthopedic consultation advised against knee replacement until BMI reduction. - Continue using heat and ice for knee pain management. - Referred to aquatic therapy for knee rehabilitation.

## 2024-10-06 NOTE — Assessment & Plan Note (Addendum)
 Chronic Hypertension, Well controlled Variable blood pressure with recent low diastolic pressure. History of white coat syndrome. Occasional dizziness, especially in the morning. - Consider medication adjustment if hypotension persists. BP Readings from Last 3 Encounters:  10/06/24 112/64  09/07/24 (!) 148/78  08/09/24 126/66  - Continue medication regimen - labs drawn today  Type 2 diabetes mellitus with hypoglycemia, circulatory complications, and diabetic neuropathy Experiencing nocturnal hypoglycemia with insulin regimen adjustments causing blood sugar fluctuations. Using [place] 3 for monitoring. 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 - Continued Novolog with 5 units in the morning, 10 units at lunch, and 5 units at dinner. - Ordered [facility] for hypoglycemia management. - Requests prescription for Libre 3 to pharmacy. - Continue monitoring blood sugar levels with Libre 3. Orders:   Comprehensive metabolic panel   Hemoglobin A1c   CBC with Differential   Glucagon (GVOKE HYPOPEN 2-PACK) 0.5 MG/0.1ML SOAJ; Inject 0.5 mg into the skin daily as needed.   metFORMIN (GLUCOPHAGE) 500 MG tablet; Take 2 tablets (1,000 mg total) by mouth 2 (two) times daily.   FARXIGA 10 MG TABS tablet; Take 1 tablet (10 mg total) by mouth daily.   Urine microalbumin-creatinine with uACR

## 2024-10-06 NOTE — Assessment & Plan Note (Addendum)
 Chronic GERD managed with esomeprazole. Recent symptoms possibly exacerbated by dietary choices. - Continue esomeprazole for GERD management. - Encourage dietary modifications to reduce symptoms, including smaller portions and increased protein intake. - Continue to take papaya enzyme in the morning and licorice supplements with meals  Orders:   esomeprazole (NEXIUM) 40 MG capsule; Take 1 capsule (40 mg total) by mouth daily.

## 2024-10-06 NOTE — Patient Instructions (Signed)
  VISIT SUMMARY: Today, we discussed the management of your blood sugar levels, knee pain, and other health concerns. We made some adjustments to your insulin regimen to address your nocturnal hypoglycemia and reviewed your current medications for diabetes, hypertension, and cholesterol. We also talked about your knee pain and the steps you can take to manage it, including aquatic therapy. Additionally, we reviewed your general health maintenance, including upcoming screenings and vaccinations.  YOUR PLAN: -TYPE 2 DIABETES MELLITUS WITH HYPOGLYCEMIA, CIRCULATORY COMPLICATIONS, AND DIABETIC NEUROPATHY: Type 2 diabetes is a condition where your body does not use insulin properly, leading to high blood sugar levels. We adjusted your [place] insulin to 50 units at night and continued your Novolog regimen. We also ordered a G-Boat for managing hypoglycemia and sent a prescription for Libre 3 to the pharmacy. Please continue to monitor your blood sugar levels with the Libre 3.  -RIGHT KNEE OSTEOARTHRITIS: Osteoarthritis is a condition where the cartilage in your joints wears down, causing pain and swelling. You have bone-on-bone contact in your knee, which worsened after a fall. Continue using heat and ice for pain management, and we have referred you to aquatic therapy for knee rehabilitation.  -OBESITY: Obesity is a condition where excess body fat affects your health. Your BMI is in the 30s, and we are focusing on weight loss to improve your overall health. We have referred you to aquatic therapy to help with weight management and balance improvement.  -MIXED HYPERLIPIDEMIA: Mixed hyperlipidemia is a condition where you have high levels of different types of fats in your blood. Your cholesterol levels are good on your current regimen. Continue taking Crestor  three times a week and Zetia , and follow up with a lipid panel in six weeks.  -HYPERTENSION: Hypertension is high blood pressure, which can lead to  other health problems if not managed. Your blood pressure is well-controlled with your current medications. Continue taking amlodipine , Lasix , telmisartan , hydralazine , and Aldactone  as prescribed.  -CHRONIC LOWER EXTREMITY EDEMA: Chronic lower extremity edema is swelling in your legs, often managed with diuretics. Continue your current diuretic therapy to manage this condition.  -GASTROESOPHAGEAL REFLUX DISEASE (GERD): GERD is a condition where stomach acid frequently flows back into the tube connecting your mouth and stomach, causing discomfort. Continue your current GERD management regimen.  -LOCALIZED SWELLING AND MASS OF NECK: You have a swelling and mass in your neck that your massage therapist reports may be reducing in size. Continue to monitor this for any changes.  -GLAUCOMA: Glaucoma is a condition that damages your eye's optic nerve, often linked to high eye pressure. Continue with your regular glaucoma check-ups to monitor and manage this condition.  INSTRUCTIONS: Please follow up with a lipid panel in six weeks to ch eck your cholesterol levels. Ensure you have your regular eye exam in December for glaucoma and diabetic retinopathy surveillance. Additionally, a mammogram has been ordered, so please schedule that as well.                                              Contains text generated by [employer].

## 2024-10-06 NOTE — Assessment & Plan Note (Addendum)
  Orders:   MM 3D SCREENING MAMMOGRAM BILATERAL BREAST; Future

## 2024-10-06 NOTE — Assessment & Plan Note (Addendum)
 Morbid Obesity, managed by weight management program BMI 50.95 with slow weight loss progress. Needs to get to a certain BMI for knee surgery. Aquatic therapy considered for weight loss and balance improvement. - Referred to aquatic therapy for weight management and balance improvement.

## 2024-10-06 NOTE — Assessment & Plan Note (Addendum)
 Cholesterol levels reported as good on current regimen. - Continue Crestor  three times a week and Zetia . - Follow up with lipid panel in six weeks. Last lipids Lab Results  Component Value Date   CHOL 127 09/05/2024   HDL 47 09/05/2024   LDLCALC 60 09/05/2024   TRIG 112 09/05/2024   CHOLHDL 2.7 09/05/2024  - labs recently drawn, FU in 3 months

## 2024-10-08 ENCOUNTER — Ambulatory Visit: Payer: Self-pay | Admitting: Family Medicine

## 2024-10-08 LAB — COMPREHENSIVE METABOLIC PANEL WITH GFR
ALT: 22 IU/L (ref 0–32)
AST: 19 IU/L (ref 0–40)
Albumin: 4.3 g/dL (ref 3.8–4.8)
Alkaline Phosphatase: 72 IU/L (ref 49–135)
BUN/Creatinine Ratio: 34 — ABNORMAL HIGH (ref 12–28)
BUN: 39 mg/dL — ABNORMAL HIGH (ref 8–27)
Bilirubin Total: 0.3 mg/dL (ref 0.0–1.2)
CO2: 21 mmol/L (ref 20–29)
Calcium: 10 mg/dL (ref 8.7–10.3)
Chloride: 93 mmol/L — ABNORMAL LOW (ref 96–106)
Creatinine, Ser: 1.14 mg/dL — ABNORMAL HIGH (ref 0.57–1.00)
Globulin, Total: 2.7 g/dL (ref 1.5–4.5)
Glucose: 177 mg/dL — ABNORMAL HIGH (ref 70–99)
Potassium: 4.8 mmol/L (ref 3.5–5.2)
Sodium: 129 mmol/L — ABNORMAL LOW (ref 134–144)
Total Protein: 7 g/dL (ref 6.0–8.5)
eGFR: 51 mL/min/1.73 — ABNORMAL LOW (ref >59)

## 2024-10-08 LAB — CBC WITH DIFFERENTIAL/PLATELET
Basophils Absolute: 0 x10E3/uL (ref 0.0–0.2)
Basos: 0 %
EOS (ABSOLUTE): 0.1 x10E3/uL (ref 0.0–0.4)
Eos: 2 %
Hematocrit: 41.1 % (ref 34.0–46.6)
Hemoglobin: 13.1 g/dL (ref 11.1–15.9)
Immature Grans (Abs): 0 x10E3/uL (ref 0.0–0.1)
Immature Granulocytes: 0 %
Lymphocytes Absolute: 1.6 x10E3/uL (ref 0.7–3.1)
Lymphs: 24 %
MCH: 28.3 pg (ref 26.6–33.0)
MCHC: 31.9 g/dL (ref 31.5–35.7)
MCV: 89 fL (ref 79–97)
Monocytes Absolute: 0.7 x10E3/uL (ref 0.1–0.9)
Monocytes: 10 %
Neutrophils Absolute: 4.3 x10E3/uL (ref 1.4–7.0)
Neutrophils: 64 %
Platelets: 355 x10E3/uL (ref 150–450)
RBC: 4.63 x10E6/uL (ref 3.77–5.28)
RDW: 13.5 % (ref 11.7–15.4)
WBC: 6.8 x10E3/uL (ref 3.4–10.8)

## 2024-10-08 LAB — HEMOGLOBIN A1C
Est. average glucose Bld gHb Est-mCnc: 148 mg/dL
Hgb A1c MFr Bld: 6.8 % — ABNORMAL HIGH (ref 4.8–5.6)

## 2024-10-08 LAB — MICROALBUMIN / CREATININE URINE RATIO
Creatinine, Urine: 27.4 mg/dL
Microalb/Creat Ratio: 50 mg/g{creat} — ABNORMAL HIGH (ref 0–29)
Microalbumin, Urine: 13.6 ug/mL

## 2024-10-08 LAB — TSH+T4F+T3FREE+THYABS+TPO+VD25
Free T4: 1.39 ng/dL (ref 0.82–1.77)
T3, Free: 2.7 pg/mL (ref 2.0–4.4)
TSH: 3.39 u[IU]/mL (ref 0.450–4.500)
Thyroglobulin Antibody: <1 [IU]/mL (ref 0.0–0.9)
Thyroperoxidase Ab SerPl-aCnc: <9 [IU]/mL (ref 0–34)
Vit D, 25-Hydroxy: 45.9 ng/mL (ref 30.0–100.0)

## 2024-10-12 ENCOUNTER — Ambulatory Visit: Admitting: Nurse Practitioner

## 2024-10-12 ENCOUNTER — Encounter: Payer: Self-pay | Admitting: Nurse Practitioner

## 2024-10-12 VITALS — BP 165/67 | HR 58 | Ht 64.0 in | Wt 288.0 lb

## 2024-10-12 DIAGNOSIS — Z794 Long term (current) use of insulin: Secondary | ICD-10-CM

## 2024-10-12 DIAGNOSIS — Z6841 Body Mass Index (BMI) 40.0 and over, adult: Secondary | ICD-10-CM

## 2024-10-12 DIAGNOSIS — E1169 Type 2 diabetes mellitus with other specified complication: Secondary | ICD-10-CM | POA: Diagnosis not present

## 2024-10-12 DIAGNOSIS — E66813 Obesity, class 3: Secondary | ICD-10-CM | POA: Diagnosis not present

## 2024-10-12 DIAGNOSIS — F5089 Other specified eating disorder: Secondary | ICD-10-CM

## 2024-10-12 DIAGNOSIS — Z7984 Long term (current) use of oral hypoglycemic drugs: Secondary | ICD-10-CM

## 2024-10-12 DIAGNOSIS — Z7985 Long-term (current) use of injectable non-insulin antidiabetic drugs: Secondary | ICD-10-CM

## 2024-10-12 MED ORDER — BLOOD GLUCOSE TEST VI STRP: 1.0000 | ORAL_STRIP | 0 refills | Status: DC

## 2024-10-12 MED ORDER — LANCET DEVICE MISC: 1.0000 | 0 refills | Status: AC

## 2024-10-12 MED ORDER — BLOOD GLUCOSE MONITORING SUPPL DEVI: 1.0000 | 0 refills | Status: AC

## 2024-10-12 MED ORDER — BUPROPION HCL ER (SR) 150 MG PO TB12: 150.0000 mg | ORAL_TABLET | Freq: Every day | ORAL | 0 refills | Status: DC

## 2024-10-12 MED ORDER — LANCETS MISC: 1.0000 | 0 refills | Status: AC

## 2024-10-12 MED ORDER — TIRZEPATIDE 2.5 MG/0.5ML ~~LOC~~ SOAJ: 2.5000 mg | SUBCUTANEOUS | 0 refills | Status: DC

## 2024-10-12 NOTE — Progress Notes (Signed)
 Office: [phone]  /  Fax: [phone]  WEIGHT SUMMARY AND BIOMETRICS  Weight Lost Since Last Visit: 11lb  Weight Gained Since Last Visit: 0   Vitals BP: (!) 165/67 Pulse Rate: (!) 58 SpO2: 96 %   Anthropometric Measurements Height: 5' 4 (1.626 m) Weight: 288 lb (130.6 kg) BMI (Calculated): 49.41 Weight at Last Visit: 299lb Weight Lost Since Last Visit: 11lb Weight Gained Since Last Visit: 0 Starting Weight: 339lb Total Weight Loss (lbs): 51 lb (23.1 kg)   Body Composition  Body Fat %: 57.4 % Fat Mass (lbs): 165.6 lbs Muscle Mass (lbs): 116.6 lbs Visceral Fat Rating : 23   Other Clinical Data Fasting: no Labs: no Today's Visit #: 50 Starting Date: 01/28/21     HPI  Chief Complaint: OBESITY  Bethany Lynch is here to discuss her progress with her obesity treatment plan. She is on the the Category 2 Plan and states she is following her eating plan approximately 30 % of the time. She states she is exercising 45 minutes 2 days per week.   Interval History:  Since last office visit she has lost 11 pounds since her last visit.  She is making healthier choices and watching her portion sizes.  She is drinking more water and less diet drinks.    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  Denies side effects.     Previous pharmacotherapy for medical weight loss:  None   Bariatric surgery:  Patient has not had bariatric   Pharmacotherapy for DMT2:   She is taking Mounjaro  2.5mg , Farxiga, Metformin 1000 mg BID, Tresiba 50 units pm and Novolog 5 units BF, 10 units lunch and 5 units dinner (will skip if sugars are < 120).  Reports side effects of some nausea and constipation.  Saw cardiology last on 2/7 and PCP last on 10/06/24     Last A1c was 6.8 on 10/06/24 CBGs:  10/30 54 at 2am and 1pm 54 after eating a taco salad and candy, later was 54 then ate 2 hotdogs-didn't check sugars the rest of the day 10/31-110  and 173 with finger stick, at 1 pm 115 with sensor and 159 finger stick -average 136 On ASA 81mg  and Crestor  10mg .   Last eye exam:  Dec 16th, 2024-sched 11/15/24 -She has tried Victoza in the past and stopped due to side effects.   Lab Results  Component Value Date   HGBA1C 6.8 (H) 10/06/2024   HGBA1C 6.4 (H) 07/05/2024   HGBA1C 6.0 10/04/2023   Lab Results  Component Value Date   LDLCALC 60 09/05/2024   CREATININE 1.14 (H) 10/06/2024    PHYSICAL EXAM:  Blood pressure (!) 165/67, pulse (!) 58, height 5' 4 (1.626 m), weight 288 lb (130.6 kg), SpO2 96%. Body mass index is 49.44 kg/m.  General: She is overweight, cooperative, alert, well developed, and in no acute distress. PSYCH: Has normal mood, affect and thought process.   Extremities: No edema.  Neurologic: No gross sensory or motor deficits. No tremors or fasciculations noted.    DIAGNOSTIC DATA REVIEWED:  BMET    Component Value Date/Time   NA 129 (L) 10/06/2024 0849   K 4.8 10/06/2024 0849   CL 93 (L) 10/06/2024 0849   CO2 21 10/06/2024 0849   GLUCOSE 177 (H) 10/06/2024 0849   BUN 39 (H) 10/06/2024 0849   CREATININE 1.14 (H) 10/06/2024 0849   CALCIUM  10.0 10/06/2024 0849   GFRNONAA 44 (L) 01/21/2021  1324   GFRAA 51 (L) 01/21/2021 1324   Lab Results  Component Value Date   HGBA1C 6.8 (H) 10/06/2024   HGBA1C 6.3 (H) 01/28/2021   Lab Results  Component Value Date   INSULIN 3.5 02/23/2022   INSULIN 3.7 01/28/2021   Lab Results  Component Value Date   TSH 3.390 10/06/2024   CBC    Component Value Date/Time   WBC 6.8 10/06/2024 0849   RBC 4.63 10/06/2024 0849   HGB 13.1 10/06/2024 0849   HCT 41.1 10/06/2024 0849   PLT 355 10/06/2024 0849   MCV 89 10/06/2024 0849   MCH 28.3 10/06/2024 0849   MCHC 31.9 10/06/2024 0849   RDW 13.5 10/06/2024 0849   Iron Studies    Component Value Date/Time   FERRITIN 27 09/17/2022 1515   Lipid Panel     Component Value Date/Time   CHOL 127 09/05/2024 0949    TRIG 112 09/05/2024 0949   HDL 47 09/05/2024 0949   CHOLHDL 2.7 09/05/2024 0949   LDLCALC 60 09/05/2024 0949   Hepatic Function Panel     Component Value Date/Time   PROT 7.0 10/06/2024 0849   ALBUMIN 4.3 10/06/2024 0849   AST 19 10/06/2024 0849   ALT 22 10/06/2024 0849   ALKPHOS 72 10/06/2024 0849   BILITOT 0.3 10/06/2024 0849      Component Value Date/Time   TSH 3.390 10/06/2024 0849   Nutritional Lab Results  Component Value Date   VD25OH 45.9 10/06/2024   VD25OH 53.9 07/05/2024   VD25OH 46.3 09/17/2022     ASSESSMENT AND PLAN  TREATMENT PLAN FOR OBESITY:  Recommended Dietary Goals  Bethany Lynch is currently in the action stage of change. As such, her goal is to continue weight management plan. She has agreed to the Category 2 Plan.  Behavioral Intervention  We discussed the following Behavioral Modification Strategies today: increasing lean protein intake to established goals, decreasing simple carbohydrates , increasing vegetables, increasing fiber rich foods, increasing water intake , work on meal planning and preparation, reading food labels , keeping healthy foods at home, planning for success, continue to work on maintaining a reduced calorie state, getting the recommended amount of protein, incorporating whole foods, making healthy choices, staying well hydrated and practicing mindfulness when eating., and increase protein intake, fibrous foods (25 grams per day for women, 30 grams for men) and water to improve satiety and decrease hunger signals. .  Additional resources provided today: NA  Recommended Physical Activity Goals  Bethany Lynch has been advised to work up to 150 minutes of moderate intensity aerobic activity a week and strengthening exercises 2-3 times per week for cardiovascular health, weight loss maintenance and preservation of muscle mass.   She has agreed to Think about enjoyable ways to increase daily physical activity and overcoming barriers to  exercise, Increase physical activity in their day and reduce sedentary time (increase NEAT)., Work on scheduling and tracking physical activity. , and Combine aerobic and strengthening exercises for efficiency and improved cardiometabolic health.    ASSOCIATED CONDITIONS ADDRESSED TODAY  Action/Plan  Type 2 diabetes mellitus with other specified complication, with long-term current use of insulin (HCC) -     Tirzepatide ; Inject 2.5 mg into the skin once a week.  Dispense: 2 mL; Refill: 0 -     Blood Glucose Monitoring Suppl; 1 each by Does not apply route as directed. Dispense based on patient and insurance preference. Use up to four times daily as directed. (FOR ICD-10 E10.9, E11.9).  Dispense: 1 each; Refill: 0 -     Blood Glucose Test; 1 each by Does not apply route as directed. Dispense based on patient and insurance preference. Use up to four times daily as directed. (FOR ICD-10 E10.9, E11.9).  Dispense: 100 strip; Refill: 0 -     Lancet Device; 1 each by Does not apply route as directed. Dispense based on patient and insurance preference. Use up to four times daily as directed. (FOR ICD-10 E10.9, E11.9).  Dispense: 1 each; Refill: 0 -     Lancets; 1 each by Does not apply route as directed. Dispense based on patient and insurance preference. Use up to four times daily as directed. (FOR ICD-10 E10.9, E11.9).  Dispense: 100 each; Refill: 0  Other disorder of eating -     buPROPion  HCl ER (SR); Take 1 tablet (150 mg total) by mouth daily.  Dispense: 90 tablet; Refill: 0  Obesity, Class III, BMI 40-49.9 (morbid obesity) (HCC)      Her monitor was reading 62-64 in the office.  Finger stick was 92 in the office today.  Discussed with PCP while patient was in the office today. I don't think her Bethany Lynch 3 is reading correctly.  To use glucose monitoring.    Glucose log given to patient today   Return in about 4 weeks (around 11/09/2024).SABRA She was informed of the importance of frequent follow  up visits to maximize her success with intensive lifestyle modifications for her multiple health conditions.   ATTESTASTION STATEMENTS:  Reviewed by clinician on day of visit: allergies, medications, problem list, medical history, surgical history, family history, social history, and previous encounter notes.     Corean SAUNDERS. Gearl Kimbrough FNP-C

## 2024-10-25 ENCOUNTER — Inpatient Hospital Stay (HOSPITAL_BASED_OUTPATIENT_CLINIC_OR_DEPARTMENT_OTHER): Admission: RE | Admit: 2024-10-25 | Source: Ambulatory Visit | Admitting: Radiology

## 2024-10-31 ENCOUNTER — Telehealth: Payer: Self-pay | Admitting: Family Medicine

## 2024-10-31 NOTE — Telephone Encounter (Signed)
 [facility] Plan of Care

## 2024-11-01 ENCOUNTER — Ambulatory Visit (HOSPITAL_BASED_OUTPATIENT_CLINIC_OR_DEPARTMENT_OTHER)
Admission: RE | Admit: 2024-11-01 | Discharge: 2024-11-01 | Disposition: A | Source: Ambulatory Visit | Attending: Family Medicine | Admitting: Family Medicine

## 2024-11-01 ENCOUNTER — Encounter (HOSPITAL_BASED_OUTPATIENT_CLINIC_OR_DEPARTMENT_OTHER): Payer: Self-pay | Admitting: Radiology

## 2024-11-01 DIAGNOSIS — Z1231 Encounter for screening mammogram for malignant neoplasm of breast: Secondary | ICD-10-CM

## 2024-11-02 ENCOUNTER — Encounter: Payer: Self-pay | Admitting: Nurse Practitioner

## 2024-11-15 ENCOUNTER — Other Ambulatory Visit: Payer: Self-pay | Admitting: Family Medicine

## 2024-11-15 LAB — OPHTHALMOLOGY REPORT-SCANNED

## 2024-11-16 ENCOUNTER — Ambulatory Visit: Admitting: Bariatrics

## 2024-11-16 ENCOUNTER — Encounter: Payer: Self-pay | Admitting: Bariatrics

## 2024-11-16 VITALS — BP 164/69 | HR 64 | Ht 64.0 in | Wt 292.0 lb

## 2024-11-16 DIAGNOSIS — Z794 Long term (current) use of insulin: Secondary | ICD-10-CM | POA: Diagnosis not present

## 2024-11-16 DIAGNOSIS — E66813 Obesity, class 3: Secondary | ICD-10-CM

## 2024-11-16 DIAGNOSIS — Z7984 Long term (current) use of oral hypoglycemic drugs: Secondary | ICD-10-CM | POA: Diagnosis not present

## 2024-11-16 DIAGNOSIS — F5089 Other specified eating disorder: Secondary | ICD-10-CM | POA: Diagnosis not present

## 2024-11-16 DIAGNOSIS — Z6841 Body Mass Index (BMI) 40.0 and over, adult: Secondary | ICD-10-CM

## 2024-11-16 DIAGNOSIS — E119 Type 2 diabetes mellitus without complications: Secondary | ICD-10-CM

## 2024-11-16 DIAGNOSIS — Z7985 Long-term (current) use of injectable non-insulin antidiabetic drugs: Secondary | ICD-10-CM | POA: Diagnosis not present

## 2024-11-16 DIAGNOSIS — E1169 Type 2 diabetes mellitus with other specified complication: Secondary | ICD-10-CM

## 2024-11-16 MED ORDER — BUPROPION HCL ER (SR) 150 MG PO TB12: 150.0000 mg | ORAL_TABLET | Freq: Every day | ORAL | 0 refills | Status: AC

## 2024-11-16 MED ORDER — TIRZEPATIDE 2.5 MG/0.5ML ~~LOC~~ SOAJ: 2.5000 mg | SUBCUTANEOUS | 0 refills | Status: DC

## 2024-11-16 MED ORDER — BLOOD GLUCOSE TEST VI STRP: 1.0000 | ORAL_STRIP | 0 refills | Status: DC

## 2024-11-16 NOTE — Progress Notes (Unsigned)
° ° °                                                                                                     °    ° °  WEIGHT SUMMARY AND BIOMETRICS  Weight Lost Since Last Visit: 0  Weight Gained Since Last Visit: 4lb   Vitals BP: (!) 164/69 Pulse Rate: 64 SpO2: 95 %   Anthropometric Measurements Height: 5' 4 (1.626 m) Weight: 292 lb (132.5 kg) BMI (Calculated): 50.1 Weight at Last Visit: 288lb Weight Lost Since Last Visit: 0 Weight Gained Since Last Visit: 4lb Starting Weight: 339lb Total Weight Loss (lbs): 47 lb (21.3 kg)   Body Composition  Body Fat %: 59.4 % Fat Mass (lbs): 173.6 lbs Muscle Mass (lbs): 112.6 lbs Visceral Fat Rating : 24   Other Clinical Data Fasting: no Labs: no Today's Visit #: 3 Starting Date: 01/28/21    OBESITY Bethany Lynch is here to discuss her progress with her obesity treatment plan along with follow-up of her obesity related diagnoses.    Nutrition Plan: the Category 2 plan - 40% adherence.  Current exercise: [employer] and physical therapy.  Interim History:  She is up 4 lbs since her last visit.  Protein intake is as prescribed, Not journaling consistently., and Water intake is adequate.   Pharmacotherapy: Bethany Lynch is on {dwwpharmacotherapy:29109} Adverse side effects: {dwwse:29122} Hunger is {EWCONTROLASSESSMENT:24261}.  Cravings are {EWCONTROLASSESSMENT:24261}.  Assessment/Plan:   There are no diagnoses linked to this encounter.    {dwwmorbid:29108::Morbid Obesity}: Current BMI BMI (Calculated): 50.1   Pharmacotherapy Plan {dwwmed:29123}  {dwwpharmacotherapy:29109}  Bethany Lynch {CHL AMB IS/IS NOT:210130109} currently in the action stage of change. As such, her goal is to {MWMwtloss#1:210800005}.  She has agreed to {dwwsldiets:29085}.  Exercise goals: {MWM EXERCISE RECS:23473}  Behavioral modification strategies: {dwwslwtlossstrategies:29088}.  Bethany Lynch has agreed to follow-up with our clinic in {NUMBER 1-10:22536} weeks.   No  orders of the defined types were placed in this encounter.   There are no discontinued medications.   No orders of the defined types were placed in this encounter.     Objective:   VITALS: Per patient if applicable, see vitals. GENERAL: Alert and in no acute distress. CARDIOPULMONARY: No increased WOB. Speaking in clear sentences.  PSYCH: Pleasant and cooperative. Speech normal rate and rhythm. Affect is appropriate. Insight and judgement are appropriate. Attention is focused, linear, and appropriate.  NEURO: Oriented as arrived to appointment on time with no prompting.   Attestation Statements:   This was prepared with the assistance of Engineer, Civil (consulting).  Occasional wrong-word or sound-a-like substitutions may have occurred due to the inherent limitations of voice recognition

## 2024-11-27 ENCOUNTER — Inpatient Hospital Stay: Admission: RE | Admit: 2024-11-27

## 2024-11-28 ENCOUNTER — Other Ambulatory Visit: Payer: Self-pay | Admitting: Family Medicine

## 2024-11-28 DIAGNOSIS — F419 Anxiety disorder, unspecified: Secondary | ICD-10-CM

## 2024-12-03 ENCOUNTER — Other Ambulatory Visit: Payer: Self-pay | Admitting: Family Medicine

## 2024-12-14 ENCOUNTER — Ambulatory Visit: Admitting: Nurse Practitioner

## 2024-12-14 ENCOUNTER — Encounter: Payer: Self-pay | Admitting: Nurse Practitioner

## 2024-12-14 VITALS — BP 148/65 | HR 80 | Temp 97.7°F | Ht 64.0 in | Wt 288.0 lb

## 2024-12-14 DIAGNOSIS — Z7985 Long-term (current) use of injectable non-insulin antidiabetic drugs: Secondary | ICD-10-CM

## 2024-12-14 DIAGNOSIS — F5089 Other specified eating disorder: Secondary | ICD-10-CM

## 2024-12-14 DIAGNOSIS — E1169 Type 2 diabetes mellitus with other specified complication: Secondary | ICD-10-CM

## 2024-12-14 DIAGNOSIS — Z794 Long term (current) use of insulin: Secondary | ICD-10-CM | POA: Diagnosis not present

## 2024-12-14 DIAGNOSIS — Z6841 Body Mass Index (BMI) 40.0 and over, adult: Secondary | ICD-10-CM | POA: Diagnosis not present

## 2024-12-14 DIAGNOSIS — E66813 Obesity, class 3: Secondary | ICD-10-CM | POA: Diagnosis not present

## 2024-12-14 DIAGNOSIS — Z7984 Long term (current) use of oral hypoglycemic drugs: Secondary | ICD-10-CM | POA: Diagnosis not present

## 2024-12-14 MED ORDER — TIRZEPATIDE 2.5 MG/0.5ML ~~LOC~~ SOAJ: 2.5000 mg | SUBCUTANEOUS | 0 refills | Status: AC

## 2024-12-14 MED ORDER — BLOOD GLUCOSE TEST VI STRP: 1.0000 | ORAL_STRIP | 3 refills | Status: AC

## 2024-12-14 NOTE — Progress Notes (Signed)
 "  Office: [phone]  /  Fax: [phone]  WEIGHT SUMMARY AND BIOMETRICS  Weight Lost Since Last Visit: 4 lb  Weight Gained Since Last Visit: 0 lb   Vitals Temp: 97.7 F (36.5 C) BP: (!) 148/65 Pulse Rate: 80 SpO2: 94 %   Anthropometric Measurements Height: 5' 4 (1.626 m) Weight: 288 lb (130.6 kg) BMI (Calculated): 49.41 Weight at Last Visit: 292 lb Weight Lost Since Last Visit: 4 lb Weight Gained Since Last Visit: 0 lb Starting Weight: 339 lb Total Weight Loss (lbs): 51 lb (23.1 kg)   No data recorded Other Clinical Data Fasting: no Labs: no Today's Visit #: 52 Starting Date: 01/28/21     HPI  Chief Complaint: OBESITY  Mischa is here to discuss her progress with her obesity treatment plan. She is on the the Category 2 Plan and states she is following her eating plan approximately 50 % of the time. She states she is not exercising.   Interval History:  Since last office visit she has lost 4 pounds.  She has been watching her portion sizes and making healthier choices.  She eats out 4 times per week and splits her meals with her husband.  She has not been cooking more at home.  She snacks on a apple and candy daily.    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  Denies side effects.     Previous pharmacotherapy for medical weight loss:  None   Bariatric surgery:  Patient has not had bariatric   Pharmacotherapy for DMT2:   She is taking Mounjaro  2.5mg , Farxiga , Metformin  1000 mg BID, Tresiba 50 units pm and Novolog 8 units BF, 8 units lunch and 0 units dinner.  Denies side effects.   Saw cardiology last on 2/7 and PCP last on 10/06/24     Last A1c was 6.8 on 10/06/24 CBGs:  127-231-one low 54 since last visit On ASA 81mg  and Crestor  10mg .   Last eye exam:  11/15/24 -She has tried Victoza in the past and stopped due to side effects.  Lab Results  Component Value Date   HGBA1C 6.8 (H) 10/06/2024    HGBA1C 6.4 (H) 07/05/2024   HGBA1C 6.0 10/04/2023   Lab Results  Component Value Date   LDLCALC 60 09/05/2024   CREATININE 1.14 (H) 10/06/2024       PHYSICAL EXAM:  Blood pressure (!) 148/65, pulse 80, temperature 97.7 F (36.5 C), height 5' 4 (1.626 m), weight 288 lb (130.6 kg), SpO2 94%. Body mass index is 49.44 kg/m.  General: She is overweight, cooperative, alert, well developed, and in no acute distress. PSYCH: Has normal mood, affect and thought process.   Extremities: No edema.  Neurologic: No gross sensory or motor deficits. No tremors or fasciculations noted.    DIAGNOSTIC DATA REVIEWED:  BMET    Component Value Date/Time   NA 129 (L) 10/06/2024 0849   K 4.8 10/06/2024 0849   CL 93 (L) 10/06/2024 0849   CO2 21 10/06/2024 0849   GLUCOSE 177 (H) 10/06/2024 0849   BUN 39 (H) 10/06/2024 0849   CREATININE 1.14 (H) 10/06/2024 0849   CALCIUM  10.0 10/06/2024 0849   GFRNONAA 44 (L) 01/21/2021 1324   GFRAA 51 (L) 01/21/2021 1324   Lab Results  Component Value Date   HGBA1C 6.8 (H) 10/06/2024   HGBA1C 6.3 (H) 01/28/2021   Lab Results  Component Value Date   INSULIN 3.5 02/23/2022   INSULIN  3.7 01/28/2021   Lab Results  Component Value Date   TSH 3.390 10/06/2024   CBC    Component Value Date/Time   WBC 6.8 10/06/2024 0849   RBC 4.63 10/06/2024 0849   HGB 13.1 10/06/2024 0849   HCT 41.1 10/06/2024 0849   PLT 355 10/06/2024 0849   MCV 89 10/06/2024 0849   MCH 28.3 10/06/2024 0849   MCHC 31.9 10/06/2024 0849   RDW 13.5 10/06/2024 0849   Iron Studies    Component Value Date/Time   FERRITIN 27 09/17/2022 1515   Lipid Panel     Component Value Date/Time   CHOL 127 09/05/2024 0949   TRIG 112 09/05/2024 0949   HDL 47 09/05/2024 0949   CHOLHDL 2.7 09/05/2024 0949   LDLCALC 60 09/05/2024 0949   Hepatic Function Panel     Component Value Date/Time   PROT 7.0 10/06/2024 0849   ALBUMIN 4.3 10/06/2024 0849   AST 19 10/06/2024 0849   ALT 22  10/06/2024 0849   ALKPHOS 72 10/06/2024 0849   BILITOT 0.3 10/06/2024 0849      Component Value Date/Time   TSH 3.390 10/06/2024 0849   Nutritional Lab Results  Component Value Date   VD25OH 45.9 10/06/2024   VD25OH 53.9 07/05/2024   VD25OH 46.3 09/17/2022     ASSESSMENT AND PLAN  TREATMENT PLAN FOR OBESITY:  Recommended Dietary Goals  Kade is currently in the action stage of change. As such, her goal is to continue weight management plan. She has agreed to the Category 2 Plan.  Behavioral Intervention  We discussed the following Behavioral Modification Strategies today: increasing lean protein intake to established goals, decreasing simple carbohydrates , increasing vegetables, increasing fiber rich foods, increasing water intake , reading food labels , keeping healthy foods at home, planning for success, better snacking choices, continue to work on maintaining a reduced calorie state, getting the recommended amount of protein, incorporating whole foods, making healthy choices, staying well hydrated and practicing mindfulness when eating., and increase protein intake, fibrous foods (25 grams per day for women, 30 grams for men) and water to improve satiety and decrease hunger signals. .  Additional resources provided today: NA  Recommended Physical Activity Goals  Rivky has been advised to work up to 150 minutes of moderate intensity aerobic activity a week and strengthening exercises 2-3 times per week for cardiovascular health, weight loss maintenance and preservation of muscle mass.   She has agreed to Think about enjoyable ways to increase daily physical activity and overcoming barriers to exercise, Increase physical activity in their day and reduce sedentary time (increase NEAT)., Work on scheduling and tracking physical activity. , and Combine aerobic and strengthening exercises for efficiency and improved cardiometabolic health.   Pharmacotherapy We discussed  various medication options to help Bluma with her weight loss efforts and we both agreed to continue Wellbutrin  SR 150mg .  Side effects discussed.  ASSOCIATED CONDITIONS ADDRESSED TODAY  Action/Plan  Type 2 diabetes mellitus with other specified complication, with long-term current use of insulin (HCC) -     Tirzepatide ; Inject 2.5 mg into the skin once a week.  Dispense: 2 mL; Refill: 0 -     Blood Glucose Test; 1 each by Does not apply route as directed. Dispense based on patient and insurance preference. Use up to four times daily as directed. (FOR ICD-10 E10.9, E11.9).  Dispense: 100 strip; Refill: 3  Other disorder of eating Continue Wellbutrin , doing well  Obesity, Class III, BMI 40-49.9 (morbid  obesity) (HCC)      Will obtain IC at next visit  She is scheduled to see her PCP for follow up on 01/08/25  Goals:  Decrease eating out [place] more at home Increase water intake exercise   Return in about 4 weeks (around 01/11/2025).SABRA She was informed of the importance of frequent follow up visits to maximize her success with intensive lifestyle modifications for her multiple health conditions.   ATTESTASTION STATEMENTS:  Reviewed by clinician on day of visit: allergies, medications, problem list, medical history, surgical history, family history, social history, and previous encounter notes.     Corean SAUNDERS. Carmel Garfield FNP-C  "

## 2024-12-16 ENCOUNTER — Other Ambulatory Visit: Payer: Self-pay | Admitting: Cardiology

## 2024-12-17 ENCOUNTER — Other Ambulatory Visit: Payer: Self-pay | Admitting: Family Medicine

## 2024-12-17 ENCOUNTER — Other Ambulatory Visit: Payer: Self-pay | Admitting: Nurse Practitioner

## 2024-12-17 DIAGNOSIS — E1159 Type 2 diabetes mellitus with other circulatory complications: Secondary | ICD-10-CM

## 2024-12-17 DIAGNOSIS — E1169 Type 2 diabetes mellitus with other specified complication: Secondary | ICD-10-CM

## 2024-12-18 ENCOUNTER — Encounter: Payer: Self-pay | Admitting: Family Medicine

## 2024-12-18 ENCOUNTER — Ambulatory Visit: Admitting: Family Medicine

## 2024-12-18 ENCOUNTER — Ambulatory Visit: Payer: Self-pay

## 2024-12-18 VITALS — BP 144/54 | HR 88 | Temp 98.0°F | Resp 16 | Ht 64.0 in | Wt 296.6 lb

## 2024-12-18 DIAGNOSIS — I10 Essential (primary) hypertension: Secondary | ICD-10-CM | POA: Insufficient documentation

## 2024-12-18 DIAGNOSIS — R062 Wheezing: Secondary | ICD-10-CM | POA: Diagnosis not present

## 2024-12-18 DIAGNOSIS — J0110 Acute frontal sinusitis, unspecified: Secondary | ICD-10-CM | POA: Diagnosis not present

## 2024-12-18 DIAGNOSIS — B9689 Other specified bacterial agents as the cause of diseases classified elsewhere: Secondary | ICD-10-CM | POA: Insufficient documentation

## 2024-12-18 DIAGNOSIS — R051 Acute cough: Secondary | ICD-10-CM | POA: Diagnosis not present

## 2024-12-18 MED ORDER — BENZONATATE 200 MG PO CAPS: 200.0000 mg | ORAL_CAPSULE | Freq: Three times a day (TID) | ORAL | 0 refills | Status: AC | PRN

## 2024-12-18 MED ORDER — AMOXICILLIN-POT CLAVULANATE 875-125 MG PO TABS: 1.0000 | ORAL_TABLET | Freq: Two times a day (BID) | ORAL | 0 refills | Status: AC

## 2024-12-18 MED ORDER — ALBUTEROL SULFATE HFA 108 (90 BASE) MCG/ACT IN AERS: 2.0000 | INHALATION_SPRAY | Freq: Four times a day (QID) | RESPIRATORY_TRACT | 2 refills | Status: AC | PRN

## 2024-12-18 NOTE — Assessment & Plan Note (Signed)
 Acute sinusitis with cough and wheezing Acute sinusitis with cough and wheezing. Differential includes bronchitis and pneumonia. Symptoms suggest need for broader antibiotic coverage. - Prescribed Augmentin  after completing current amoxicillin . - Advised to report if symptoms worsen or do not improve, chest x-ray may be needed. Orders:   amoxicillin -clavulanate (AUGMENTIN ) 875-125 MG tablet; Take 1 tablet by mouth 2 (two) times daily for 7 days.

## 2024-12-18 NOTE — Assessment & Plan Note (Signed)
 Persistent cough since last Sunday. Tessalon  Perles work well for her cough. - Prescribed Tessalon  Perles for cough. Orders:   benzonatate  (TESSALON ) 200 MG capsule; Take 1 capsule (200 mg total) by mouth 3 (three) times daily as needed for cough.

## 2024-12-18 NOTE — Patient Instructions (Signed)
" °  VISIT SUMMARY: During your visit, we discussed your persistent cough and symptoms that suggest bronchitis. We also reviewed your history of hypertension and how it may be affected by your current illness and medications.  YOUR PLAN: -ACUTE SINUSITIS WITH COUGH AND WHEEZING: Acute sinusitis is an infection of the sinuses that can cause symptoms like cough and wheezing. We have prescribed Augmentin  to take after you finish your current course of amoxicillin . You should also continue using Tessalon  Perles for your cough and use the albuterol  inhaler as needed every six hours for wheezing. Please let us  know if your symptoms worsen or do not improve, as a chest x-ray may be needed.  -ESSENTIAL HYPERTENSION: Hypertension is high blood pressure, which can be affected by illness and medications. We recommend that you monitor your blood pressure at home and report any elevated readings. We checked your blood pressure before you left the clinic.  INSTRUCTIONS: Please follow up if your symptoms worsen or do not improve. Continue to monitor your blood pressure at home and report any elevated readings.    Contains text generated by [employer].   "

## 2024-12-18 NOTE — Telephone Encounter (Signed)
 FYI Only or Action Required?: FYI only for provider: appointment scheduled on today.  Patient was last seen in primary care on 12/14/2024 by Becki Krabbe, FNP.  Called Nurse Triage reporting Cough and neck and back pain.  Symptoms began a week ago.  Interventions attempted: OTC medications: Coricidin HBP, , Prescription medications: tessalon  perels, and Rest, hydration, or home remedies.  Symptoms are: gradually worsening.  Triage Disposition: Home Care  Patient/caregiver understands and will follow disposition?: yes pt requesting appt         Message from [facility] sent at 12/18/2024 11:26 AM EST  Coughing for over a week, wheezing, sob. Sore throat.  ----- Message from [place] B sent at 12/18/2024 11:26 AM EST ----- Reason for Triage: Coughing for over a week, wheezing, sob. Sore throat.   Reason for Disposition  Cough  Answer Assessment - Initial Assessment Questions 1. ONSET: When did the cough begin?      Over a week last Sunday night  2. SEVERITY: How bad is the cough today?      Coughing episodes every 10 minutes - cannot lay flat  3. SPUTUM: Describe the color of your sputum (e.g., none, dry cough; clear, white, yellow, green)     White  4. HEMOPTYSIS: Are you coughing up any blood? If Yes, ask: How much? (e.g., flecks, streaks, tablespoons, etc.)     no 5. DIFFICULTY BREATHING: Are you having difficulty breathing? If Yes, ask: How bad is it? (e.g., mild, moderate, severe)      No except after coughing 6. FEVER: Do you have a fever? If Yes, ask: What is your temperature, how was it measured, and when did it start?     no 7. CARDIAC HISTORY: Do you have any history of heart disease? (e.g., heart attack, congestive heart failure)      CAD 8. LUNG HISTORY: Do you have any history of lung disease?  (e.g., pulmonary embolus, asthma, emphysema)     No  9. PE RISK FACTORS: Do you have a history of blood clots? (or: recent major surgery,  recent prolonged travel, bedridden)     no 10. OTHER SYMPTOMS: Do you have any other symptoms? (e.g., runny nose, wheezing, chest pain)       Sore ness to back of neck and entire back  Protocols used: Cough - Acute Productive-A-AH

## 2024-12-18 NOTE — Assessment & Plan Note (Signed)
 Essential hypertension Hypertension with recent elevated readings, possibly exacerbated by current illness and medication use. BP Readings from Last 3 Encounters:  12/18/24 (!) 144/54  12/14/24 (!) 148/65  11/16/24 (!) 164/69  - Advised to monitor blood pressure at home and report if elevated. - Checked blood pressure before leaving clinic.

## 2024-12-18 NOTE — Progress Notes (Signed)
 "  Acute Office Visit  Subjective:    Patient ID: Bethany Lynch, female    DOB: 12-01-1950, 74 y.o.   MRN: [id number]  Chief Complaint  Patient presents with   Cough    Discussed the use of AI scribe software for clinical note transcription with the patient, who gave verbal consent to proceed.  History of Present Illness   Bethany Lynch is a 74 year old female who presents with a persistent cough and symptoms suggestive of bronchitis.  Cough and respiratory symptoms - Persistent cough for one week, onset after exposure to husband with cold that progressed to bronchitis  - Cough produces a wheezy sound, especially when lying down or during coughing fits - No history of COPD or asthma - No significant nasal discharge; sinus drainage occurs down the throat - Self-medicating with over-the-counter Coricidin hbp - Max Strength and Tessalon  Perles - Uses vaporizer for symptom relief  Oropharyngeal and otologic symptoms - Sore throat present - Ear pain present - Difficulty swallowing  Systemic and constitutional symptoms - No fever - Chills, body aches, and fatigue present - Loss of appetite - Difficulty sleeping, averaging five hours per night per Fitbit - Recent fever blister, now resolved  Sinus symptoms - Sinus pain and pressure behind the eyes  Gastrointestinal symptoms - Constipation attributed to medication side effects - Uses Miralax for relief  Medication use and adverse effects - Currently taking amoxicillin  for dental issue - Avoids Mucinex due to prior adverse effects - Cautious with medications due to history of hypertension  Hypertension - History of high blood pressure - Cautious with medication selection to avoid exacerbation of hypertension  - Elevated at healthy weight clinic, recheck was much lower before leaving.      Past Medical History:  Diagnosis Date   Anxiety    Aortic stenosis 10/19/2019   Arthritis    Back pain    CFS (chronic fatigue  syndrome)    Constipation    Diabetes (HCC)    Edema of both lower extremities    Essential hypertension 09/28/2018   GERD (gastroesophageal reflux disease)    Glaucoma    Hyperlipidemia 09/28/2018   IBS (irritable bowel syndrome)    Joint pain    Low energy    Lymphedema of lower extremity 01/02/2019   Palpitations    Shortness of breath 09/28/2018   SOB (shortness of breath)    Stenosis of left carotid artery 09/28/2018   Swallowing difficulty     Past Surgical History:  Procedure Laterality Date   GALLBLADDER SURGERY  07/27/1988   HERNIA REPAIR  04/04/2012   HERNIA REPAIR  09/09/2015   HYESTERECTOMY  02/06/1991    Family History  Problem Relation Age of Onset   Memory loss Mother    Diabetes Mother    Colon cancer Mother    Diabetes type II Mother    High blood pressure Mother    High Cholesterol Mother    Obesity Mother    Stroke Father    Leukemia Father    High blood pressure Father    High Cholesterol Father    Heart disease Father    Sleep apnea Father    Obesity Father     Social History   Socioeconomic History   Marital status: Married    Spouse name: Butler   Number of children: Not on file   Years of education: Not on file   Highest education level: Not on file  Occupational History   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  Tobacco Use   Smoking status: Never   Smokeless tobacco: Never  Vaping Use   Vaping status: Never Used  Substance and Sexual Activity   Alcohol use: Never   Drug use: Never   Sexual activity: Not Currently  Other Topics Concern   Not on file  Social History Narrative   Not on file   Social Drivers of Health   Tobacco Use: Low Risk (12/18/2024)   Patient History    Smoking Tobacco Use: Never    Smokeless Tobacco Use: Never    Passive Exposure: Not on file  Financial Resource Strain: Not on file  Food Insecurity: No Food Insecurity (05/02/2024)   Hunger Vital Sign    Worried About Running Out of  Food in the Last Year: Never true    Ran Out of Food in the Last Year: Never true  Transportation Needs: No Transportation Needs (05/02/2024)   PRAPARE - Administrator, Civil Service (Medical): No    Lack of Transportation (Non-Medical): No  Physical Activity: Not on file  Stress: Not on file  Social Connections: Not on file  Intimate Partner Violence: Not At Risk (05/02/2024)   Humiliation, Afraid, Rape, and Kick questionnaire    Fear of Current or Ex-Partner: No    Emotionally Abused: No    Physically Abused: No    Sexually Abused: No  Depression (PHQ2-9): Low Risk (10/06/2024)   Depression (PHQ2-9)    PHQ-2 Score: 0  Alcohol Screen: Not on file  Housing: Low Risk (05/02/2024)   Housing Stability Vital Sign    Unable to Pay for Housing in the Last Year: No    Number of Times Moved in the Last Year: 0    Homeless in the Last Year: No  Utilities: Not At Risk (05/02/2024)   [facility] Utilities    Threatened with loss of utilities: No  Health Literacy: Not on file    Outpatient Medications Prior to Visit  Medication Sig Dispense Refill   amLODipine  (NORVASC ) 5 MG tablet Take 1 tablet (5 mg total) by mouth daily. 90 tablet 2   amoxicillin  (AMOXIL ) 875 MG tablet Take 875 mg by mouth 2 (two) times daily.     aspirin 81 MG chewable tablet Chew 81 mg by mouth daily.     Blood Glucose Monitoring Suppl DEVI 1 each by Does not apply route as directed. Dispense based on patient and insurance preference. Use up to four times daily as directed. (FOR ICD-10 E10.9, E11.9). 1 each 0   buPROPion  (WELLBUTRIN  SR) 150 MG 12 hr tablet Take 1 tablet (150 mg total) by mouth daily. 90 tablet 0   Calcium  Carb-Cholecalciferol (CALCIUM  500/VITAMIN D ) 500-3.125 MG-MCG TABS Take 1 each by mouth daily.     cetirizine (ZYRTEC) 10 MG tablet Take 10 mg by mouth daily.     Cholecalciferol 50 MCG (2000 UT) CAPS Take 1 capsule by mouth daily as needed (Patient determines when to take).     Continuous Glucose Sensor  (FREESTYLE LIBRE 3 PLUS SENSOR) MISC CHANGE SENSOR EVERY 15 DAYS. 6 each 3   dicyclomine  (BENTYL ) 20 MG tablet TAKE 1 TABLET (20 MG TOTAL) BY MOUTH EVERY 6 (SIX) HOURS AS NEEDED 360 tablet 1   doxycycline (PERIOSTAT) 20 MG tablet Take 20 mg by mouth 2 (two) times daily.     esomeprazole  (NEXIUM ) 40 MG capsule Take 1 capsule (40 mg total) by mouth daily. 90 capsule 1   ezetimibe  (  ZETIA ) 10 MG tablet Take 1 tablet (10 mg total) by mouth daily. 90 tablet 3   FARXIGA  10 MG TABS tablet Take 1 tablet (10 mg total) by mouth daily. 90 tablet 1   furosemide  (LASIX ) 40 MG tablet TAKE 1 TABLET BY MOUTH EVERY DAY 90 tablet 0   gabapentin  (NEURONTIN ) 100 MG capsule TAKE 1 CAPSULE (100 MG TOTAL) BY MOUTH 3 (THREE) TIMES DAILY AS NEEDED (NERVE PAIN). 90 capsule 3   Glucagon  (GVOKE HYPOPEN  2-PACK) 0.5 MG/0.1ML SOAJ Inject 0.5 mg into the skin daily as needed. 0.2 mL 2   Glucose Blood (BLOOD GLUCOSE TEST STRIPS) STRP 1 each by Does not apply route as directed. Dispense based on patient and insurance preference. Use up to four times daily as directed. (FOR ICD-10 E10.9, E11.9). 100 strip 3   hydrALAZINE  (APRESOLINE ) 50 MG tablet TAKE 1 TABLET BY MOUTH THREE TIMES A DAY 270 tablet 1   Lancet Device MISC 1 each by Does not apply route as directed. Dispense based on patient and insurance preference. Use up to four times daily as directed. (FOR ICD-10 E10.9, E11.9). 1 each 0   Lancets Misc. (ACCU-CHEK FASTCLIX LANCET) KIT 1 EACH BY DOES NOT APPLY ROUTE AS DIRECTED. DISPENSE BASED ON PATIENT AND INSURANCE PREFERENCE. USE UP TO FOUR TIMES DAILY AS DIRECTED. (FOR ICD-10 E10.9, E11.9). 1 kit 0   Lancets MISC 1 each by Does not apply route as directed. Dispense based on patient and insurance preference. Use up to four times daily as directed. (FOR ICD-10 E10.9, E11.9). 100 each 0   LORazepam  (ATIVAN ) 0.5 MG tablet TAKE 1 TABLET BY MOUTH TWICE A DAY AS NEEDED FOR ANXIETY 30 tablet 2   MAGNESIUM  PO Take by mouth.     metFORMIN   (GLUCOPHAGE ) 500 MG tablet Take 2 tablets (1,000 mg total) by mouth 2 (two) times daily. 360 tablet 2   Misc Natural Products (TART CHERRY ADVANCED PO) Take 1 tablet by mouth daily as needed (Patient determines when to take).     Multiple Vitamin (MULTIVITAMIN) tablet Take 1 tablet by mouth daily.     Multiple Vitamins-Minerals (VISION PLUS PO) Take 1 tablet by mouth daily.     NON FORMULARY Take 1 tablet by mouth See admin instructions. ORGANIC TUMERIC CURCUMIN  WITH GINGER AND BIOPERINE DIETARY SUPPLEMENT TAKE AS NEEDED     NOVOLOG FLEXPEN 100 UNIT/ML FlexPen Inject 5 Units into the muscle 3 (three) times daily with meals.  1   Omega-3 Fatty Acids (FISH OIL PO) Take 1,400 mg by mouth daily.     ondansetron  (ZOFRAN ) 4 MG tablet Take 1 tablet (4 mg total) by mouth every 8 (eight) hours as needed for nausea or vomiting. 20 tablet 1   OVER THE COUNTER MEDICATION Take 1 tablet by mouth as needed (loose stools).     rosuvastatin  (CRESTOR ) 10 MG tablet Take 10 mg by mouth 3 (three) times a week.     Simethicone (GAS-X PO) Take 1 tablet by mouth as needed (Gas).     spironolactone  (ALDACTONE ) 25 MG tablet Take 1 tablet (25 mg total) by mouth daily. with food 90 tablet 1   telmisartan  (MICARDIS ) 80 MG tablet Take 1 tablet (80 mg total) by mouth daily. 90 tablet 1   tirzepatide  (MOUNJARO ) 2.5 MG/0.5ML Pen Inject 2.5 mg into the skin once a week. 2 mL 0   TRESIBA FLEXTOUCH 200 UNIT/ML SOPN Inject 70 Units into the skin at bedtime.  0   vitamin E 400 UNIT  capsule Take 400 Units by mouth daily.     zinc gluconate 50 MG tablet Take 50 mg by mouth daily.     No facility-administered medications prior to visit.    Allergies[1]  Review of Systems  Constitutional:  Positive for activity change, appetite change, chills (achy) and fatigue. Negative for diaphoresis and fever.  HENT:  Positive for congestion, ear pain, postnasal drip, rhinorrhea, sinus pressure, sinus pain (behind her eyes), sore throat and  trouble swallowing.   Eyes: Negative.   Respiratory:  Positive for cough (dry), chest tightness, shortness of breath and wheezing.   Cardiovascular:  Negative for chest pain.  Gastrointestinal:  Negative for abdominal pain, constipation, nausea and vomiting.  Endocrine: Negative.   Genitourinary:  Negative for dysuria.  Musculoskeletal:  Negative for arthralgias.  Allergic/Immunologic: Negative.   Neurological:  Positive for headaches. Negative for dizziness, weakness and light-headedness.  Hematological: Negative.   Psychiatric/Behavioral:  Negative for dysphoric mood. The patient is not nervous/anxious.        Objective:        12/18/2024    2:41 PM 12/18/2024    2:00 PM 12/14/2024    2:48 PM  Vitals with BMI  Height  5' 4   Weight  296 lbs 10 oz   BMI  50.89   Systolic 144 168 851  Diastolic 54 66 65  Pulse  88     No data found.   Physical Exam Vitals reviewed.  Constitutional:      General: She is not in acute distress.    Appearance: Normal appearance. She is obese. She is ill-appearing.  HENT:     Right Ear: Tenderness present. There is no impacted cerumen. Tympanic membrane is erythematous (slightly).     Left Ear: Tenderness present. There is no impacted cerumen. Tympanic membrane is erythematous (slightly).     Nose: Congestion and rhinorrhea present.     Right Turbinates: Swollen.     Left Turbinates: Swollen.     Right Sinus: Maxillary sinus tenderness and frontal sinus tenderness present.     Left Sinus: Maxillary sinus tenderness and frontal sinus tenderness present.     Mouth/Throat:     Pharynx: Posterior oropharyngeal erythema present.  Eyes:     Conjunctiva/sclera: Conjunctivae normal.  Cardiovascular:     Rate and Rhythm: Normal rate and regular rhythm.     Heart sounds: Normal heart sounds. No murmur heard. Pulmonary:     Effort: Pulmonary effort is normal. No respiratory distress.     Breath sounds: Examination of the right-upper field  reveals wheezing. Examination of the left-upper field reveals wheezing. Examination of the right-lower field reveals rhonchi. Examination of the left-lower field reveals rhonchi. Wheezing and rhonchi present.  Abdominal:     General: Bowel sounds are normal.     Palpations: Abdomen is soft.     Tenderness: There is no abdominal tenderness.  Musculoskeletal:        General: Normal range of motion.     Cervical back: Tenderness present.  Neurological:     Mental Status: She is alert and oriented to person, place, and time. Mental status is at baseline.  Psychiatric:        Mood and Affect: Mood normal.        Behavior: Behavior normal.     Health Maintenance Due  Topic Date Due   Medicare Annual Wellness (AWV)  Never done   Hepatitis C Screening  Never done   COVID-19 Vaccine (4 - 2025-26  season) 07/31/2024    There are no preventive care reminders to display for this patient.   Lab Results  Component Value Date   TSH 3.390 10/06/2024   Lab Results  Component Value Date   WBC 6.8 10/06/2024   HGB 13.1 10/06/2024   HCT 41.1 10/06/2024   MCV 89 10/06/2024   PLT 355 10/06/2024   Lab Results  Component Value Date   NA 129 (L) 10/06/2024   K 4.8 10/06/2024   CO2 21 10/06/2024   GLUCOSE 177 (H) 10/06/2024   BUN 39 (H) 10/06/2024   CREATININE 1.14 (H) 10/06/2024   BILITOT 0.3 10/06/2024   ALKPHOS 72 10/06/2024   AST 19 10/06/2024   ALT 22 10/06/2024   PROT 7.0 10/06/2024   ALBUMIN 4.3 10/06/2024   CALCIUM  10.0 10/06/2024   EGFR 51 (L) 10/06/2024   Lab Results  Component Value Date   CHOL 127 09/05/2024   Lab Results  Component Value Date   HDL 47 09/05/2024   Lab Results  Component Value Date   LDLCALC 60 09/05/2024   Lab Results  Component Value Date   TRIG 112 09/05/2024   Lab Results  Component Value Date   CHOLHDL 2.7 09/05/2024   Lab Results  Component Value Date   HGBA1C 6.8 (H) 10/06/2024        Results for orders placed or performed in  visit on 11/15/24  OPHTHALMOLOGY REPORT-SCANNED   Collection Time: 11/15/24  2:45 PM  Result Value Ref Range   HM Diabetic Eye Exam No Retinopathy No Retinopathy   A Comment       Assessment & Plan:   Assessment & Plan Acute non-recurrent frontal sinusitis Acute sinusitis with cough and wheezing Acute sinusitis with cough and wheezing. Differential includes bronchitis and pneumonia. Symptoms suggest need for broader antibiotic coverage. - Prescribed Augmentin  after completing current amoxicillin . - Advised to report if symptoms worsen or do not improve, chest x-ray may be needed. Orders:   amoxicillin -clavulanate (AUGMENTIN ) 875-125 MG tablet; Take 1 tablet by mouth 2 (two) times daily for 7 days.  Acute cough Persistent cough since last Sunday. Tessalon  Perles work well for her cough. - Prescribed Tessalon  Perles for cough. Orders:   benzonatate  (TESSALON ) 200 MG capsule; Take 1 capsule (200 mg total) by mouth 3 (three) times daily as needed for cough.  Wheezing Acute wheezing on auscultation of the lungs - Albuterol  ordered - Inhale 2 puffs into the lungs every 6 hours as needed. Declined spacer for easier administration Orders:   albuterol  (VENTOLIN  HFA) 108 (90 Base) MCG/ACT inhaler; Inhale 2 puffs into the lungs every 6 (six) hours as needed for wheezing or shortness of breath.  Essential hypertension Essential hypertension Hypertension with recent elevated readings, possibly exacerbated by current illness and medication use. BP Readings from Last 3 Encounters:  12/18/24 (!) 144/54  12/14/24 (!) 148/65  11/16/24 (!) 164/69  - Advised to monitor blood pressure at home and report if elevated. - Checked blood pressure before leaving clinic.      Follow-up: Return if symptoms worsen or fail to improve.  An After Visit Summary was printed and given to the patient.  Harrie Cedar, FNP [facility] [phone]      [1]  Allergies Allergen Reactions    Liraglutide Other (See Comments)    Causes issues with pancrease Causes issues with pancrease   "

## 2024-12-18 NOTE — Assessment & Plan Note (Signed)
 Acute wheezing on auscultation of the lungs - Albuterol  ordered - Inhale 2 puffs into the lungs every 6 hours as needed. Declined spacer for easier administration Orders:   albuterol  (VENTOLIN  HFA) 108 (90 Base) MCG/ACT inhaler; Inhale 2 puffs into the lungs every 6 (six) hours as needed for wheezing or shortness of breath.

## 2024-12-30 ENCOUNTER — Other Ambulatory Visit: Payer: Self-pay | Admitting: Family Medicine

## 2024-12-30 DIAGNOSIS — E1159 Type 2 diabetes mellitus with other circulatory complications: Secondary | ICD-10-CM

## 2025-01-08 ENCOUNTER — Ambulatory Visit: Admitting: Family Medicine

## 2025-01-11 ENCOUNTER — Ambulatory Visit: Admitting: Nurse Practitioner
